# Patient Record
Sex: Female | Born: 1990 | Race: Black or African American | Hispanic: No | Marital: Single | Smoking: Former smoker
Health system: Southern US, Community
[De-identification: ages and names within clinical notes are randomized; demographics above are authoritative.]

## PROBLEM LIST (undated history)

## (undated) ENCOUNTER — Inpatient Hospital Stay (HOSPITAL_COMMUNITY): Payer: Self-pay

## (undated) ENCOUNTER — Emergency Department (HOSPITAL_COMMUNITY): Payer: Self-pay | Source: Home / Self Care

## (undated) DIAGNOSIS — Z789 Other specified health status: Secondary | ICD-10-CM

---
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
## 2010-01-05 ENCOUNTER — Emergency Department (HOSPITAL_COMMUNITY): Admission: EM | Admit: 2010-01-05 | Discharge: 2010-01-05 | Payer: Self-pay | Admitting: Family Medicine

## 2010-01-22 ENCOUNTER — Emergency Department (HOSPITAL_COMMUNITY): Admission: EM | Admit: 2010-01-22 | Discharge: 2010-01-22 | Payer: Self-pay | Admitting: Family Medicine

## 2010-03-02 ENCOUNTER — Emergency Department (HOSPITAL_COMMUNITY): Admission: EM | Admit: 2010-03-02 | Discharge: 2010-03-02 | Payer: Self-pay | Admitting: Family Medicine

## 2010-06-21 ENCOUNTER — Emergency Department (HOSPITAL_COMMUNITY)
Admission: EM | Admit: 2010-06-21 | Discharge: 2010-06-21 | Payer: Self-pay | Source: Home / Self Care | Admitting: Emergency Medicine

## 2010-07-19 ENCOUNTER — Emergency Department (HOSPITAL_COMMUNITY)
Admission: EM | Admit: 2010-07-19 | Discharge: 2010-07-19 | Payer: Self-pay | Source: Home / Self Care | Admitting: Emergency Medicine

## 2010-10-03 ENCOUNTER — Other Ambulatory Visit: Payer: Self-pay | Admitting: Family Medicine

## 2010-10-03 DIAGNOSIS — Z3689 Encounter for other specified antenatal screening: Secondary | ICD-10-CM

## 2010-10-03 LAB — URINALYSIS, ROUTINE W REFLEX MICROSCOPIC
Leukocytes, UA: NEGATIVE
Nitrite: NEGATIVE
Protein, ur: 30 mg/dL — AB
Urobilinogen, UA: 0.2 mg/dL (ref 0.0–1.0)

## 2010-10-03 LAB — URINE MICROSCOPIC-ADD ON

## 2010-10-03 LAB — ABO/RH: ABO/RH(D): A POS

## 2010-10-07 ENCOUNTER — Ambulatory Visit (HOSPITAL_COMMUNITY)
Admission: RE | Admit: 2010-10-07 | Discharge: 2010-10-07 | Disposition: A | Discharge status: Home / Self Care | Payer: Self-pay | Source: Ambulatory Visit | Attending: Family Medicine | Admitting: Family Medicine

## 2010-10-07 DIAGNOSIS — Z1389 Encounter for screening for other disorder: Secondary | ICD-10-CM | POA: Insufficient documentation

## 2010-10-07 DIAGNOSIS — O358XX0 Maternal care for other (suspected) fetal abnormality and damage, not applicable or unspecified: Secondary | ICD-10-CM | POA: Insufficient documentation

## 2010-10-07 DIAGNOSIS — Z363 Encounter for antenatal screening for malformations: Secondary | ICD-10-CM | POA: Insufficient documentation

## 2010-10-07 DIAGNOSIS — Z3689 Encounter for other specified antenatal screening: Secondary | ICD-10-CM

## 2010-10-07 LAB — POCT PREGNANCY, URINE: Preg Test, Ur: NEGATIVE

## 2010-10-09 LAB — URINE CULTURE

## 2010-10-09 LAB — POCT URINALYSIS DIP (DEVICE)
Bilirubin Urine: NEGATIVE
Glucose, UA: NEGATIVE mg/dL
Nitrite: NEGATIVE

## 2010-10-09 LAB — POCT RAPID STREP A (OFFICE): Streptococcus, Group A Screen (Direct): POSITIVE — AB

## 2010-10-26 LAB — POCT URINALYSIS DIP (DEVICE)
Bilirubin Urine: NEGATIVE
Ketones, ur: NEGATIVE mg/dL
Protein, ur: NEGATIVE mg/dL
Specific Gravity, Urine: 1.015 (ref 1.005–1.030)
pH: 7 (ref 5.0–8.0)

## 2010-10-26 LAB — POCT PREGNANCY, URINE: Preg Test, Ur: NEGATIVE

## 2011-12-29 IMAGING — US US OB DETAIL+14 WK
1 series · 14 of 28 positions shown · non-contrast
Comparison: none

[Series 1: us ob detail +14 wk · 58 acquisitions, 14 frames shown]
[im 3/58]
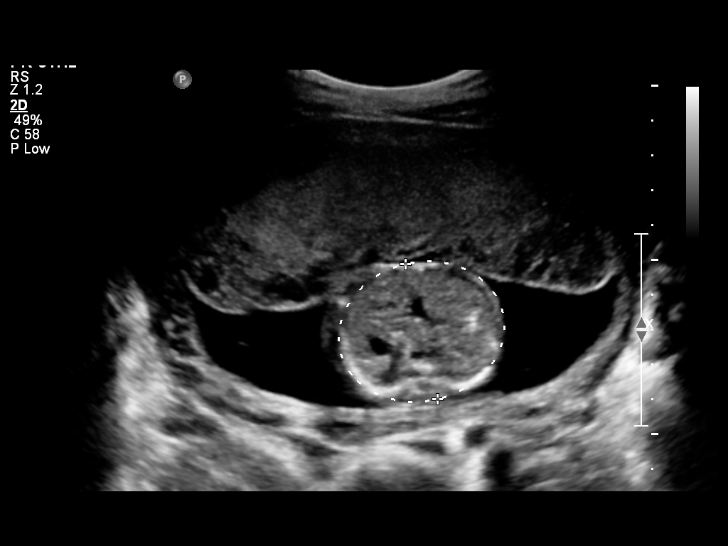
[im 7/58]
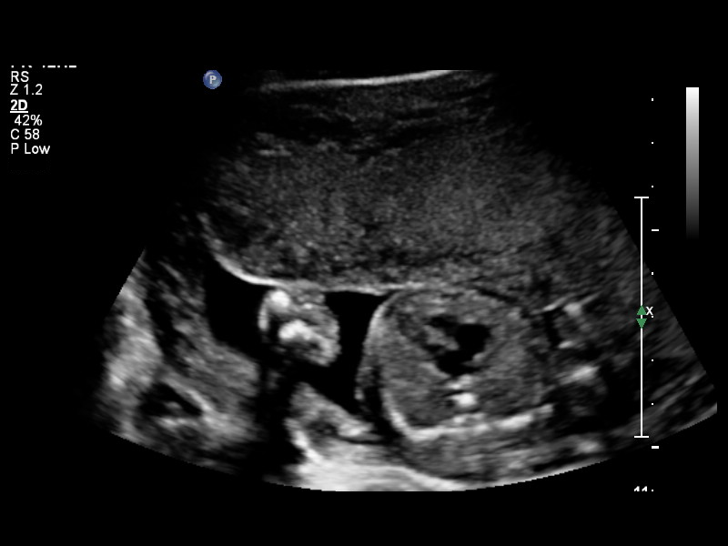
[im 11/58]
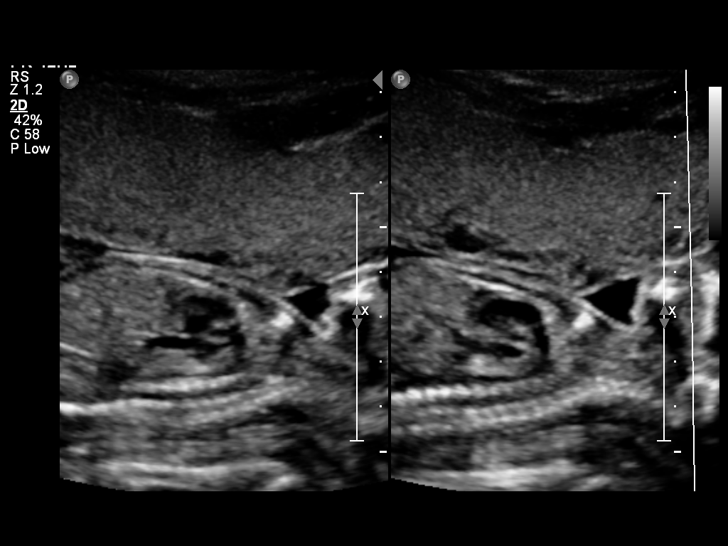
[im 15/58]
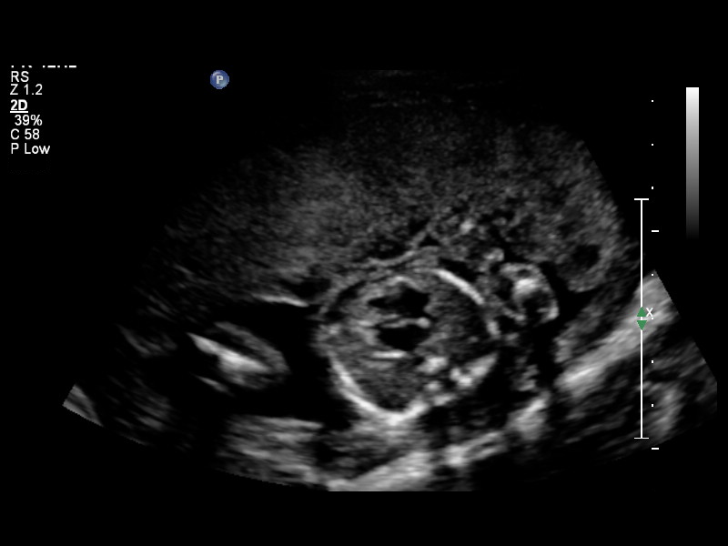
[im 20/58]
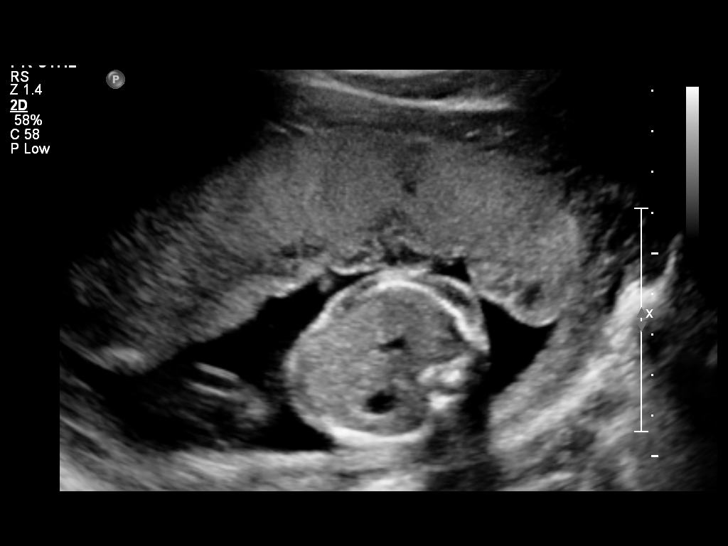
[im 24/58]
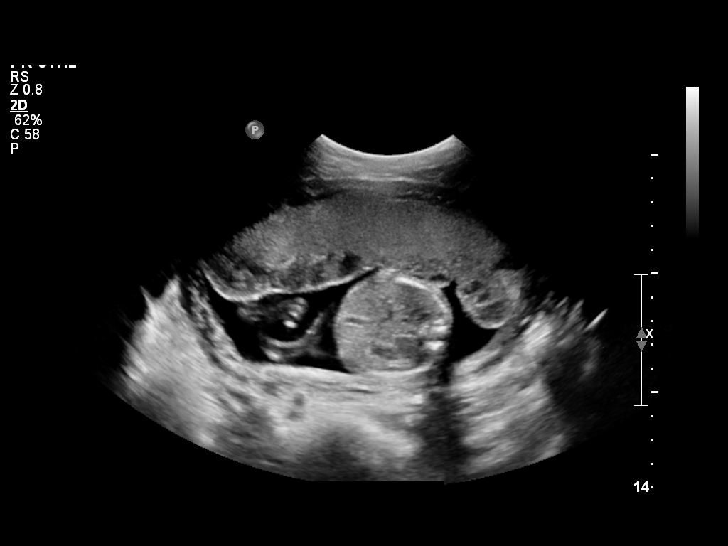
[im 28/58]
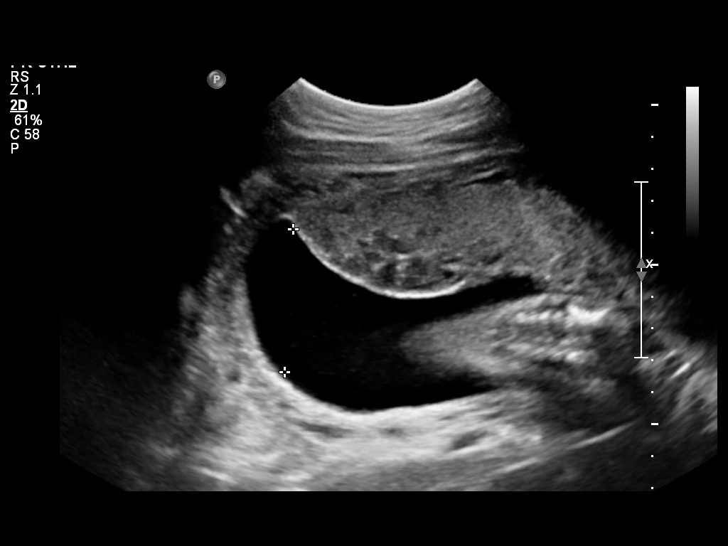
[im 32/58]
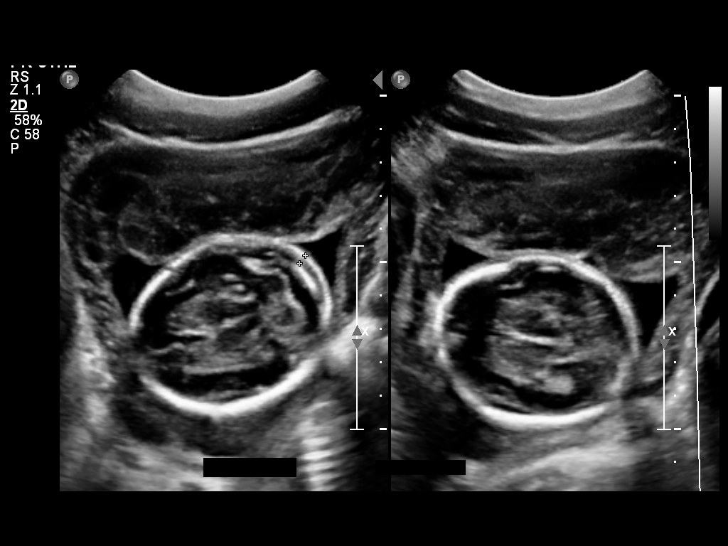
[im 36/58]
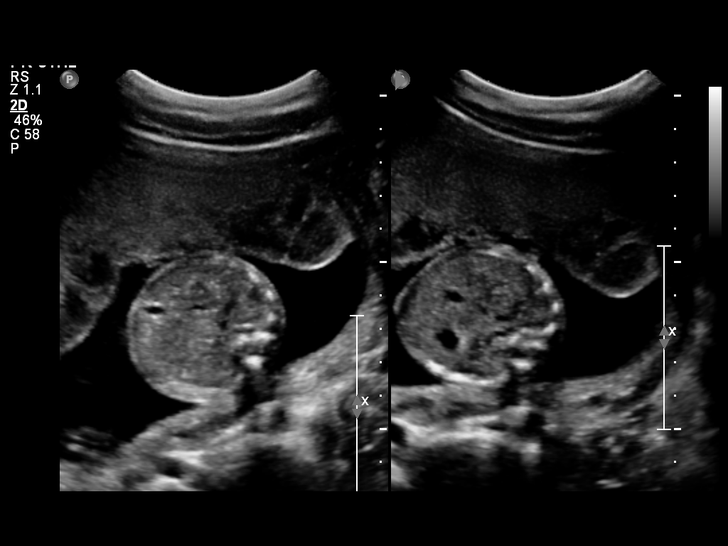
[im 41/58]
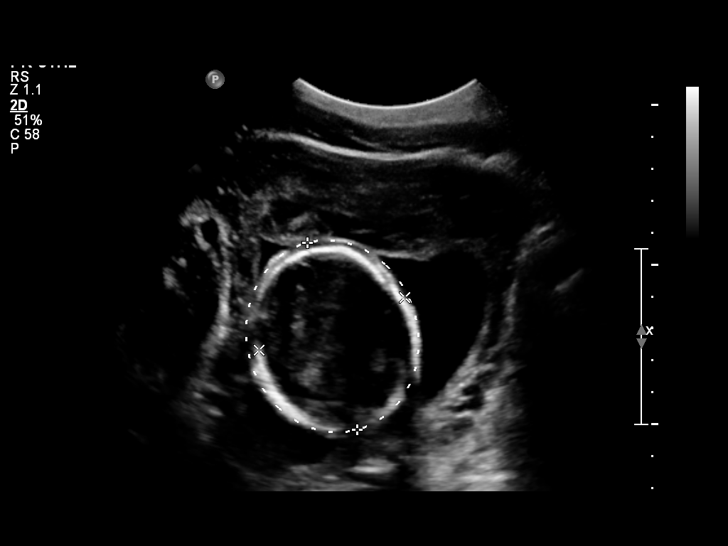
[im 45/58]
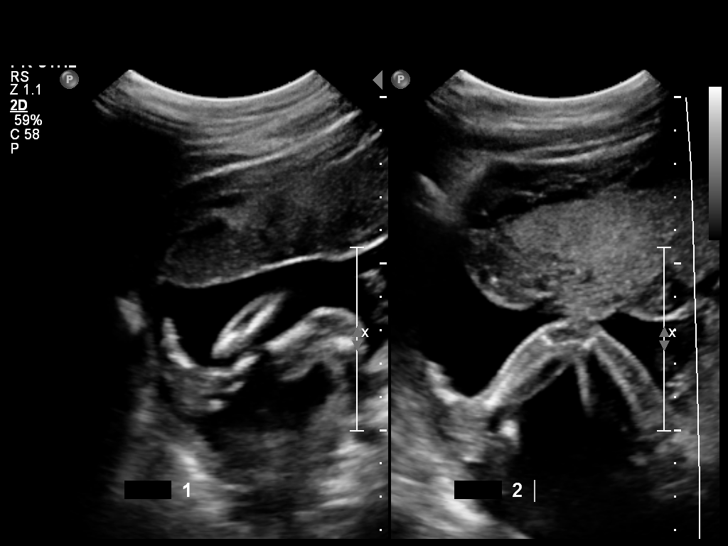
[im 49/58]
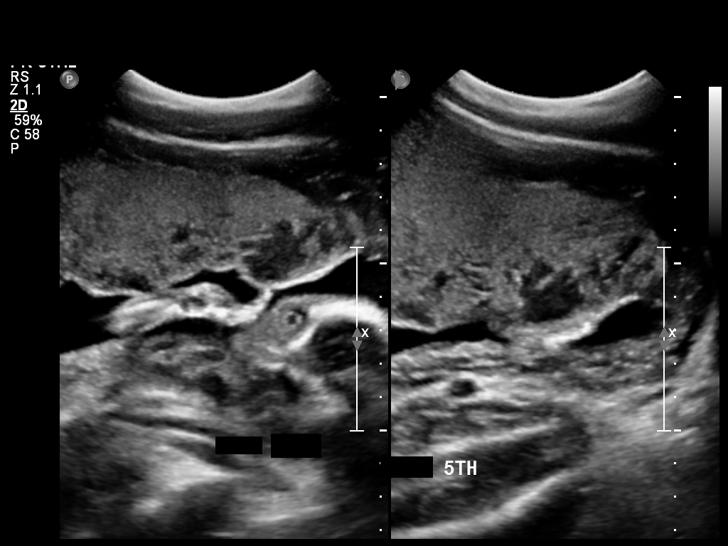
[im 53/58]
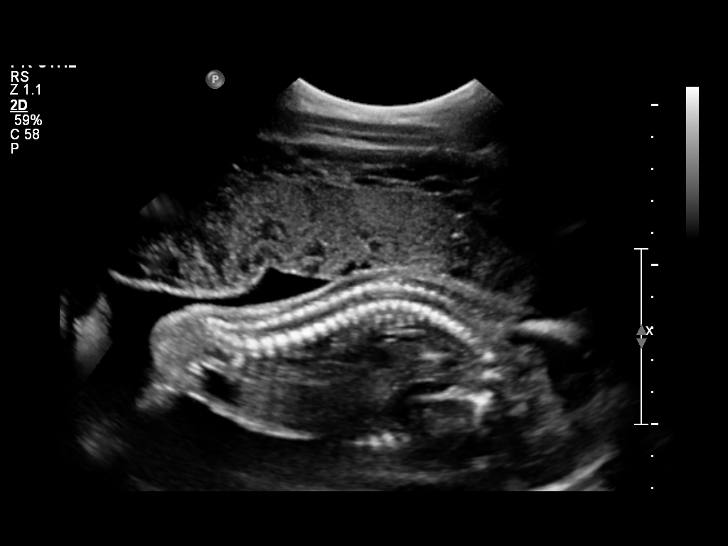
[im 58/58]
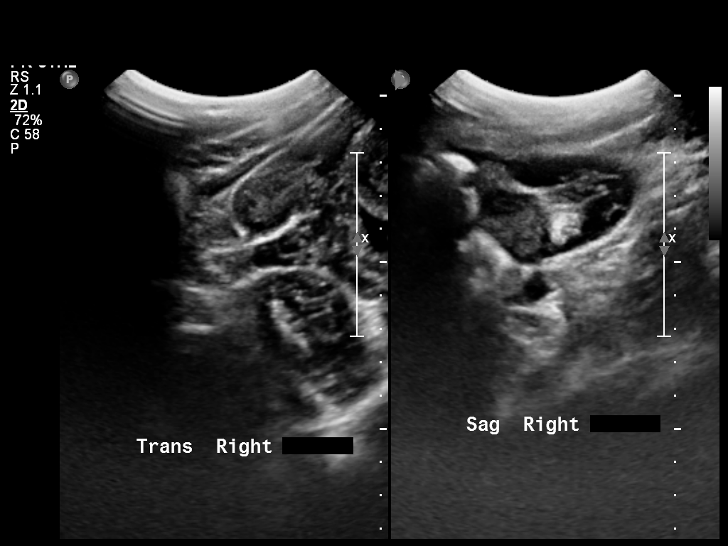

[14 of 28 positions shown; findings below may reference images not displayed]

Canned report from images found in remote index.

Refer to host system for actual result text.

## 2012-07-23 ENCOUNTER — Emergency Department (HOSPITAL_COMMUNITY): Payer: Self-pay

## 2012-07-23 ENCOUNTER — Emergency Department (HOSPITAL_COMMUNITY)
Admission: EM | Admit: 2012-07-23 | Discharge: 2012-07-24 | Disposition: A | Discharge status: Home / Self Care | Payer: Self-pay | Attending: Emergency Medicine | Admitting: Emergency Medicine

## 2012-07-23 ENCOUNTER — Encounter (HOSPITAL_COMMUNITY): Payer: Self-pay | Admitting: *Deleted

## 2012-07-23 DIAGNOSIS — S52121A Displaced fracture of head of right radius, initial encounter for closed fracture: Secondary | ICD-10-CM

## 2012-07-23 DIAGNOSIS — S52123A Displaced fracture of head of unspecified radius, initial encounter for closed fracture: Secondary | ICD-10-CM | POA: Insufficient documentation

## 2012-07-23 DIAGNOSIS — W19XXXA Unspecified fall, initial encounter: Secondary | ICD-10-CM | POA: Insufficient documentation

## 2012-07-23 DIAGNOSIS — Y929 Unspecified place or not applicable: Secondary | ICD-10-CM | POA: Insufficient documentation

## 2012-07-23 DIAGNOSIS — Y939 Activity, unspecified: Secondary | ICD-10-CM | POA: Insufficient documentation

## 2012-07-23 LAB — BASIC METABOLIC PANEL
Chloride: 104 mEq/L (ref 96–112)
Creatinine, Ser: 0.72 mg/dL (ref 0.50–1.10)
GFR calc Af Amer: >90 mL/min (ref >90)
Potassium: 3 mEq/L — ABNORMAL LOW (ref 3.5–5.1)
Sodium: 134 mEq/L — ABNORMAL LOW (ref 135–145)

## 2012-07-23 LAB — CBC
MCV: 87.9 fL (ref 78.0–100.0)
Platelets: 193 10*3/uL (ref 150–400)
RDW: 13.5 % (ref 11.5–15.5)
WBC: 8.7 10*3/uL (ref 4.0–10.5)

## 2012-07-23 MED ORDER — OXYCODONE-ACETAMINOPHEN 5-325 MG PO TABS: 1.0000 | ORAL_TABLET | ORAL | Status: DC | PRN

## 2012-07-23 MED ORDER — OXYCODONE-ACETAMINOPHEN 5-325 MG PO TABS
1.0000 | ORAL_TABLET | Freq: Once | ORAL | Status: AC
  Administered 2012-07-23: 1 via ORAL

## 2012-07-23 MED ORDER — INDOMETHACIN ER 75 MG PO CPCR: 75.0000 mg | ORAL_CAPSULE | Freq: Every day | ORAL | Status: DC

## 2012-07-23 MED ORDER — SODIUM CHLORIDE 0.9 % IV SOLN
INTRAVENOUS | Status: DC
  Administered 2012-07-23: 22:00:00 via INTRAVENOUS

## 2012-07-23 MED ORDER — MORPHINE SULFATE 4 MG/ML IJ SOLN
6.0000 mg | Freq: Once | INTRAMUSCULAR | Status: AC
  Administered 2012-07-23: 6 mg via INTRAVENOUS
  Filled 2012-07-23: qty 2

## 2012-07-23 MED ORDER — ONDANSETRON HCL 4 MG/2ML IJ SOLN
INTRAMUSCULAR | Status: AC
  Administered 2012-07-23: 4 mg via INTRAVENOUS
  Filled 2012-07-23: qty 2

## 2012-07-23 MED ORDER — OXYCODONE-ACETAMINOPHEN 5-325 MG PO TABS
ORAL_TABLET | ORAL | Status: AC
  Filled 2012-07-23: qty 1

## 2012-07-23 MED ORDER — ONDANSETRON HCL 4 MG/2ML IJ SOLN
4.0000 mg | Freq: Once | INTRAMUSCULAR | Status: AC
  Administered 2012-07-23: 4 mg via INTRAVENOUS

## 2012-07-23 NOTE — ED Provider Notes (Signed)
History     CSN: [id number]  Arrival date & time 07/23/12  4098   First MD Initiated Contact with Patient 07/23/12 2044      Chief Complaint  Patient presents with  . Elbow Injury    The history is provided by the patient.   patient reports slipping and falling back onto an outstretched right arm.  She reports pain to severe in her right elbow and right shoulder.  The majority of the pain is located in her right elbow.  She denies weakness or numbness of her upper extremity.  The pain is moderate to severe.  The pain is worsened by movement and palpation.  Nothing improves the pain except for isolation of the elbow.  No head injury  History reviewed. No pertinent past medical history.  History reviewed. No pertinent past surgical history.  History reviewed. No pertinent family history.  History  Substance Use Topics  . Smoking status: Not on file  . Smokeless tobacco: Not on file  . Alcohol Use: Not on file    OB History    Grav Para Term Preterm Abortions TAB SAB Ect Mult Living                  Review of Systems  All other systems reviewed and are negative.    Allergies  Review of patient's allergies indicates no known allergies.  Home Medications   Current Outpatient Rx  Name  Route  Sig  Dispense  Refill  . ACETAMINOPHEN 500 MG PO TABS   Oral   Take 1,000 mg by mouth every 6 (six) hours as needed.         . INDOMETHACIN ER 75 MG PO CPCR   Oral   Take 1 capsule (75 mg total) by mouth daily with breakfast.   15 capsule   0   . OXYCODONE-ACETAMINOPHEN 5-325 MG PO TABS   Oral   Take 1 tablet by mouth every 4 (four) hours as needed for pain.   40 tablet   0     BP 119/73  Pulse 88  Temp 98.2 F (36.8 C) (Oral)  Resp 18  SpO2 100%  LMP 07/17/2012  Physical Exam  Nursing note and vitals reviewed. Constitutional: She is oriented to person, place, and time. She appears well-developed and well-nourished. No distress.  HENT:  Head:  Normocephalic and atraumatic.  Eyes: EOM are normal.  Neck: Normal range of motion.  Cardiovascular: Normal rate, regular rhythm and normal heart sounds.   Pulmonary/Chest: Effort normal and breath sounds normal.  Abdominal: Soft. She exhibits no distension. There is no tenderness.  Musculoskeletal:       Significant swelling about her right elbow.  Normal right radial pulse.  Normal sensation in her right hand.  Normal grip strength in her right hand.  Significant pain in her right elbow with pronation and supination of her right hand.  Full range of motion right shoulder  Neurological: She is alert and oriented to person, place, and time.  Skin: Skin is warm and dry.  Psychiatric: She has a normal mood and affect. Judgment normal.    ED Course  Procedures (including critical care time)  Labs Reviewed  CBC - Abnormal; Notable for the following:    HCT 35.5 (*)     All other components within normal limits  BASIC METABOLIC PANEL - Abnormal; Notable for the following:    Sodium 134 (*)     Potassium 3.0 (*)  All other components within normal limits   Dg Shoulder Right  07/23/2012  *RADIOLOGY REPORT*  Clinical Data: Fall.  Shoulder injury and pain.  RIGHT SHOULDER - 2+ VIEW  Comparison:  None.  Findings:  There is no evidence of fracture or dislocation.  There is no evidence of arthropathy or other focal bone abnormality. Soft tissues are unremarkable.  IMPRESSION: Negative.   Original Report Authenticated By: Myles Rosenthal, M.D.    Dg Elbow Complete Right  07/23/2012  *RADIOLOGY REPORT*  Clinical Data: Fall.  Elbow injury and pain.  Comminuted fracture  RIGHT ELBOW - COMPLETE 3+ VIEW  Comparison: None.  Findings: Comminuted and displaced fracture is seen involving the radial head and neck.  No other fractures are identified.  Large elbow joint effusion noted.  IMPRESSION: Comminuted and displaced fracture of the radial head and neck.   Original Report Authenticated By: Myles Rosenthal, M.D.       1. Right radial head fracture       MDM  I spoke with the hand surgeon Dr. Amanda Pea personally evaluated the x-rays at home.  He requests the patient be placed in a splint and followup in his office in 2 days to discuss and likely schedule surgery for her right radial head fracture.  Requests a CT scan of her right elbow.  He also requests [place] and the patient home with oxycodone as well as indomethacin.  The patient understands the importance of following up with the hand surgeon and the need for surgery.  Her pain is controlled at this time.  Splint placed in the emergency department by orthopedic tech.        Lyanne Co, MD 07/23/12 [phone]

## 2012-07-23 NOTE — ED Notes (Signed)
Pt fell on R arm in her driveway. Pt c/o pain to R shoulder and R elbow. Pt states she fell this afternoon. CSMF intact to R arm.

## 2012-07-23 NOTE — ED Notes (Signed)
[id number]<WG> Expected date:<BR> Expected time:<BR> Means of arrival:<BR> Comments:<BR> Hold triage 5

## 2012-07-23 NOTE — ED Notes (Signed)
Ice pack provided in triage

## 2012-07-24 MED ORDER — MORPHINE SULFATE 4 MG/ML IJ SOLN
4.0000 mg | Freq: Once | INTRAMUSCULAR | Status: AC
  Administered 2012-07-24: 4 mg via INTRAVENOUS
  Filled 2012-07-24: qty 1

## 2012-07-24 NOTE — ED Notes (Signed)
Discharge instructions reviewed w/ pt., verbalizes understanding. Two prescriptions provided at discharge. 

## 2012-07-25 NOTE — Pre-Procedure Instructions (Addendum)
20 Renae Schonberger  07/25/2012   Your procedure is scheduled on:  Saturday July 27, 2012  Report to [facility] at 06:00 AM.  Call this number if you have problems the morning of surgery: [phone]   Remember:   Do not eat food or drink:After Midnight.    Take these medicines the morning of surgery with A SIP OF WATER: tylenol or percocet,    Do not wear jewelry, make-up or nail polish.  Do not wear lotions, powders, or perfumes.  Do not shave 48 hours prior to surgery. .  Do not bring valuables to the hospital.  Contacts, dentures or bridgework may not be worn into surgery.  Leave suitcase in the car. After surgery it may be brought to your room.  For patients admitted to the hospital, checkout time is 11:00 AM the day of discharge.   Patients discharged the day of surgery will not be allowed to drive home.  Name and phone number of your driver: family / friend  Special Instructions: Shower using CHG 2 nights before surgery and the night before surgery.  If you shower the day of surgery use CHG.  Use special wash - you have one bottle of CHG for all showers.  You should use approximately 1/3 of the bottle for each shower.   Please read over the following fact sheets that you were given: Pain Booklet, Coughing and Deep Breathing, Total Joint Packet and MRSA Information

## 2012-07-26 ENCOUNTER — Encounter (HOSPITAL_COMMUNITY): Payer: Self-pay | Admitting: Respiratory Therapy

## 2012-07-26 ENCOUNTER — Encounter (HOSPITAL_COMMUNITY)
Admission: RE | Admit: 2012-07-26 | Discharge: 2012-07-26 | Disposition: A | Discharge status: Home / Self Care | Payer: Self-pay | Source: Ambulatory Visit | Attending: Orthopedic Surgery | Admitting: Orthopedic Surgery

## 2012-07-26 ENCOUNTER — Encounter (HOSPITAL_COMMUNITY): Payer: Self-pay

## 2012-07-26 LAB — SURGICAL PCR SCREEN
MRSA, PCR: NEGATIVE
Staphylococcus aureus: NEGATIVE

## 2012-07-26 LAB — TYPE AND SCREEN: Antibody Screen: NEGATIVE

## 2012-07-26 NOTE — Progress Notes (Addendum)
Left message with Dr. Carlos Levering office and requested that orders be placed in computer. Per Revonda Standard, [place] will obtain T&S and will not repeat BMET

## 2012-07-27 ENCOUNTER — Encounter (HOSPITAL_COMMUNITY)
Admission: RE | Disposition: A | Discharge status: Home / Self Care | Payer: Self-pay | Source: Ambulatory Visit | Attending: Orthopedic Surgery

## 2012-07-27 ENCOUNTER — Encounter (HOSPITAL_COMMUNITY): Payer: Self-pay | Admitting: Anesthesiology

## 2012-07-27 ENCOUNTER — Ambulatory Visit (HOSPITAL_COMMUNITY): Payer: Self-pay

## 2012-07-27 ENCOUNTER — Ambulatory Visit (HOSPITAL_COMMUNITY): Payer: Self-pay | Admitting: Anesthesiology

## 2012-07-27 ENCOUNTER — Observation Stay (HOSPITAL_COMMUNITY)
Admission: RE | Admit: 2012-07-27 | Discharge: 2012-07-29 | Disposition: A | Discharge status: Home / Self Care | Payer: Self-pay | Source: Ambulatory Visit | Attending: Orthopedic Surgery | Admitting: Orthopedic Surgery

## 2012-07-27 DIAGNOSIS — Z01812 Encounter for preprocedural laboratory examination: Secondary | ICD-10-CM | POA: Insufficient documentation

## 2012-07-27 DIAGNOSIS — S53196A Other dislocation of unspecified ulnohumeral joint, initial encounter: Secondary | ICD-10-CM | POA: Insufficient documentation

## 2012-07-27 DIAGNOSIS — F17200 Nicotine dependence, unspecified, uncomplicated: Secondary | ICD-10-CM | POA: Insufficient documentation

## 2012-07-27 DIAGNOSIS — X58XXXA Exposure to other specified factors, initial encounter: Secondary | ICD-10-CM | POA: Insufficient documentation

## 2012-07-27 DIAGNOSIS — S52009A Unspecified fracture of upper end of unspecified ulna, initial encounter for closed fracture: Principal | ICD-10-CM | POA: Insufficient documentation

## 2012-07-27 HISTORY — PX: ORIF RADIAL FRACTURE: SHX5113

## 2012-07-27 LAB — CBC WITH DIFFERENTIAL/PLATELET
Basophils Absolute: 0 10*3/uL (ref 0.0–0.1)
Basophils Relative: 0 % (ref 0–1)
Eosinophils Absolute: 0.5 10*3/uL (ref 0.0–0.7)
Eosinophils Relative: 6 % — ABNORMAL HIGH (ref 0–5)
HCT: 36.4 % (ref 36.0–46.0)
Lymphocytes Relative: 10 % — ABNORMAL LOW (ref 12–46)
MCH: 30.2 pg (ref 26.0–34.0)
MCHC: 34.6 g/dL (ref 30.0–36.0)
MCV: 87.3 fL (ref 78.0–100.0)
Monocytes Absolute: 0.5 10*3/uL (ref 0.1–1.0)
Platelets: 196 10*3/uL (ref 150–400)
RDW: 13.3 % (ref 11.5–15.5)
WBC: 7.7 10*3/uL (ref 4.0–10.5)

## 2012-07-27 LAB — BASIC METABOLIC PANEL
CO2: 21 mEq/L (ref 19–32)
Calcium: 9 mg/dL (ref 8.4–10.5)
Creatinine, Ser: 0.78 mg/dL (ref 0.50–1.10)
GFR calc non Af Amer: >90 mL/min (ref >90)
Sodium: 134 mEq/L — ABNORMAL LOW (ref 135–145)

## 2012-07-27 LAB — URINALYSIS, ROUTINE W REFLEX MICROSCOPIC
Glucose, UA: NEGATIVE mg/dL
Ketones, ur: NEGATIVE mg/dL
Protein, ur: NEGATIVE mg/dL
Urobilinogen, UA: 0.2 mg/dL (ref 0.0–1.0)

## 2012-07-27 LAB — URINE MICROSCOPIC-ADD ON

## 2012-07-27 SURGERY — OPEN REDUCTION INTERNAL FIXATION (ORIF) RADIAL FRACTURE
Site: Elbow | Laterality: Right | Wound class: Clean

## 2012-07-27 MED ORDER — DEXTROSE 5 % IV SOLN
500.0000 mg | Freq: Four times a day (QID) | INTRAVENOUS | Status: DC | PRN
  Filled 2012-07-27: qty 5

## 2012-07-27 MED ORDER — ROCURONIUM BROMIDE 100 MG/10ML IV SOLN
INTRAVENOUS | Status: DC | PRN
  Administered 2012-07-27: 40 mg via INTRAVENOUS

## 2012-07-27 MED ORDER — HYDROMORPHONE HCL 2 MG PO TABS
2.0000 mg | ORAL_TABLET | ORAL | Status: DC | PRN
  Administered 2012-07-27 – 2012-07-29 (×9): 2 mg via ORAL
  Filled 2012-07-27 (×9): qty 1

## 2012-07-27 MED ORDER — CEFAZOLIN SODIUM-DEXTROSE 2-3 GM-% IV SOLR
INTRAVENOUS | Status: DC | PRN
  Administered 2012-07-27: 2 g via INTRAVENOUS

## 2012-07-27 MED ORDER — LACTATED RINGERS IV SOLN
INTRAVENOUS | Status: DC | PRN
  Administered 2012-07-27: 10:00:00 via INTRAVENOUS

## 2012-07-27 MED ORDER — BUPIVACAINE HCL (PF) 0.25 % IJ SOLN
INTRAMUSCULAR | Status: AC
  Filled 2012-07-27: qty 30

## 2012-07-27 MED ORDER — NEOSTIGMINE METHYLSULFATE 1 MG/ML IJ SOLN
INTRAMUSCULAR | Status: DC | PRN
  Administered 2012-07-27: 2 mg via INTRAVENOUS

## 2012-07-27 MED ORDER — CHLORHEXIDINE GLUCONATE 4 % EX LIQD: 60.0000 mL | Freq: Once | CUTANEOUS | Status: DC

## 2012-07-27 MED ORDER — ACETAMINOPHEN 325 MG PO TABS
650.0000 mg | ORAL_TABLET | ORAL | Status: DC | PRN
  Administered 2012-07-27 – 2012-07-28 (×3): 650 mg via ORAL
  Filled 2012-07-27 (×3): qty 2

## 2012-07-27 MED ORDER — METHOCARBAMOL 500 MG PO TABS
500.0000 mg | ORAL_TABLET | Freq: Four times a day (QID) | ORAL | Status: DC | PRN
  Administered 2012-07-27 – 2012-07-29 (×6): 500 mg via ORAL
  Filled 2012-07-27 (×7): qty 1

## 2012-07-27 MED ORDER — BUPIVACAINE HCL (PF) 0.25 % IJ SOLN
INTRAMUSCULAR | Status: DC | PRN
  Administered 2012-07-27: 15 mL

## 2012-07-27 MED ORDER — OXYCODONE HCL 5 MG PO TABS: 5.0000 mg | ORAL_TABLET | Freq: Once | ORAL | Status: DC | PRN

## 2012-07-27 MED ORDER — VITAMIN C 500 MG PO TABS
1000.0000 mg | ORAL_TABLET | Freq: Every day | ORAL | Status: DC
  Administered 2012-07-27 – 2012-07-29 (×3): 1000 mg via ORAL
  Filled 2012-07-27 (×3): qty 2

## 2012-07-27 MED ORDER — ONDANSETRON HCL 4 MG/2ML IJ SOLN
INTRAMUSCULAR | Status: DC | PRN
  Administered 2012-07-27: 4 mg via INTRAVENOUS

## 2012-07-27 MED ORDER — 0.9 % SODIUM CHLORIDE (POUR BTL) OPTIME
TOPICAL | Status: DC | PRN
  Administered 2012-07-27: 1000 mL

## 2012-07-27 MED ORDER — CEFAZOLIN SODIUM 1-5 GM-% IV SOLN
1.0000 g | Freq: Three times a day (TID) | INTRAVENOUS | Status: DC
  Administered 2012-07-27 – 2012-07-29 (×5): 1 g via INTRAVENOUS
  Filled 2012-07-27 (×8): qty 50

## 2012-07-27 MED ORDER — FAMOTIDINE 20 MG PO TABS
20.0000 mg | ORAL_TABLET | Freq: Two times a day (BID) | ORAL | Status: DC | PRN
  Administered 2012-07-28: 20 mg via ORAL
  Filled 2012-07-27 (×2): qty 1

## 2012-07-27 MED ORDER — SENNA 8.6 MG PO TABS
1.0000 | ORAL_TABLET | Freq: Two times a day (BID) | ORAL | Status: DC
  Administered 2012-07-27 – 2012-07-29 (×4): 8.6 mg via ORAL
  Filled 2012-07-27 (×6): qty 1

## 2012-07-27 MED ORDER — ONDANSETRON HCL 4 MG PO TABS
4.0000 mg | ORAL_TABLET | Freq: Four times a day (QID) | ORAL | Status: DC | PRN
  Administered 2012-07-28: 4 mg via ORAL
  Filled 2012-07-27: qty 1

## 2012-07-27 MED ORDER — MIDAZOLAM HCL 2 MG/2ML IJ SOLN
INTRAMUSCULAR | Status: AC
  Filled 2012-07-27: qty 2

## 2012-07-27 MED ORDER — SODIUM CHLORIDE 0.45 % IV SOLN
INTRAVENOUS | Status: DC
  Administered 2012-07-27: 14:00:00 via INTRAVENOUS

## 2012-07-27 MED ORDER — ROPIVACAINE HCL 5 MG/ML IJ SOLN
INTRAMUSCULAR | Status: DC | PRN
  Administered 2012-07-27: 25 mL

## 2012-07-27 MED ORDER — OXYCODONE HCL 5 MG/5ML PO SOLN: 5.0000 mg | Freq: Once | ORAL | Status: DC | PRN

## 2012-07-27 MED ORDER — ALPRAZOLAM 0.5 MG PO TABS
0.5000 mg | ORAL_TABLET | Freq: Four times a day (QID) | ORAL | Status: DC | PRN
  Administered 2012-07-27 – 2012-07-28 (×2): 0.5 mg via ORAL
  Filled 2012-07-27 (×2): qty 1

## 2012-07-27 MED ORDER — PROPOFOL 10 MG/ML IV BOLUS
INTRAVENOUS | Status: DC | PRN
  Administered 2012-07-27: 150 mg via INTRAVENOUS

## 2012-07-27 MED ORDER — INDOMETHACIN ER 75 MG PO CPCR
75.0000 mg | ORAL_CAPSULE | Freq: Every day | ORAL | Status: DC
  Administered 2012-07-28 – 2012-07-29 (×2): 75 mg via ORAL
  Filled 2012-07-27 (×3): qty 1

## 2012-07-27 MED ORDER — PROMETHAZINE HCL 25 MG/ML IJ SOLN: 6.2500 mg | INTRAMUSCULAR | Status: DC | PRN

## 2012-07-27 MED ORDER — ONDANSETRON HCL 4 MG/2ML IJ SOLN
4.0000 mg | Freq: Four times a day (QID) | INTRAMUSCULAR | Status: DC | PRN
  Administered 2012-07-27: 4 mg via INTRAVENOUS
  Filled 2012-07-27: qty 2

## 2012-07-27 MED ORDER — MORPHINE SULFATE 2 MG/ML IJ SOLN
1.0000 mg | INTRAMUSCULAR | Status: DC | PRN
  Administered 2012-07-27 (×2): 1 mg via INTRAVENOUS
  Filled 2012-07-27 (×2): qty 1

## 2012-07-27 MED ORDER — LIDOCAINE HCL (PF) 1 % IJ SOLN
INTRAMUSCULAR | Status: AC
  Filled 2012-07-27: qty 5

## 2012-07-27 MED ORDER — MEPERIDINE HCL 25 MG/ML IJ SOLN: 6.2500 mg | INTRAMUSCULAR | Status: DC | PRN

## 2012-07-27 MED ORDER — HYDROMORPHONE HCL PF 1 MG/ML IJ SOLN
0.5000 mg | INTRAMUSCULAR | Status: DC | PRN
  Administered 2012-07-28 (×4): 1 mg via INTRAVENOUS
  Filled 2012-07-27 (×4): qty 1

## 2012-07-27 MED ORDER — OXYCODONE HCL 5 MG PO TABS
5.0000 mg | ORAL_TABLET | ORAL | Status: DC | PRN
  Administered 2012-07-27 – 2012-07-28 (×4): 5 mg via ORAL
  Filled 2012-07-27 (×2): qty 1
  Filled 2012-07-27: qty 2
  Filled 2012-07-27 (×2): qty 1

## 2012-07-27 MED ORDER — HYDROMORPHONE HCL PF 1 MG/ML IJ SOLN: 0.2500 mg | INTRAMUSCULAR | Status: DC | PRN

## 2012-07-27 MED ORDER — FENTANYL CITRATE 0.05 MG/ML IJ SOLN
INTRAMUSCULAR | Status: DC | PRN
  Administered 2012-07-27: 50 ug via INTRAVENOUS
  Administered 2012-07-27: 100 ug via INTRAVENOUS

## 2012-07-27 MED ORDER — GLYCOPYRROLATE 0.2 MG/ML IJ SOLN
INTRAMUSCULAR | Status: DC | PRN
  Administered 2012-07-27: .4 mg via INTRAVENOUS

## 2012-07-27 MED ORDER — CEFAZOLIN SODIUM 1-5 GM-% IV SOLN
1.0000 g | INTRAVENOUS | Status: AC
  Administered 2012-07-27: 1 g via INTRAVENOUS
  Filled 2012-07-27: qty 50

## 2012-07-27 MED ORDER — FENTANYL CITRATE 0.05 MG/ML IJ SOLN
INTRAMUSCULAR | Status: AC
  Filled 2012-07-27: qty 2

## 2012-07-27 MED ORDER — MIDAZOLAM HCL 5 MG/5ML IJ SOLN
INTRAMUSCULAR | Status: DC | PRN
  Administered 2012-07-27 (×2): 1 mg via INTRAVENOUS

## 2012-07-27 MED ORDER — CEFAZOLIN SODIUM-DEXTROSE 2-3 GM-% IV SOLR
INTRAVENOUS | Status: AC
  Filled 2012-07-27: qty 50

## 2012-07-27 MED ORDER — LIDOCAINE HCL (CARDIAC) 20 MG/ML IV SOLN
INTRAVENOUS | Status: DC | PRN
  Administered 2012-07-27: 50 mg via INTRAVENOUS

## 2012-07-27 MED ORDER — SODIUM CHLORIDE 0.45 % IV SOLN
INTRAVENOUS | Status: DC
  Administered 2012-07-27: 09:00:00 via INTRAVENOUS

## 2012-07-27 SURGICAL SUPPLY — 73 items
ANCHOR JUGGERKNOT SHT 1.4 SOFT (Anchor) ×1 IMPLANT
BANDAGE ELASTIC 4 VELCRO ST LF (GAUZE/BANDAGES/DRESSINGS) ×4 IMPLANT
BANDAGE GAUZE ELAST BULKY 4 IN (GAUZE/BANDAGES/DRESSINGS) ×3 IMPLANT
BLADE LONG MED 31X9 (MISCELLANEOUS) IMPLANT
BLADE SURG 10 STRL SS (BLADE) ×3 IMPLANT
BLADE SURG 15 STRL LF DISP TIS (BLADE) ×2 IMPLANT
BLADE SURG 15 STRL SS (BLADE) ×2
BNDG CMPR 9X4 STRL LF SNTH (GAUZE/BANDAGES/DRESSINGS) ×1
BNDG ESMARK 4X9 LF (GAUZE/BANDAGES/DRESSINGS) ×2 IMPLANT
BUR ROUND FLUTED 4 SOFT TCH (BURR) IMPLANT
CLOTH BEACON ORANGE TIMEOUT ST (SAFETY) ×2 IMPLANT
CLSR STERI-STRIP ANTIMIC 1/2X4 (GAUZE/BANDAGES/DRESSINGS) ×1 IMPLANT
CORDS BIPOLAR (ELECTRODE) ×2 IMPLANT
COVER SURGICAL LIGHT HANDLE (MISCELLANEOUS) ×2 IMPLANT
CUFF TOURNIQUET SINGLE 18IN (TOURNIQUET CUFF) ×2 IMPLANT
CUFF TOURNIQUET SINGLE 24IN (TOURNIQUET CUFF) IMPLANT
DRAPE INCISE IOBAN 66X45 STRL (DRAPES) ×2 IMPLANT
DRAPE OEC MINIVIEW 54X84 (DRAPES) ×1 IMPLANT
DRAPE SURG 17X23 STRL (DRAPES) ×2 IMPLANT
DRSG ADAPTIC 3X8 NADH LF (GAUZE/BANDAGES/DRESSINGS) ×2 IMPLANT
ELECT REM PT RETURN 9FT ADLT (ELECTROSURGICAL) ×2
ELECTRODE REM PT RTRN 9FT ADLT (ELECTROSURGICAL) ×1 IMPLANT
EVACUATOR 1/8 PVC DRAIN (DRAIN) IMPLANT
GLOVE BIOGEL M STRL SZ7.5 (GLOVE) ×2 IMPLANT
GLOVE SURG ORTHO 8.0 STRL STRW (GLOVE) ×2 IMPLANT
GOWN PREVENTION PLUS XLARGE (GOWN DISPOSABLE) ×3 IMPLANT
GOWN STRL NON-REIN LRG LVL3 (GOWN DISPOSABLE) ×4 IMPLANT
GOWN STRL REIN XL XLG (GOWN DISPOSABLE) ×4 IMPLANT
HANDPIECE INTERPULSE COAX TIP (DISPOSABLE)
HEAD RADIAL 10X20 (Head) ×1 IMPLANT
JUGGERKNOT SHORT SET 1.4MM (KITS) ×1 IMPLANT
KIT BASIN OR (CUSTOM PROCEDURE TRAY) ×2 IMPLANT
KIT ROOM TURNOVER OR (KITS) ×2 IMPLANT
LOOP VESSEL MAXI BLUE (MISCELLANEOUS) ×1 IMPLANT
MANIFOLD NEPTUNE II (INSTRUMENTS) ×2 IMPLANT
NDL HYPO 25GX1X1/2 BEV (NEEDLE) ×1 IMPLANT
NDL STRAIGHT KEITH (NEEDLE) ×1 IMPLANT
NEEDLE HYPO 25GX1X1/2 BEV (NEEDLE) ×2 IMPLANT
NEEDLE STRAIGHT KEITH (NEEDLE) IMPLANT
NS IRRIG 1000ML POUR BTL (IV SOLUTION) ×2 IMPLANT
PACK ORTHO EXTREMITY (CUSTOM PROCEDURE TRAY) ×2 IMPLANT
PAD ARMBOARD 7.5X6 YLW CONV (MISCELLANEOUS) ×4 IMPLANT
PAD CAST 4YDX4 CTTN HI CHSV (CAST SUPPLIES) ×2 IMPLANT
PADDING CAST ABS 4INX4YD NS (CAST SUPPLIES) ×1
PADDING CAST ABS COTTON 4X4 ST (CAST SUPPLIES) IMPLANT
PADDING CAST COTTON 4X4 STRL (CAST SUPPLIES) ×4
PASSER SUT SWANSON 36MM LOOP (INSTRUMENTS) ×2 IMPLANT
SET HNDPC FAN SPRY TIP SCT (DISPOSABLE) IMPLANT
SPLINT FIBERGLASS 3X35 (CAST SUPPLIES) ×1 IMPLANT
SPONGE GAUZE 4X4 12PLY (GAUZE/BANDAGES/DRESSINGS) ×2 IMPLANT
SPONGE LAP 4X18 X RAY DECT (DISPOSABLE) ×2 IMPLANT
SPONGE SCRUB IODOPHOR (GAUZE/BANDAGES/DRESSINGS) ×2 IMPLANT
STAPLER VISISTAT 35W (STAPLE) IMPLANT
STEM IMPLANT W SCREW (Stem) ×1 IMPLANT
SUCTION FRAZIER TIP 10 FR DISP (SUCTIONS) ×2 IMPLANT
SUT BONE WAX W31G (SUTURE) ×1 IMPLANT
SUT ETHIBOND NAB CT1 #1 30IN (SUTURE) IMPLANT
SUT ETHILON 4 0 FS 1 (SUTURE) ×1 IMPLANT
SUT FIBERWIRE #2 38 REV NDL BL (SUTURE)
SUT PROLENE 4 0 PS 2 18 (SUTURE) ×1 IMPLANT
SUT VIC AB 3-0 PS1 18 (SUTURE) ×2
SUT VIC AB 3-0 PS1 18XBRD (SUTURE) ×1 IMPLANT
SUT VICRYL 0 CT 1 36IN (SUTURE) ×1 IMPLANT
SUTURE FIBERWR#2 38 REV NDL BL (SUTURE) ×2 IMPLANT
SYR 3ML LL SCALE MARK (SYRINGE) ×2 IMPLANT
SYR CONTROL 10ML LL (SYRINGE) ×2 IMPLANT
TOWEL OR 17X24 6PK STRL BLUE (TOWEL DISPOSABLE) ×2 IMPLANT
TOWEL OR 17X26 10 PK STRL BLUE (TOWEL DISPOSABLE) ×4 IMPLANT
TOWER CARTRIDGE SMART MIX (DISPOSABLE) ×1 IMPLANT
TRAY FOLEY CATH 14FR (SET/KITS/TRAYS/PACK) IMPLANT
TUBE CONNECTING 12X1/4 (SUCTIONS) ×2 IMPLANT
UNDERPAD 30X30 INCONTINENT (UNDERPADS AND DIAPERS) ×2 IMPLANT
WATER STERILE IRR 1000ML POUR (IV SOLUTION) ×2 IMPLANT

## 2012-07-27 NOTE — Preoperative (Signed)
Beta Blockers   Reason not to administer Beta Blockers: not prescribed 

## 2012-07-27 NOTE — H&P (Signed)
Theresa Finley is an 22 y.o. female.   Chief Complaint: right elbow fracture dislocation HPI: Patient presents with a comminuted radial head fracture. Marland Kitchen.Patient presents for evaluation and treatment of the of their upper extremity predicament. The patient denies neck back chest or of abdominal pain. The patient notes that they have no lower extremity problems. The patient from primarily complains of the upper extremity pain noted. I have discussed with the patient all issues and plan for the extremity  No past medical history on file.  No past surgical history on file.  No family history on file. Social History:  reports that she has been smoking.  She does not have any smokeless tobacco history on file. She reports that she does not drink alcohol or use illicit drugs.  Allergies: No Known Allergies  No prescriptions prior to admission    Results for orders placed during the hospital encounter of 07/26/12 (from the past 48 hour(s))  SURGICAL PCR SCREEN     Status: Normal   Collection Time   07/26/12 11:39 AM      Component Value Range Comment   MRSA, PCR NEGATIVE  NEGATIVE    Staphylococcus aureus NEGATIVE  NEGATIVE   HCG, SERUM, QUALITATIVE     Status: Normal   Collection Time   07/26/12 11:39 AM      Component Value Range Comment   Preg, Serum NEGATIVE  NEGATIVE   TYPE AND SCREEN     Status: Normal   Collection Time   07/26/12 11:45 AM      Component Value Range Comment   ABO/RH(D) A POS      Antibody Screen NEG      Sample Expiration 08/09/2012      No results found.  Review of Systems  HENT: Negative.   Eyes: Negative.   Respiratory: Negative.   Cardiovascular: Negative.   Gastrointestinal: Negative.   Genitourinary: Negative.   Skin: Negative.   Neurological: Negative.   Endo/Heme/Allergies: Negative.     Last menstrual period 07/17/2012. Physical Exam  ..The patient is alert and oriented in no acute distress the patient complains of pain in the affected upper  extremity. The patient is noted to have a normal HEENT exam. Lung fields show equal chest expansion and no shortness of breath abdomen exam is nontender without distention. Lower extremity examination does not show any fracture dislocation or blood clot symptoms. Pelvis is stable neck and back are stable and nontender Right elbow fracture with STS and intact skin  Nl pulse and LT sensation Assessment/Plan .Marland KitchenWe are planning surgery for your upper extremity. The risk and benefits of surgery include risk of bleeding infection anesthesia damage to normal structures and failure of the surgery to accomplish its intended goals of relieving symptoms and restoring function with this in mind we'll going to proceed. I have specifically discussed with the patient the pre-and postoperative regime and the does and don'ts and risk and benefits in great detail. Risk and benefits of surgery also include risk of dystrophy chronic nerve pain failure of the healing process to go onto completion and other inherent risks of surgery The relavent the pathophysiology of the disease/injury process, as well as the alternatives for treatment and postoperative course of action has been discussed in great detail with the patient who desires to proceed.  We will do everything in our power to help you (the patient) restore function to the upper extremity. Is a pleasure to see this patient today.  Given the displacement and disarray  will plan for radial head Aplasty and ligament repair as necessary  Theresa Finley,Theresa Finley 07/27/2012, 1:01 AM

## 2012-07-27 NOTE — Op Note (Signed)
See dictation [id number] Dominica Severin MD

## 2012-07-27 NOTE — Anesthesia Postprocedure Evaluation (Signed)
  Anesthesia Post-op Note  Patient: Theresa Finley  Procedure(s) Performed: Procedure(s) (LRB) with comments: OPEN REDUCTION INTERNAL FIXATION (ORIF) RADIAL FRACTURE (Right) - ORIF Right Radial Head Fracture  Patient Location: PACU  Anesthesia Type:General and GA combined with regional for post-op pain  Level of Consciousness: awake  Airway and Oxygen Therapy: Patient Spontanous Breathing  Post-op Pain: none  Post-op Assessment: Post-op Vital signs reviewed  Post-op Vital Signs: stable  Complications: No apparent anesthesia complications

## 2012-07-27 NOTE — Transfer of Care (Signed)
Immediate Anesthesia Transfer of Care Note  Patient: Theresa Finley  Procedure(s) Performed: Procedure(s) (LRB) with comments: OPEN REDUCTION INTERNAL FIXATION (ORIF) RADIAL FRACTURE (Right) - ORIF Right Radial Head Fracture  Patient Location: PACU  Anesthesia Type:General  Level of Consciousness: awake, alert  and oriented  Airway & Oxygen Therapy: Patient Spontanous Breathing and Patient connected to nasal cannula oxygen  Post-op Assessment: Report given to PACU RN and Post -op Vital signs reviewed and stable  Post vital signs: Reviewed and stable  Complications: No apparent anesthesia complications

## 2012-07-27 NOTE — Anesthesia Procedure Notes (Addendum)
Anesthesia Regional Block:  Supraclavicular block  Pre-Anesthetic Checklist: ,, timeout performed, Correct Patient, Correct Site, Correct Laterality, Correct Procedure, Correct Position, site marked, Risks and benefits discussed, Surgical consent, post-op pain management  Laterality: Right and Upper  Prep: chloraprep       Needles:   Needle Type: Echogenic Needle      Needle Gauge: 22 and 22 G    Additional Needles:  Procedures: ultrasound guided (picture in chart) and nerve stimulator Supraclavicular block Narrative:  Start time: 07/27/2012 9:58 AM End time: 07/27/2012 10:12 AM Injection made incrementally with aspirations every 5 mL. Anesthesiologist: T Maqssagee  Additional Notes: Tolerated well   Procedure Name: Intubation Date/Time: 07/27/2012 10:50 AM Performed by: Gwenyth Allegra Pre-anesthesia Checklist: Emergency Drugs available, Patient identified, Timeout performed, Suction available and Patient being monitored Patient Re-evaluated:Patient Re-evaluated prior to inductionOxygen Delivery Method: Circle system utilized Preoxygenation: Pre-oxygenation with 100% oxygen Intubation Type: IV induction Ventilation: Mask ventilation without difficulty Laryngoscope Size: Mac Grade View: Grade I Tube type: Oral Tube size: 7.0 mm Number of attempts: 1 Airway Equipment and Method: Stylet Placement Confirmation: ETT inserted through vocal cords under direct vision,  breath sounds checked- equal and bilateral and positive ETCO2 Secured at: 21 cm Tube secured with: Tape Dental Injury: Teeth and Oropharynx as per pre-operative assessment     Anesthesia Regional Block:   Narrative:

## 2012-07-27 NOTE — Anesthesia Preprocedure Evaluation (Signed)
Anesthesia Evaluation  Patient identified by MRN, date of birth, ID band Patient awake    Reviewed: Allergy & Precautions, H&P , NPO status , Patient's Chart, lab work & pertinent test results  History of Anesthesia Complications Negative for: history of anesthetic complications  Airway Mallampati: I      Dental No notable dental hx. (+) Teeth Intact   Pulmonary neg pulmonary ROS,  breath sounds clear to auscultation  Pulmonary exam normal       Cardiovascular negative cardio ROS  IRhythm:regular Rate:Normal     Neuro/Psych negative neurological ROS  negative psych ROS   GI/Hepatic negative GI ROS, Neg liver ROS,   Endo/Other  negative endocrine ROS  Renal/GU negative Renal ROS  negative genitourinary   Musculoskeletal   Abdominal   Peds  Hematology negative hematology ROS (+)   Anesthesia Other Findings   Reproductive/Obstetrics negative OB ROS                           Anesthesia Physical Anesthesia Plan  ASA: I  Anesthesia Plan: General and General ETT   Post-op Pain Management:    Induction:   Airway Management Planned:   Additional Equipment:   Intra-op Plan:   Post-operative Plan:   Informed Consent: I have reviewed the patients History and Physical, chart, labs and discussed the procedure including the risks, benefits and alternatives for the proposed anesthesia with the patient or authorized representative who has indicated his/her understanding and acceptance.     Plan Discussed with: CRNA and Surgeon  Anesthesia Plan Comments:         Anesthesia Quick Evaluation

## 2012-07-27 NOTE — Progress Notes (Signed)
PT Cancellation Note  Patient Details Name: Theresa Finley MRN: [id number] DOB: 15-Dec-1990   Cancelled Treatment:    Reason Eval/Treat Not Completed: Pain limiting ability to participate, will check back tomorrow.   Ora Mcnatt, [place] 07/27/2012, 4:01 PM

## 2012-07-27 NOTE — Progress Notes (Signed)
Pt temp 101.3.  States does not feel bad but possibly coming down with a cold.  Informed Brian(PA for Dr. Amanda Pea) and orders received.

## 2012-07-28 ENCOUNTER — Encounter (HOSPITAL_COMMUNITY): Payer: Self-pay | Admitting: Orthopedic Surgery

## 2012-07-28 LAB — URINE CULTURE
Colony Count: NO GROWTH
Culture: NO GROWTH

## 2012-07-28 MED ORDER — INFLUENZA VIRUS VACC SPLIT PF IM SUSP
0.5000 mL | INTRAMUSCULAR | Status: AC
  Administered 2012-07-29: 0.5 mL via INTRAMUSCULAR
  Filled 2012-07-28: qty 0.5

## 2012-07-28 NOTE — Progress Notes (Signed)
Subjective: 1 Day Post-Op Procedure(s) (LRB): OPEN REDUCTION INTERNAL FIXATION (ORIF) RADIAL FRACTURE (Right) Patient reports pain as moderate.   She is resting comfortably. Her pain issue is been addressed with changing her by mouth pain medicine to do a lot in which appears to be helping. She is tolerating a diet but has not eaten much. She has voided. Objective: Vital signs in last 24 hours: Temp:  [98.8 F (37.1 C)-102.6 F (39.2 C)] 99.1 F (37.3 C) (01/05 0627) Pulse Rate:  [80-102] 80  (01/05 0627) Resp:  [18-22] 18  (01/05 0627) BP: (103-118)/(45-64) 117/57 mmHg (01/05 0627) SpO2:  [97 %-100 %] 97 % (01/05 0627) Weight:  [59.875 kg (132 lb)] 59.875 kg (132 lb) (01/05 0043)  Intake/Output from previous day: 01/04 0701 - 01/05 0700 In: 1300 [P.O.:600; I.V.:700] Out: 600 [Urine:600] Intake/Output this shift:     Basename 07/27/12 0819  HGB 12.6    Basename 07/27/12 0819  WBC 7.7  RBC 4.17  HCT 36.4  PLT 196    Basename 07/27/12 0819  NA 134*  K 3.7  CL 101  CO2 21  BUN 8  CREATININE 0.78  GLUCOSE 95  CALCIUM 9.0   No results found for this basename: LABPT:2,INR:2 in the last 72 hours Physical exam: Patient is alert and oriented she is complaining of some degree of pain but is [place] to move her hand nicely. She moves her lower extremities well she moves her left arm well she has clear chest expansion and normal HEENT exam. There is no obvious complications today. Her bandages clean and dry she can move her fingers and maintains excellent radial median and ulnar nerve function. Neurologically intact ABD soft Neurovascular intact Sensation intact distally Intact pulses distally No cellulitis present Compartment soft  Assessment/Plan: 1 Day Post-Op Procedure(s) (LRB): OPEN REDUCTION INTERNAL FIXATION (ORIF) RADIAL FRACTURE (Right) Will continue IV antibiotics. We will continue observation. Her compartments are soft and she looks quite well overall. I  discussed with her range of motion, elevation, ice and other issues. Overall I think she looks well in terms of her pulmonary function. She has equal chest expansion and clear lung fields without complications. She denies fever. She is moving her finger and hand well.  I discussed with her meaningful expectations in terms of the pain and our plans for the future.  She has a supportive significant other with her and I discussed the plans for today and for the future at length with him. Karen Chafe 07/28/2012, 12:51 PM

## 2012-07-28 NOTE — Progress Notes (Signed)
Utilization Review Completed.[name] T1/11/2012   

## 2012-07-28 NOTE — Progress Notes (Signed)
Orthopedic Tech Progress Note Patient Details:  Theresa Finley 08-22-1990 [id number] Mission sling applied and suspended to Right arm with care directions to friend. Application tolerated well. Pillow proper under right arm to provide extra support. Patient stated she was comfortable and arm felt better this way.  Ortho Devices Type of Ortho Device: Other (comment) (Mission Sling) Ortho Device/Splint Location: Right Ortho Device/Splint Interventions: Application   Asia R Thompson 07/28/2012, 1:10 PM

## 2012-07-28 NOTE — Op Note (Signed)
NAMEMORGANNA, STYLES               ACCOUNT NO.:  [id number]  MEDICAL RECORD NO.:  [id number]  LOCATION:  5N20C                        FACILITY:  [facility]  PHYSICIAN:  Dionne Ano. Andrik Sandt, M.D.DATE OF BIRTH:  [birth year]  DATE OF PROCEDURE: DATE OF DISCHARGE:                              OPERATIVE REPORT   PREOPERATIVE DIAGNOSIS:  Complex right elbow fracture with comminuted radial head fragments, coronoid fracture, and pain with knee instability.  POSTOPERATIVE DIAGNOSIS:  Complex right elbow fracture with comminuted radial head fragments, coronoid fracture, and pain with knee instability.  PROCEDURE: 1. A radial head resection and radial head arthroplasty utilizing a     Biomet implant. 2. Lateral ulnar collateral ligament reconstruction repair with     juggernaut fixation, right elbow. 3. Synovectomy with multiple loose body removal and open treatment     coronoid fracture, right elbow. 4. Stress radiography, right elbow.  SURGEON:  Dionne Ano. Amanda Pea, MD  ASSISTANT:  Karie Chimera, PA-C  COMPLICATION:  None.  ANESTHESIA:  General with preoperative block.  TOURNIQUET TIME:  Less than an hour.  INDICATIONS:  A 22 year old female with a very comminuted radial head fracture with extrusion of fragments into the soft tissues and significant disarray of the joint.  I have counseled in regards to risks and benefits of surgery including risk of infection, bleeding, anesthesia, damage to normal structures, and failure of surgery to accomplish its intended goals of relieving symptoms and restoring function.  With this in mind, she desires proceed.  OPERATION IN DETAIL:  She was seen by myself and Dr. Burna Forts. The arm was marked.  Preop consent signed.  She was given a block by Dr. Jacklynn Bue.  Prepped and draped in usual sterile fashion in the operative arena with Betadine scrub and paint.  Sterile field was secured.  She was carefully padded.  Time-out called.  Pre and  postop check was complete.  The operation commenced with tourniquet insufflation and lateral incision.  A Kocher interval was used.  Interval between the ECU and anconeus was created.  I very carefully mapped this out.  She had extrusion of fragments into the soft tissues and this was portions of the radial head.  She had capsular disruption and at this juncture, I very carefully entered the joint, removed extruded fragments from the soft tissue.  She actually deep within her anconeus had buried portions of the radial head.  I removed this and then the additional radial head fragments in the joint.  This was very comminuted and complex.  I then placed a saw blade to make a smooth bevel cut about the radial neck region.  This was then filed.  Copious irrigation was applied.  Following this, I then performed open treatment of the coronoid fracture.  I irrigated this copiously.  I removed a loose cartilaginous fragment in the joint.  Thus open treatment of a coronoid fracture and removal of loose bodies and cartilaginous fragments were accomplished without difficulty.  The patient tolerated this well.  Once this was complete, the patient then had broaches placed in the canal.  She was sized up to a size 6.  A Biomet stem and then following this, she  had the smallest head size off the Biomet components used.  This was documented in her op note and chart of course.  The patient tolerated this quite well.  I was very careful to make sure I did not overstuff. I actually used a smallest head possible on the Biomet system and made sure she sat flush without an overstuffed position after I placed the trials.  I then placed it through a full range of motion and was pleased with the integrity of the joint.  She was not overstuffed, had no open book look on the ulnar humeral joint side and had full range of motion. I was pleased with this in the findings.  Following this I then irrigated copiously  followed by implantation of the final components.  Once this was done, I then performed very careful and cautious irrigation followed by juggernaut placement at the point of isometry and repair of lateral collateral ligament.  Thus radial head arthroplasty and lateral ulnar collateral ligament reconstruction were accomplished.  She did have scuffing off the capitellum and this will predispose her to earlier degenerative change in usual and general population.  Stress radiography was performed at multiple points and times during the procedure and this confirmed a smooth arc of motion and excellent reduction.  I was pleased with this in the findings.  Following this, we then irrigated and closed the anconeus ECU interval, and then additional irrigation followed by closure of the subcu with 3-0 Vicryl and skin with subcuticular Prolene.  Steri-Strips were applied.  She was placed in a sterile dressing and long-arm splint with derotational stirrups was applied.  She had smooth arc of motion, looked well and I copiously irrigated out multiple points at times during the procedure.  We will admit her for IV antibiotics, general postop observation.  I am going to continue Indocin over the next 2-3 weeks.  She is already on this.  We will monitor her condition closely.  She had a little bit of a fever today but felt well, had a normal white blood cell count and her lung fields had been cleared.  It was a pleasure to see her.  For therapeutic purposes postoperatively, we will see her back in 2 weeks, sutures out, x-rays taken.  We will then need to place her in a prefab splint and refer her to therapy.  Therapy will be likely through [facility].  At 3 weeks, we will begin well arm assisted range of motion with a Bledsoe brace.  Hopefully, we can use a Bledsoe brace and progressively allow her range of motion to the arm.  I do not want her past 45 degrees the first 4 weeks and between  weeks 4 and 6, we will let her go to 30 degrees extension.  She can go full flexion immediately.  Beyond 6 weeks we can stretch her and try to get the arm out to full extension over the course of the remaining therapeutic time.     Dionne Ano. Amanda Pea, M.D.     [facility]  D:  07/27/2012  T:  07/28/2012  Job:  [id number]

## 2012-07-28 NOTE — Evaluation (Signed)
Occupational Therapy Evaluation Patient Details Name: Theresa Finley MRN: [id number] DOB: 05-Nov-1990 Today's Date: 07/28/2012 Time: 1010-1030 OT Time Calculation (min): 20 min  OT Assessment / Plan / Recommendation Clinical Impression  Pt s/p ORIF righ radial head fx. Pt limited by pain but [place] to demonstrate elevation and edema control techniques as well as Right shoulder/digit AROM.  Feel that pt would benefit from R UE sling for comfort during ambulation.  No further acute OT needs.  Will need OPOT when cleared by MD at f/u visits,    OT Assessment  Patient does not need any further OT services    Follow Up Recommendations   (OPOT when cleared by MD)    Barriers to Discharge      Equipment Recommendations  Other (comment) (RUE sling for comfort)    Recommendations for Other Services    Frequency       Precautions / Restrictions     Pertinent Vitals/Pain See vitals    ADL  Eating/Feeding: Performed;Independent Where Assessed - Eating/Feeding: Bed level Grooming: Performed;Wash/dry face;Set up Where Assessed - Grooming: Supine, head of bed up Transfers/Ambulation Related to ADLs: Pt declining OOB mobility during eval but reports she has been ambulating to bathroom. ADL Comments: Pt reports she is limited during ambulation due to pressure in RUE when standing (possibly could benefit from sling?). Educated pt on perfroming Right shoulder/digit AROM throughout day to maintain ROM and assist in minimizing edema.  Educated pt on elevation on 3 pillows when in bed or chair as well as applying ice.  Pt Behanna to verbalize edema control techniques at end of session.  Pt's fiance present during session and is independent in assisting pt with ADLs as necessary.    OT Diagnosis:    OT Problem List:   OT Treatment Interventions:     OT Goals    Visit Information  Last OT Received On: 07/28/12 Assistance Needed: +1    Subjective Data  Subjective: "I know I need to try even though it  hurts"   Prior Functioning     Home Living Lives With: Other (Comment);Significant other (fiance) Available Help at Discharge: Family;Available PRN/intermittently Home Adaptive Equipment: None Prior Function Level of Independence: Independent [place] to Take Stairs?: Yes Communication Communication: No difficulties Dominant Hand: Right         Vision/Perception     Cognition  Overall Cognitive Status: Appears within functional limits for tasks assessed/performed Arousal/Alertness: Lethargic Orientation Level: Appears intact for tasks assessed Behavior During Session: Lethargic    Extremity/Trunk Assessment Right Upper Extremity Assessment RUE ROM/Strength/Tone: Deficits RUE ROM/Strength/Tone Deficits: Shoulder and digit AROM WFL.  Wrist and elbow immobilized in cast. RUE Sensation: WFL - Light Touch Left Upper Extremity Assessment LUE ROM/Strength/Tone: Within functional levels     Mobility       Shoulder Instructions     Exercise General Exercises - Upper Extremity Shoulder Flexion: AROM;Right;5 reps;Supine (HOB elevated) Shoulder Extension: AROM;Right;5 reps;Supine (HOB elevated) Digit Composite Flexion: AROM;Right;5 reps;Supine   Balance     End of Session OT - End of Session Activity Tolerance: Patient limited by pain;Patient limited by fatigue Patient left: in bed;with call bell/phone within reach;with family/visitor present Nurse Communication: Mobility status;Other (comment) (possible need for RUE sling during ambulation for comfort)  GO Functional Assessment Tool Used: clinical judgement Functional Limitation: Self care Self Care Current Status (W0981): At least 1 percent but less than 20 percent impaired, limited or restricted Self Care Goal Status (X9147): At least 1 percent but less  than 20 percent impaired, limited or restricted Self Care Discharge Status [phone]): At least 1 percent but less than 20 percent impaired, limited or restricted   07/28/2012 Cipriano Mile OTR/L Pager [phone] Office [phone]  Cipriano Mile 07/28/2012, 11:21 AM

## 2012-07-28 NOTE — Progress Notes (Signed)
Physical Therapy Evaluation Patient Details Name: Theresa Finley MRN: [id number] DOB: [birth year] Today's Date: 07/28/2012 Time: 7846-9629 PT Time Calculation (min): 4 min  PT Assessment / Plan / Recommendation Clinical Impression  22 yo s/p R elbow ORIF screened for skilled PT needs.  Is up in room independently/with fiance's supervision, denies any mobility limitations, acknowledges she needs to eat and sit up more.  Does not require the unique skills of PT to improve mobility.  Encouraged to be OOB more, elevate extremity, eat regularly, and discuss pain control measures with MD as needed.  Pt agrees to no PT needs, and d/c'd from this service.    PT Assessment  All further PT needs can be met in the next venue of care (OPPT once medically appropriate)    Follow Up Recommendations  Outpatient PT    Does the patient have the potential to tolerate intense rehabilitation      Barriers to Discharge        Equipment Recommendations       Recommendations for Other Services     Frequency      Precautions / Restrictions Restrictions Weight Bearing Restrictions: Yes RUE Weight Bearing: Non weight bearing   Pertinent Vitals/Pain At elbow, in mission sling       Mobility       Shoulder Instructions     Exercises     PT Diagnosis: Acute pain  PT Problem List:   PT Treatment Interventions:     PT Goals    Visit Information  Last PT Received On: 07/28/12 Assistance Needed: +1 Reason Eval/Treat Not Completed: Other (comment) (pt up without assist and no skilled needs)    Subjective Data  Subjective: I just laid back down.  I do ok.  I just need to eat   Prior Functioning  Home Living Lives With: Other (Comment);Significant other Available Help at Discharge: Family;Available PRN/intermittently    Cognition       Extremity/Trunk Assessment     Balance    End of Session    GP     Theresa Finley 07/28/2012, 2:42 PM

## 2012-07-28 NOTE — Progress Notes (Signed)
Pt has required IV dilaudid x 2 for relief of severe elbow pain. Is very anxious about going home with the amount of pain she is experiencing. Has had moderate relief with po pain medication, but pain becomes severe when she ambulates. Has been cooperative and has been using incentive spirometer as directed.

## 2012-07-28 NOTE — Progress Notes (Signed)
Orthopedic Tech Progress Note Patient Details:  Theresa Finley 1991/05/13 [id number] Arm sling applied to Right UE. Application tolerated well.   Ortho Devices Type of Ortho Device: Arm sling Ortho Device/Splint Location: Right Ortho Device/Splint Interventions: Application   Asia R Thompson 07/28/2012, 12:33 PM

## 2012-07-29 ENCOUNTER — Encounter (HOSPITAL_COMMUNITY): Payer: Self-pay | Admitting: Orthopedic Surgery

## 2012-07-29 MED ORDER — METHOCARBAMOL 500 MG PO TABS: 500.0000 mg | ORAL_TABLET | Freq: Four times a day (QID) | ORAL | Status: DC | PRN

## 2012-07-29 MED ORDER — HYDROMORPHONE HCL 2 MG PO TABS: 4.0000 mg | ORAL_TABLET | ORAL | Status: DC | PRN

## 2012-07-29 MED ORDER — ASCORBIC ACID 1000 MG PO TABS: 1000.0000 mg | ORAL_TABLET | Freq: Every day | ORAL | Status: DC

## 2012-07-29 MED ORDER — SENNA 8.6 MG PO TABS: 1.0000 | ORAL_TABLET | Freq: Two times a day (BID) | ORAL | Status: DC

## 2012-07-29 NOTE — Progress Notes (Signed)
Subjective: 2 Days Post-Op Procedure(s) (LRB): OPEN REDUCTION INTERNAL FIXATION (ORIF) RADIAL FRACTURE (Right) Patient reports pain as mild.    Objective: Vital signs in last 24 hours: Temp:  [98.3 F (36.8 C)-99.1 F (37.3 C)] 98.3 F (36.8 C) (01/05 2151) Pulse Rate:  [63-84] 63  (01/05 2151) Resp:  [16-18] 16  (01/05 2151) BP: (112-118)/(56-61) 118/61 mmHg (01/05 2151) SpO2:  [97 %-99 %] 97 % (01/05 2151)  Intake/Output from previous day: 01/05 0701 - 01/06 0700 In: 680 [P.O.:480; IV Piggyback:200] Out: -  Intake/Output this shift: Total I/O In: 200 [IV Piggyback:200] Out: -    Basename 07/27/12 0819  HGB 12.6    Basename 07/27/12 0819  WBC 7.7  RBC 4.17  HCT 36.4  PLT 196    Basename 07/27/12 0819  NA 134*  K 3.7  CL 101  CO2 21  BUN 8  CREATININE 0.78  GLUCOSE 95  CALCIUM 9.0   No results found for this basename: LABPT:2,INR:2 in the last 72 hours  Neurologically intact ABD soft Neurovascular intact Sensation intact distally Intact pulses distally Dorsiflexion/Plantar flexion intact No cellulitis present Compartment soft  Assessment/Plan: 2 Days Post-Op Procedure(s) (LRB): OPEN REDUCTION INTERNAL FIXATION (ORIF) RADIAL FRACTURE (Right) Up with therapy Plan for DC today. Patient looks quite well she has no complicating features. I discussed with the patient returned to my office in 12 days for followup and at that time we will make sure that we begin a therapy process for her. Discharge medicines are written in her chart and she understands the importance of taking his correctly. If she has an problems she will notify us. He is been a pleasure to see her. She is much improved. I will have her DC'd today after therapy.  Karen Chafe 07/29/2012, 6:13 AM

## 2012-07-29 NOTE — Discharge Summary (Signed)
Physician Discharge Summary  Patient ID: Taitum Menton MRN: [id number] DOB/AGE: 09-13-1990 21 y.o.  Admit date: 07/27/2012 Discharge date: 07/29/2012  Admission Diagnoses: Fracture dislocation right elbow  Discharge Diagnoses: Fracture dislocation right elbow status post surgical reconstruction 07/27/2012 with radial head arthroplasty and ligamentous reconstruction Active Problems:  * No active hospital problems. *    Discharged Condition: good  Hospital Course: Patient was admitted postoperatively for antibiotics pain control and observation after elbow reconstruction. She did fairly well. Her pain was ultimately controlled with dye log did well. She understands the importance of elevation range of motion and other measures and has demonstrated good range of motion and tolerances of the last 12-24 hours. She is ambulatory tolerating a diet and has no complicating features. She is ready for discharge. She understands to followup in 12-14 days in my office will begin the therapy process.  Consults: None  Significant Diagnostic Studies: See chart  Treatments: surgery: See op note  Discharge Exam: Blood pressure 118/61, pulse 63, temperature 98.3 F (36.8 C), temperature source Oral, resp. rate 16, height 5\' 4"  (1.626 m), weight 59.875 kg (132 lb), last menstrual period 07/17/2012, SpO2 97.00%. General appearance: alert, cooperative and appears stated age..The patient is alert and oriented in no acute distress the patient complains of pain in the affected upper extremity. The patient is noted to have a normal HEENT exam. Lung fields show equal chest expansion and no shortness of breath abdomen exam is nontender without distention. Lower extremity examination does not show any fracture dislocation or blood clot symptoms. Pelvis is stable neck and back are stable and nontender  Disposition: 01-Home or Self Care     Medication List     As of 07/29/2012  6:15 AM    ASK your doctor about these  medications         acetaminophen 500 MG tablet   Commonly known as: TYLENOL   Take 1,000 mg by mouth every 6 (six) hours as needed.      indomethacin 75 MG CR capsule   Commonly known as: INDOCIN SR   Take 1 capsule (75 mg total) by mouth daily with breakfast.      oxyCODONE-acetaminophen 5-325 MG per tablet   Commonly known as: PERCOCET/ROXICET   Take 1 tablet by mouth every 4 (four) hours as needed for pain.         SignedKaren Chafe 07/29/2012, 6:15 AM

## 2012-07-29 NOTE — Progress Notes (Signed)
Completed discharge instructions with patient. Prescriptions given. Instructed about follow up appointment, how often to take pain medication, how to elevate the extremity, and to keep the bandage dry. DC instructions given. All questions answered. Patient ambulated with significant other to private vehicle.

## 2013-05-18 ENCOUNTER — Encounter (HOSPITAL_COMMUNITY): Payer: Self-pay | Admitting: Emergency Medicine

## 2013-05-18 ENCOUNTER — Ambulatory Visit (HOSPITAL_COMMUNITY): Payer: Medicaid Other | Attending: Family Medicine

## 2013-05-18 ENCOUNTER — Emergency Department (INDEPENDENT_AMBULATORY_CARE_PROVIDER_SITE_OTHER)
Admission: EM | Admit: 2013-05-18 | Discharge: 2013-05-18 | Disposition: A | Discharge status: Home / Self Care | Payer: Medicaid Other | Source: Home / Self Care | Attending: Family Medicine | Admitting: Family Medicine

## 2013-05-18 DIAGNOSIS — M25521 Pain in right elbow: Secondary | ICD-10-CM

## 2013-05-18 DIAGNOSIS — M25529 Pain in unspecified elbow: Secondary | ICD-10-CM

## 2013-05-18 DIAGNOSIS — S61012A Laceration without foreign body of left thumb without damage to nail, initial encounter: Secondary | ICD-10-CM

## 2013-05-18 MED ORDER — HYDROMORPHONE HCL PF 1 MG/ML IJ SOLN
INTRAMUSCULAR | Status: AC
  Filled 2013-05-18: qty 2

## 2013-05-18 MED ORDER — HYDROMORPHONE HCL 1 MG/ML IJ SOLN
2.0000 mg | Freq: Once | INTRAMUSCULAR | Status: AC
  Administered 2013-05-18: 2 mg via INTRAMUSCULAR

## 2013-05-18 MED ORDER — MELOXICAM 15 MG PO TABS: 15.0000 mg | ORAL_TABLET | Freq: Every day | ORAL | Status: DC

## 2013-05-18 MED ORDER — OXYCODONE-ACETAMINOPHEN 10-325 MG PO TABS
1.0000 | ORAL_TABLET | ORAL | Status: DC | PRN
Start: 2013-05-18 — End: 2013-09-28

## 2013-05-18 MED ORDER — HYDROCODONE-ACETAMINOPHEN 5-325 MG PO TABS
2.0000 | ORAL_TABLET | Freq: Once | ORAL | Status: AC
  Administered 2013-05-18: 2 via ORAL

## 2013-05-18 MED ORDER — HYDROCODONE-ACETAMINOPHEN 5-325 MG PO TABS
ORAL_TABLET | ORAL | Status: AC
  Filled 2013-05-18: qty 2

## 2013-05-18 NOTE — ED Notes (Signed)
Medium  r  Arm  Sling  Applied

## 2013-05-18 NOTE — ED Notes (Signed)
Pt  Has  Two  Complaints   She  Has  Pain  r  Elbow   For  sev  Days    denys  specefic  Traumatic  Injury  But  She  Reports  Certain  Movements  Cause  The  Pain     Old  Surgery  9  Months  Ago      That  Elbow  -     She  Also  Reports        She  Sustained  A  lacertaion  To   l  Thumb  2  Days  Ago   Bleeding  susided

## 2013-05-18 NOTE — ED Provider Notes (Signed)
CSN: [id number]     Arrival date & time 05/18/13  1248 History   First MD Initiated Contact with Patient 05/18/13 1512     Chief Complaint  Patient presents with  . Laceration   (Consider location/radiation/quality/duration/timing/severity/associated sxs/prior Treatment) Patient is a 22 y.o. female presenting with skin laceration.  Laceration  Laceration left thumb, happened 2 days ago at work, using an electric know, was painful initially and bleeding but has now stopped, throbbing pain in distal finger without drainage or redness; pt used "liquid band aid: to seal it  Right Elbow Pain - acute on chronic after patient had a communinuted and displaced radial head fracture requiring prosthesis in January 2014, so patient notes pain has been present since then but worsening over the past few weeks and it is very severe and preventing her from sleeping; she describes the pain as a "tooth ache;" patient states that she followed up with her orthopedic surgeon, Dr. 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 No pertinent past medical history. Past Surgical History  Procedure Laterality Date  . Orif radial fracture  07/27/2012    Procedure: OPEN REDUCTION INTERNAL FIXATION (ORIF) RADIAL FRACTURE;  Surgeon: Dominica Severin, MD;  Location: [facility] OR;  Service: Orthopedics;  Laterality: Right;  ORIF Right Radial Head Fracture   No family history on file. History  Substance Use Topics  . Smoking status: Current Some Day Smoker  . Smokeless tobacco: Not on file     Comment: Patient reports that she rarly smokes  . Alcohol Use: No   OB History   Grav Para Term Preterm Abortions TAB SAB Ect Mult Living                  Review of Systems  Allergies  Review of patient's allergies indicates no known allergies.  Home Medications   Current Outpatient Rx  Name  Route  Sig  Dispense  Refill  . acetaminophen (TYLENOL) 500 MG tablet   Oral   Take 1,000 mg by mouth every 6 (six) hours as needed.         Marland Kitchen HYDROmorphone (DILAUDID) 2 MG tablet   Oral   Take 2 tablets (4 mg total) by mouth every 4 (four) hours as needed.   55 tablet   0   . indomethacin (INDOCIN SR) 75 MG CR capsule   Oral   Take 1 capsule (75 mg total) by mouth daily with breakfast.   15 capsule   0   . meloxicam (MOBIC) 15 MG tablet   Oral   Take 1 tablet (15 mg total) by mouth daily.   30 tablet   0   . methocarbamol (ROBAXIN) 500 MG tablet   Oral   Take 1 tablet (500 mg total) by mouth every 6 (six) hours as needed.   50 tablet   0   . oxyCODONE-acetaminophen (PERCOCET) 10-325 MG per tablet   Oral   Take 1 tablet by mouth every 4 (four) hours as needed for pain.   30 tablet   0   . senna (SENOKOT) 8.6 MG TABS   Oral   Take 1 tablet (8.6 mg total) by mouth 2 (two) times daily.  120 each   1   . vitamin C (VITAMIN C) 1000 MG tablet   Oral   Take 1 tablet (1,000 mg total) by mouth daily.   120 tablet   1    BP 114/71  Pulse 100  Temp(Src) 97.7 F (36.5 C) (Oral)  Resp 20  SpO2 100%  LMP 05/18/2013 Physical Exam Gen: thin young female, uncomfortable appearing  Left thumb: closed healing laceration at base of left thumb nail without active bleeding, swelling, erythema or drainage - no concern for active infection; normal flexion and extension of MCP and IP joint Right Elbow: passive extension limited to 150 degrees and and passive flexion limited to 90 degrees secondary to pain, pain with resisted pronation and supination but cannot appreciate any subluxation of radial head; mild edema, generalized tenderness of medial and lateral epicondyles    ED Course  Procedures (including critical care  time) Labs Review Labs Reviewed - No data to display Imaging Review Dg Elbow Complete Right  05/18/2013   CLINICAL DATA:  Pain  EXAM: RIGHT ELBOW - COMPLETE 3+ VIEW  COMPARISON:  07/23/2012  FINDINGS: Four views of the right elbow submitted. No acute fracture or subluxation. No posterior fat pad sign. There is a right radial head prosthesis. Significant periprosthetic lucency is noted. FINDINGS highly suspicious for loosening of prosthesis. Clinical correlation is necessary.  IMPRESSION: No acute fracture or subluxation. No posterior fat pad sign. There is a right radial head prosthesis. Significant periprosthetic lucency is noted. FINDINGS highly suspicious for loosening of prosthesis. Clinical correlation is necessary.   Electronically Signed   By: Natasha Mead M.D.   On: 05/18/2013 16:21      MDM   1. Right elbow pain   2. Laceration of left thumb, initial encounter    Right Elbow Pain - Pt with concern for loosening of prosthesis vs soft tissue injury from heavy lifting, patient treated with hydrocodone 5-325 x 2 in office without relief so she was given dilaudid 2 mg IM for pain; discharged with rx for mobic 15 mg PO daily # 30 and percocet 10-325 po Q8 hrs PRN # 30 with 0 refills and instructed to follow up with Dr. Amanda Pea  Laceration - soaked in iodine and well healing, no need for close or antibiotics; dressed with vaseline, gauze and coban     Garnetta Buddy, MD 05/18/13 2025

## 2013-05-20 NOTE — ED Provider Notes (Signed)
Medical screening examination/treatment/procedure(s) were performed by a resident physician or non-physician practitioner and as the supervising physician I was immediately available for consultation/collaboration.  Luva Metzger, MD    Richrd Kuzniar S Montez Cuda, MD 05/20/13 0756 

## 2013-07-24 NOTE — L&D Delivery Note (Signed)
Delivery Note At 6:12 AM a viable and healthy female was delivered via Vaginal, Spontaneous Delivery (Presentation: LOA ).  APGAR: 9, 9; weight pending.   Placenta status: Intact, Spontaneous.  Cord: 3 vessels with the following complications: Short.    Anesthesia: Epidural  Episiotomy: None Lacerations: 1st degree;Labial Suture Repair: 3.0 vicryl rapide Est. Blood Loss (mL): 400  Mom to postpartum.  Baby to Couplet care / Skin to Skin.  G2P1001 @[redacted]w[redacted]d  pt of [facility] presented to MAU in active labor with SROM in MAU prior to admission.  She received epidural, then rapidly progressed to 10 cm and pushed ~10 minutes to deliver without complications.  Infant cried immediately and was placed on mother's abdomen. Umbilical cord short, making placement of infant at mother's chest difficult until clamped and cut.  Cord clamping delayed by several minutes then clamped by CNM and cut by FOB.  Placenta spontaneous and intact with some trailing membranes removed easily with ring forceps.  Some increased bleeding with clots following delivery so cytotec 800 mcg placed rectally.  Bleeding slowed, and mom and baby stable.  LEFTWICH-KIRBY, Theresa Finley 05/25/2014, 6:42 AM

## 2013-09-28 ENCOUNTER — Emergency Department (HOSPITAL_COMMUNITY)
Admission: EM | Admit: 2013-09-28 | Discharge: 2013-09-28 | Disposition: A | Discharge status: Home / Self Care | Payer: Medicaid Other | Attending: Emergency Medicine | Admitting: Emergency Medicine

## 2013-09-28 ENCOUNTER — Encounter (HOSPITAL_COMMUNITY): Payer: Self-pay | Admitting: Emergency Medicine

## 2013-09-28 DIAGNOSIS — J029 Acute pharyngitis, unspecified: Secondary | ICD-10-CM | POA: Insufficient documentation

## 2013-09-28 DIAGNOSIS — G8928 Other chronic postprocedural pain: Secondary | ICD-10-CM | POA: Insufficient documentation

## 2013-09-28 DIAGNOSIS — F17200 Nicotine dependence, unspecified, uncomplicated: Secondary | ICD-10-CM | POA: Insufficient documentation

## 2013-09-28 DIAGNOSIS — M25539 Pain in unspecified wrist: Secondary | ICD-10-CM | POA: Insufficient documentation

## 2013-09-28 DIAGNOSIS — M25521 Pain in right elbow: Secondary | ICD-10-CM

## 2013-09-28 LAB — RAPID STREP SCREEN (MED CTR MEBANE ONLY): Streptococcus, Group A Screen (Direct): NEGATIVE

## 2013-09-28 MED ORDER — HYDROCODONE-ACETAMINOPHEN 5-325 MG PO TABS
1.0000 | ORAL_TABLET | Freq: Once | ORAL | Status: AC
  Administered 2013-09-28: 1 via ORAL
  Filled 2013-09-28: qty 1

## 2013-09-28 MED ORDER — TRAMADOL HCL 50 MG PO TABS: 50.0000 mg | ORAL_TABLET | Freq: Four times a day (QID) | ORAL | Status: DC | PRN

## 2013-09-28 NOTE — ED Notes (Signed)
Pt c/o sore throat x 2 weeks with associated ear pain. C/o pain to L elbow at site of previous surgery.

## 2013-09-28 NOTE — ED Provider Notes (Signed)
CSN: [id number]     Arrival date & time 09/28/13  0416 History   First MD Initiated Contact with Patient 09/28/13 [phone]     Chief Complaint  Patient presents with  . Sore Throat  . Elbow Pain     (Consider location/radiation/quality/duration/timing/severity/associated sxs/prior Treatment) Patient is a 23 y.o. female presenting with pharyngitis. The history is provided by the patient.  Sore Throat  pt c/o sore throat in past week. Pain is constant, dull, moderate, non radiating. Is [place] to swallow without difficulty. No trouble breathing. No fb sensation. No definite known mono or strep exposure. No unilateral throat pain/swelling, and no acute or abrupt change in symptoms this morning. No cough, nasal congestion, fevers, or other uri c/o. 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 Pain constant. Dull. Moderate. Denies acute or abrupt change in pain. No recent fall or injury. No swelling or redness.     History reviewed. No pertinent past medical history. Past Surgical History  Procedure Laterality Date  . Orif radial fracture  07/27/2012    Procedure: OPEN REDUCTION INTERNAL FIXATION (ORIF) RADIAL FRACTURE;  Surgeon: Dominica SeverinWilliam Gramig, MD;  Location: [facility] OR;  Service: Orthopedics;  Laterality: Right;  ORIF Right Radial Head Fracture   No family history on file. History  Substance Use Topics  . Smoking status: Current Some Day Smoker  . Smokeless tobacco: Not on file     Comment: Patient reports that she rarly smokes  . Alcohol Use: No   OB History   Grav Para Term Preterm Abortions TAB SAB Ect Mult Living                 Review of Systems  Constitutional: Negative for fever and chills.  HENT: Positive for sore throat. Negative for congestion.   Eyes: Negative for redness.  Respiratory: Negative for cough and choking.   Gastrointestinal: Negative for vomiting and diarrhea.  Musculoskeletal: Negative for myalgias.  Skin: Negative for rash.   Neurological: Negative for numbness.      Allergies  Review of patient's allergies indicates no known allergies.  Home Medications   Current Outpatient Rx  Name  Route  Sig  Dispense  Refill  . acetaminophen (TYLENOL) 500 MG tablet   Oral   Take 1,000 mg by mouth every 6 (six) hours as needed.         Marland Kitchen. ibuprofen (ADVIL,MOTRIN) 200 MG tablet   Oral   Take 200 mg by mouth every 6 (six) hours as needed.         . Throat Lozenges (COUGH DROPS MENTHOL MT)   Mouth/Throat   Use as directed 1 lozenge in the mouth or throat 3 (three) times daily as needed.           BP 105/65  Pulse 71  Temp(Src) 98.2 F (36.8 C) (Oral)  Resp 16  Ht 5\' 4"  (1.626 m)  Wt 115 lb (52.164 kg)  BMI 19.73 kg/m2  SpO2 100%  LMP 08/29/2013 Physical Exam  Nursing note and vitals reviewed. Constitutional: She appears well-developed and well-nourished. No distress.  HENT:  Nose: Nose normal.  Mouth/Throat: Oropharynx is clear and moist. No oropharyngeal exudate.  Pharynx erythematous, no asymmetric swelling or abscess.   Eyes: Conjunctivae are normal. No scleral icterus.  Neck: Neck supple. No tracheal deviation present. No thyromegaly present.  No stiffness or rigidity. No swelling or neck tenderness.   Cardiovascular: Normal rate.   Pulmonary/Chest: Effort normal and breath sounds normal. No respiratory  distress.  No stridor. No increased wob.   Abdominal: Normal appearance. She exhibits no distension.  Musculoskeletal: She exhibits no edema.  Lymphadenopathy:    She has no cervical adenopathy.  Neurological: She is alert.  Skin: Skin is warm and dry. No rash noted. She is not diaphoretic.  Psychiatric: She has a normal mood and affect.    ED Course  Procedures (including critical care time)     MDM  Pt has ride, does not have to drive. vicodin 1 po.  No recent injury. No elbow swelling or erythema.  Good passive rom without pain. Discussed follow up with her orthopedist.    Strep screen sent.  Reviewed nursing notes and prior charts for additional history.   rx ultram for home, limited quantity, until can follow up with her doctor/orthopedist.   At d/c pt requests refill 'of her percocet 10s' - it was discussed w pt that if she needed refill of 'percocet' or other/additional narcotic pain medication for chronic pain issue, that she would need to contact her provider.   Pt appears very comfortable, and stable for d/c.   Suzi Roots, MD 09/29/13 [phone]

## 2013-09-28 NOTE — Discharge Instructions (Signed)
Take motrin or aleve as need for pain. You may also take ultram as need for pain - no driving for the next 4 hours or when taking ultram. For throat, you may try throat lozenges as need for symptom relief. Follow up with primary care doctor in 1 week for throat pain if symptoms fail to improve/resolve. For elbow pain, follow up with your orthopedist in the next 1-2 weeks. Return to ER if worse, unable to swallow, trouble breathing, fevers, other concern.    Sore Throat A sore throat is pain, burning, irritation, or scratchiness of the throat. There is often pain or tenderness when swallowing or talking. A sore throat may be accompanied by other symptoms, such as coughing, sneezing, fever, and swollen neck glands. A sore throat is often the first sign of another sickness, such as a cold, flu, strep throat, or mononucleosis (commonly known as mono). Most sore throats go away without medical treatment. CAUSES  The most common causes of a sore throat include:  A viral infection, such as a cold, flu, or mono.  A bacterial infection, such as strep throat, tonsillitis, or whooping cough.  Seasonal allergies.  Dryness in the air.  Irritants, such as smoke or pollution.  Gastroesophageal reflux disease (GERD). HOME CARE INSTRUCTIONS   Only take over-the-counter medicines as directed by your caregiver.  Drink enough fluids to keep your urine clear or pale yellow.  Rest as needed.  Try using throat sprays, lozenges, or sucking on hard candy to ease any pain (if older than 4 years or as directed).  Sip warm liquids, such as broth, herbal tea, or warm water with honey to relieve pain temporarily. You may also eat or drink cold or frozen liquids such as frozen ice pops.  Gargle with salt water (mix 1 tsp salt with 8 oz of water).  Do not smoke and avoid secondhand smoke.  Put a cool-mist humidifier in your bedroom at night to moisten the air. You can also turn on a hot shower and sit in the  bathroom with the door closed for 5 10 minutes. SEEK IMMEDIATE MEDICAL CARE IF:  You have difficulty breathing.  You are unable to swallow fluids, soft foods, or your saliva.  You have increased swelling in the throat.  Your sore throat does not get better in 7 days.  You have nausea and vomiting.  You have a fever or persistent symptoms for more than 2 3 days.  You have a fever and your symptoms suddenly get worse. MAKE SURE YOU:   Understand these instructions.  Will watch your condition.  Will get help right away if you are not doing well or get worse. Document Released: 08/17/2004 Document Revised: 06/26/2012 Document Reviewed: 03/17/2012 [facility] Patient Information 2014 [place], [place].

## 2013-09-30 LAB — CULTURE, GROUP A STREP

## 2014-01-04 ENCOUNTER — Emergency Department (HOSPITAL_COMMUNITY)
Admission: EM | Admit: 2014-01-04 | Discharge: 2014-01-05 | Disposition: A | Discharge status: Home / Self Care | Payer: Medicaid Other | Attending: Emergency Medicine | Admitting: Emergency Medicine

## 2014-01-04 ENCOUNTER — Encounter (HOSPITAL_COMMUNITY): Payer: Self-pay | Admitting: Emergency Medicine

## 2014-01-04 DIAGNOSIS — N72 Inflammatory disease of cervix uteri: Secondary | ICD-10-CM

## 2014-01-04 DIAGNOSIS — K0889 Other specified disorders of teeth and supporting structures: Secondary | ICD-10-CM

## 2014-01-04 DIAGNOSIS — O2390 Unspecified genitourinary tract infection in pregnancy, unspecified trimester: Secondary | ICD-10-CM | POA: Insufficient documentation

## 2014-01-04 DIAGNOSIS — O9989 Other specified diseases and conditions complicating pregnancy, childbirth and the puerperium: Secondary | ICD-10-CM | POA: Insufficient documentation

## 2014-01-04 DIAGNOSIS — M25519 Pain in unspecified shoulder: Secondary | ICD-10-CM | POA: Insufficient documentation

## 2014-01-04 DIAGNOSIS — R51 Headache: Secondary | ICD-10-CM | POA: Insufficient documentation

## 2014-01-04 DIAGNOSIS — O99330 Smoking (tobacco) complicating pregnancy, unspecified trimester: Secondary | ICD-10-CM | POA: Insufficient documentation

## 2014-01-04 DIAGNOSIS — K089 Disorder of teeth and supporting structures, unspecified: Secondary | ICD-10-CM | POA: Insufficient documentation

## 2014-01-04 LAB — CBC WITH DIFFERENTIAL/PLATELET
Basophils Absolute: 0 10*3/uL (ref 0.0–0.1)
Basophils Relative: 0 % (ref 0–1)
EOS ABS: 0.2 10*3/uL (ref 0.0–0.7)
Eosinophils Relative: 2 % (ref 0–5)
HCT: 35.7 % — ABNORMAL LOW (ref 36.0–46.0)
Hemoglobin: 11.9 g/dL — ABNORMAL LOW (ref 12.0–15.0)
LYMPHS ABS: 1.9 10*3/uL (ref 0.7–4.0)
LYMPHS PCT: 20 % (ref 12–46)
MCH: 30.5 pg (ref 26.0–34.0)
MCHC: 33.3 g/dL (ref 30.0–36.0)
MCV: 91.5 fL (ref 78.0–100.0)
Monocytes Absolute: 0.5 10*3/uL (ref 0.1–1.0)
Monocytes Relative: 5 % (ref 3–12)
NEUTROS PCT: 73 % (ref 43–77)
Neutro Abs: 6.8 10*3/uL (ref 1.7–7.7)
PLATELETS: 221 10*3/uL (ref 150–400)
RBC: 3.9 MIL/uL (ref 3.87–5.11)
RDW: 13.8 % (ref 11.5–15.5)
WBC: 9.4 10*3/uL (ref 4.0–10.5)

## 2014-01-04 LAB — COMPREHENSIVE METABOLIC PANEL
ALT: 12 U/L (ref 0–35)
AST: 20 U/L (ref 0–37)
Albumin: 3.1 g/dL — ABNORMAL LOW (ref 3.5–5.2)
Alkaline Phosphatase: 66 U/L (ref 39–117)
BUN: 9 mg/dL (ref 6–23)
CALCIUM: 8.9 mg/dL (ref 8.4–10.5)
CO2: 21 meq/L (ref 19–32)
Chloride: 101 mEq/L (ref 96–112)
Creatinine, Ser: 0.57 mg/dL (ref 0.50–1.10)
GFR calc Af Amer: >90 mL/min (ref >90)
Glucose, Bld: 74 mg/dL (ref 70–99)
Potassium: 4 mEq/L (ref 3.7–5.3)
SODIUM: 134 meq/L — AB (ref 137–147)
Total Bilirubin: 0.4 mg/dL (ref 0.3–1.2)
Total Protein: 7.1 g/dL (ref 6.0–8.3)

## 2014-01-04 LAB — WET PREP, GENITAL
TRICH WET PREP: NONE SEEN
Yeast Wet Prep HPF POC: NONE SEEN

## 2014-01-04 LAB — URINALYSIS, ROUTINE W REFLEX MICROSCOPIC
Bilirubin Urine: NEGATIVE
GLUCOSE, UA: NEGATIVE mg/dL
Hgb urine dipstick: NEGATIVE
Ketones, ur: 40 mg/dL — AB
Nitrite: NEGATIVE
PH: 7 (ref 5.0–8.0)
Protein, ur: NEGATIVE mg/dL
Specific Gravity, Urine: 1.023 (ref 1.005–1.030)
Urobilinogen, UA: 0.2 mg/dL (ref 0.0–1.0)

## 2014-01-04 LAB — URINE MICROSCOPIC-ADD ON

## 2014-01-04 LAB — ACETAMINOPHEN LEVEL: Acetaminophen (Tylenol), Serum: <15 ug/mL (ref 10–30)

## 2014-01-04 MED ORDER — OXYCODONE HCL 5 MG PO TABS
5.0000 mg | ORAL_TABLET | Freq: Once | ORAL | Status: AC
  Administered 2014-01-04: 5 mg via ORAL
  Filled 2014-01-04: qty 1

## 2014-01-04 NOTE — ED Notes (Addendum)
Pt arrived to the ED with a complaint of vaginal spotting.  Pt is 5 months pregnant.  Pt states spotting began 4 hours ago.  Pt states her wisdom teeth are hurting.  Pt states she is also having pain in her left shoulder. Pt extremely lethargic in triage and had to be coaxed excessively to answer questions.

## 2014-01-04 NOTE — ED Provider Notes (Signed)
CSN: [id number]     Arrival date & time 01/04/14  2120 History   First MD Initiated Contact with Patient 01/04/14 2144     Chief Complaint  Patient presents with  . Vaginal Bleeding     (Consider location/radiation/quality/duration/timing/severity/associated sxs/prior Treatment) HPI Theresa Finley is a 23 y.o. female, G2P1,who presents to emergency department with multiple complaints. Patient states she's having severe left-sided toothache, left shoulder pain, and having vaginal spotting. Patient reports she's 5 months pregnant. She has been to health department for this pregnancy and had a urine pregnancy test done. Based on her last menstrual cycle is estimated that she is now at 17wks. She has not had an ultrasound done for this pregnancy. Her appointment for first OB evaluation is tomorrow. Weeks, states it's getting worse. She's unable to see a dentist at this time because she does not have Medicaid and does not have a way pay for the visit. Patient also reports persistent left shoulder pain, states she believes it's radiating from her toothache. Patient states the vaginal spotting began today. She denies any abdominal pain but states she feels like "baby is sitting very low and like she is going to push it out" She denies any vaginal discharge or fluid leaking. She denies any fever, chills. She's taking Tylenol for her pain and states she took only 12 extra strength Tylenols in the last 12 hours, she states it is not helping her pain.  History reviewed. No pertinent past medical history. Past Surgical History  Procedure Laterality Date  . Orif radial fracture  07/27/2012    Procedure: OPEN REDUCTION INTERNAL FIXATION (ORIF) RADIAL FRACTURE;  Surgeon: Dominica SeverinWilliam Gramig, MD;  Location: [facility] OR;  Service: Orthopedics;  Laterality: Right;  ORIF Right Radial Head Fracture   History reviewed. No pertinent family history. History  Substance Use Topics  . Smoking status: Current Some Day Smoker  .  Smokeless tobacco: Not on file     Comment: Patient reports that she rarly smokes  . Alcohol Use: No   OB History   Grav Para Term Preterm Abortions TAB SAB Ect Mult Living   1              Review of Systems  Constitutional: Negative for fever and chills.  HENT: Positive for dental problem.   Respiratory: Negative for cough, chest tightness and shortness of breath.   Cardiovascular: Negative for chest pain, palpitations and leg swelling.  Gastrointestinal: Negative for nausea, vomiting, abdominal pain and diarrhea.  Genitourinary: Positive for vaginal bleeding. Negative for dysuria, flank pain, vaginal discharge, vaginal pain and pelvic pain.  Musculoskeletal: Positive for arthralgias and myalgias. Negative for neck pain and neck stiffness.  Skin: Negative for rash.  Neurological: Positive for headaches. Negative for dizziness and weakness.  All other systems reviewed and are negative.     Allergies  Review of patient's allergies indicates no known allergies.  Home Medications   Prior to Admission medications   Medication Sig Start Date End Date Taking? Authorizing Provider  acetaminophen (TYLENOL) 500 MG tablet Take 1,000 mg by mouth every 6 (six) hours as needed (for pain.).    Yes Historical Provider, MD  prenatal vitamin w/FE, FA (NATACHEW) 29-1 MG CHEW chewable tablet Chew 2 tablets by mouth daily at 12 noon.   Yes Historical Provider, MD   BP 110/58  Pulse 98  Temp(Src) 98.8 F (37.1 C) (Oral)  Resp 18  SpO2 98%  LMP 08/29/2013 Physical Exam  Nursing note and vitals reviewed. Constitutional:  She appears well-developed and well-nourished. No distress.  HENT:  Head: Normocephalic.  No significant abnormalities. No swelling of the gums. No obvious abscess. Tender to palpation over left lower 2nd molar. No facial swelling. No trismus.   Eyes: Conjunctivae are normal.  Neck: Neck supple.  Cardiovascular: Normal rate, regular rhythm and normal heart sounds.    Pulmonary/Chest: Effort normal and breath sounds normal. No respiratory distress. She has no wheezes. She has no rales.  Abdominal: Soft. Bowel sounds are normal. She exhibits no distension. There is no tenderness. There is no rebound.  gravid  Genitourinary:  Normal external genitalia. White thick vaginal discharge. Normal appearing cervix. It is closed. No CMT. No uterine or adnexal tenderness  Musculoskeletal: She exhibits no edema.  Neurological: She is alert.  Skin: Skin is warm and dry.  Psychiatric: She has a normal mood and affect. Her behavior is normal.    ED Course  Procedures (including critical care time) Labs Review Labs Reviewed  WET PREP, GENITAL - Abnormal; Notable for the following:    Clue Cells Wet Prep HPF POC FEW (*)    WBC, Wet Prep HPF POC TOO NUMEROUS TO COUNT (*)    All other components within normal limits  CBC WITH DIFFERENTIAL - Abnormal; Notable for the following:    Hemoglobin 11.9 (*)    HCT 35.7 (*)    All other components within normal limits  URINALYSIS, ROUTINE W REFLEX MICROSCOPIC - Abnormal; Notable for the following:    APPearance CLOUDY (*)    Ketones, ur 40 (*)    Leukocytes, UA TRACE (*)    All other components within normal limits  COMPREHENSIVE METABOLIC PANEL - Abnormal; Notable for the following:    Sodium 134 (*)    Albumin 3.1 (*)    All other components within normal limits  URINE MICROSCOPIC-ADD ON - Abnormal; Notable for the following:    Squamous Epithelial / LPF MANY (*)    All other components within normal limits  GC/CHLAMYDIA PROBE AMP  URINE CULTURE  ACETAMINOPHEN LEVEL  ABO/RH    Imaging Review No results found.   EKG Interpretation None      MDM   Final diagnoses:  Pain, dental  Cervicitis  Shoulder pain    OB nurse here, pt is only 17 wks. No need to have baby monitored at this time. Pelvic exam unremarkable, cervix is closed. No bleeding. Will get labs, check tylenol level, oxycodone ordered for  dental pain. No obvious abscess.   12:58 AM pts wet prep shows TNTC WBCs. No abdominal pain. No CMT or uterine tenderness, do not think PID. She is afebrile. WIll treat with rocephin and zithromax in ED. Pt received oxycodone for dental pain here. Home with percocet and amoxil for dental pain. She has an apt for OB follow up tomorrow.  Tylenol levels negative. Stable for d/c home at this time. Will give referral for a dentist.   Filed Vitals:   01/04/14 2140  BP: 110/58  Pulse: 98  Temp: 98.8 F (37.1 C)  TempSrc: Oral  Resp: 18  SpO2: 98%     Irais Mottram A Jemery Stacey, PA-C 01/05/14 0102  1:46 AM Pt refusing to leave until she gets more pain medicine for her toothache and left shoulder pain. Explained to her that I have already gave her an oxycodone and a prescription that she can fill. Pt became arrogant, and told me that we "are not doing our job." I have advised her to go see a  dentist for her dental pain tomorrow, and fill prescription for pain medicine. Pt stated "i dont have medicare to fill my prescription tonight." Advised that she can fill her prescription at [facility] for less than 10 dollars. Pt discharged home.     Lottie Mussel, PA-C 01/05/14 0148

## 2014-01-04 NOTE — Progress Notes (Signed)
Pt in for some vaginal spotting today and for severe pain in left lower wisdom tooth radiating to left shoulder and down left side.  Pt reports having intercourse 3 days ago, no abdominal pain at this time, reports feeling "pressure" earlier, but none at this time.  ED provider reports no vaginal/cervical bleeding seen on speculum exam; vaginal cultures obtained along with u/a and blood work.  Pt reports that she received a confirmation of pregnancy from the health department which is where her edc came from/based on her lmp.  She has an OB appt at the health department 6/15.  [facility] spoke with ED provider Dr Wilkie AyeHorton, discussed plan of care.  Dr Wilkie AyeHorton does not feel that the [facility] providers need to be involved due to pt's complaints and gestation.

## 2014-01-05 LAB — ABO/RH: ABO/RH(D): A POS

## 2014-01-05 LAB — GC/CHLAMYDIA PROBE AMP
CT PROBE, AMP APTIMA: NEGATIVE
GC PROBE AMP APTIMA: NEGATIVE

## 2014-01-05 MED ORDER — AZITHROMYCIN 250 MG PO TABS
1000.0000 mg | ORAL_TABLET | Freq: Once | ORAL | Status: AC
  Administered 2014-01-05: 1000 mg via ORAL
  Filled 2014-01-05: qty 4

## 2014-01-05 MED ORDER — AMOXICILLIN 500 MG PO CAPS: 500.0000 mg | ORAL_CAPSULE | Freq: Three times a day (TID) | ORAL | Status: DC

## 2014-01-05 MED ORDER — LIDOCAINE HCL 1 % IJ SOLN
INTRAMUSCULAR | Status: AC
  Administered 2014-01-05: 2.1 mL
  Filled 2014-01-05: qty 20

## 2014-01-05 MED ORDER — CEFTRIAXONE SODIUM 250 MG IJ SOLR
250.0000 mg | Freq: Once | INTRAMUSCULAR | Status: AC
  Administered 2014-01-05: 250 mg via INTRAMUSCULAR
  Filled 2014-01-05: qty 250

## 2014-01-05 MED ORDER — OXYCODONE-ACETAMINOPHEN 5-325 MG PO TABS: 1.0000 | ORAL_TABLET | ORAL | Status: DC | PRN

## 2014-01-05 NOTE — ED Provider Notes (Signed)
Medical screening examination/treatment/procedure(s) were performed by non-physician practitioner and as supervising physician I was immediately available for consultation/collaboration.   EKG Interpretation None        Courtney F Horton, MD 01/05/14 1441 

## 2014-01-05 NOTE — Discharge Instructions (Signed)
Please keep your appointment and follow tomorrow. You can try to follow up with a dentist as referred, they will work with you as far as payment. Take amoxicillin as prescribed. Percocet for pain.   Dental Pain A tooth ache may be caused by cavities (tooth decay). Cavities expose the nerve of the tooth to air and hot or cold temperatures. It may come from an infection or abscess (also called a boil or furuncle) around your tooth. It is also often caused by dental caries (tooth decay). This causes the pain you are having. DIAGNOSIS  Your caregiver can diagnose this problem by exam. TREATMENT   If caused by an infection, it may be treated with medications which kill germs (antibiotics) and pain medications as prescribed by your caregiver. Take medications as directed.  Only take over-the-counter or prescription medicines for pain, discomfort, or fever as directed by your caregiver.  Whether the tooth ache today is caused by infection or dental disease, you should see your dentist as soon as possible for further care. SEEK MEDICAL CARE IF: The exam and treatment you received today has been provided on an emergency basis only. This is not a substitute for complete medical or dental care. If your problem worsens or new problems (symptoms) appear, and you are unable to meet with your dentist, call or return to this location. SEEK IMMEDIATE MEDICAL CARE IF:   You have a fever.  You develop redness and swelling of your face, jaw, or neck.  You are unable to open your mouth.  You have severe pain uncontrolled by pain medicine. MAKE SURE YOU:   Understand these instructions.  Will watch your condition.  Will get help right away if you are not doing well or get worse. Document Released: 07/10/2005 Document Revised: 10/02/2011 Document Reviewed: 02/26/2008 [facility] Patient Information 2014 [place] JunctionExitCare, [place]LL[place].

## 2014-01-06 LAB — URINE CULTURE
Colony Count: NO GROWTH
Culture: NO GROWTH

## 2014-02-12 ENCOUNTER — Other Ambulatory Visit (HOSPITAL_COMMUNITY): Payer: Self-pay | Admitting: Nurse Practitioner

## 2014-02-12 DIAGNOSIS — Z3689 Encounter for other specified antenatal screening: Secondary | ICD-10-CM

## 2014-02-12 LAB — OB RESULTS CONSOLE HIV ANTIBODY (ROUTINE TESTING): HIV: NONREACTIVE

## 2014-02-12 LAB — OB RESULTS CONSOLE RPR: RPR: NONREACTIVE

## 2014-02-12 LAB — OB RESULTS CONSOLE HEPATITIS B SURFACE ANTIGEN: Hepatitis B Surface Ag: NEGATIVE

## 2014-02-12 LAB — OB RESULTS CONSOLE ANTIBODY SCREEN: Antibody Screen: NEGATIVE

## 2014-02-12 LAB — OB RESULTS CONSOLE GC/CHLAMYDIA
CHLAMYDIA, DNA PROBE: NEGATIVE
GC PROBE AMP, GENITAL: NEGATIVE

## 2014-02-12 LAB — OB RESULTS CONSOLE RUBELLA ANTIBODY, IGM: Rubella: IMMUNE

## 2014-02-16 ENCOUNTER — Ambulatory Visit (HOSPITAL_COMMUNITY)
Admission: RE | Admit: 2014-02-16 | Discharge: 2014-02-16 | Disposition: A | Discharge status: Home / Self Care | Payer: Medicaid Other | Source: Ambulatory Visit | Attending: Nurse Practitioner | Admitting: Nurse Practitioner

## 2014-02-16 DIAGNOSIS — Z3689 Encounter for other specified antenatal screening: Secondary | ICD-10-CM | POA: Insufficient documentation

## 2014-02-24 ENCOUNTER — Other Ambulatory Visit (HOSPITAL_COMMUNITY): Payer: Self-pay | Admitting: Nurse Practitioner

## 2014-02-24 DIAGNOSIS — Z1389 Encounter for screening for other disorder: Secondary | ICD-10-CM

## 2014-03-16 ENCOUNTER — Ambulatory Visit (HOSPITAL_COMMUNITY): Payer: Medicaid Other | Attending: Nurse Practitioner

## 2014-03-31 ENCOUNTER — Ambulatory Visit (HOSPITAL_COMMUNITY): Admission: RE | Admit: 2014-03-31 | Payer: Medicaid Other | Source: Ambulatory Visit

## 2014-05-17 ENCOUNTER — Inpatient Hospital Stay (HOSPITAL_COMMUNITY)
Admission: AD | Admit: 2014-05-17 | Discharge: 2014-05-17 | Disposition: A | Discharge status: Home / Self Care | Payer: Medicaid Other | Source: Ambulatory Visit | Attending: Obstetrics & Gynecology | Admitting: Obstetrics & Gynecology

## 2014-05-17 ENCOUNTER — Encounter (HOSPITAL_COMMUNITY): Payer: Self-pay | Admitting: *Deleted

## 2014-05-17 DIAGNOSIS — Z87891 Personal history of nicotine dependence: Secondary | ICD-10-CM | POA: Insufficient documentation

## 2014-05-17 DIAGNOSIS — B9689 Other specified bacterial agents as the cause of diseases classified elsewhere: Secondary | ICD-10-CM | POA: Insufficient documentation

## 2014-05-17 DIAGNOSIS — Z3A36 36 weeks gestation of pregnancy: Secondary | ICD-10-CM

## 2014-05-17 DIAGNOSIS — N760 Acute vaginitis: Secondary | ICD-10-CM | POA: Insufficient documentation

## 2014-05-17 DIAGNOSIS — Z3A49 Greater than 42 weeks gestation of pregnancy: Secondary | ICD-10-CM | POA: Insufficient documentation

## 2014-05-17 DIAGNOSIS — O9989 Other specified diseases and conditions complicating pregnancy, childbirth and the puerperium: Secondary | ICD-10-CM | POA: Diagnosis present

## 2014-05-17 DIAGNOSIS — O23593 Infection of other part of genital tract in pregnancy, third trimester: Secondary | ICD-10-CM | POA: Insufficient documentation

## 2014-05-17 HISTORY — DX: Other specified health status: Z78.9

## 2014-05-17 LAB — WET PREP, GENITAL
TRICH WET PREP: NONE SEEN
YEAST WET PREP: NONE SEEN

## 2014-05-17 LAB — POCT FERN TEST: POCT Fern Test: NEGATIVE

## 2014-05-17 LAB — AMNISURE RUPTURE OF MEMBRANE (ROM) NOT AT ARMC: Amnisure ROM: NEGATIVE

## 2014-05-17 MED ORDER — METRONIDAZOLE 500 MG PO TABS: 500.0000 mg | ORAL_TABLET | Freq: Two times a day (BID) | ORAL | Status: DC

## 2014-05-17 NOTE — MAU Provider Note (Signed)
History     CSN: [id number]  Arrival date and time: 05/17/14 0351   None     Chief Complaint  Patient presents with  . Rupture of Membranes   HPI  Theresa Finley is a 23 y.o. G2P1001 at 9055w5d who presents with 4 days of leaking fluids. Patient denies any large gushes of fluid, but does report that she has woken up with soaked underwear twice. Fluid is described as clear and non-malodorous.   Denies any vaginal bleeding. Endorses intermittent, irregular contractions that are not very strong or frequent. Endorses good fetal movement. No abdominal pain.  No fevers or chills.   OB History   Grav Para Term Preterm Abortions TAB SAB Ect Mult Living   2 1 1  0 0 0 0 0 0 1      Past Medical History  Diagnosis Date  . Medical history non-contributory     Past Surgical History  Procedure Laterality Date  . Orif radial fracture  07/27/2012    Procedure: OPEN REDUCTION INTERNAL FIXATION (ORIF) RADIAL FRACTURE;  Surgeon: Dominica SeverinWilliam Gramig, MD;  Location: [facility] OR;  Service: Orthopedics;  Laterality: Right;  ORIF Right Radial Head Fracture    Family History  Problem Relation Age of Onset  . Cancer Mother     History  Substance Use Topics  . Smoking status: Former Games developermoker  . Smokeless tobacco: Not on file     Comment: Patient reports that she rarly smokes  . Alcohol Use: No    Allergies: No Known Allergies  Prescriptions prior to admission  Medication Sig Dispense Refill  . acetaminophen (TYLENOL) 500 MG tablet Take 1,000 mg by mouth every 6 (six) hours as needed (for pain.).       Marland Kitchen. prenatal vitamin w/FE, FA (NATACHEW) 29-1 MG CHEW chewable tablet Chew 2 tablets by mouth daily at 12 noon.      Marland Kitchen. amoxicillin (AMOXIL) 500 MG capsule Take 1 capsule (500 mg total) by mouth 3 (three) times daily.  21 capsule  0  . oxyCODONE-acetaminophen (PERCOCET) 5-325 MG per tablet Take 1 tablet by mouth every 4 (four) hours as needed for severe pain.  20 tablet  0    Review of Systems  All  other systems reviewed and are negative.  Physical Exam   Blood pressure 119/57, pulse 73, temperature 98.5 F (36.9 C), resp. rate 20, height 5\' 4"  (1.626 m), weight 61.054 kg (134 lb 9.6 oz), last menstrual period 09/02/2013.  Physical Exam  Nursing note and vitals reviewed. Constitutional: She is oriented to person, place, and time. She appears well-developed and well-nourished. No distress.  HENT:  Head: Normocephalic and atraumatic.  Eyes: EOM are normal.  Neck: Normal range of motion.  Cardiovascular: Normal rate, regular rhythm and normal heart sounds.   No murmur heard. Respiratory: Effort normal and breath sounds normal. No respiratory distress. She has no wheezes.  GI: Soft. There is no tenderness.  Uterine size appropriate for gestational age   Musculoskeletal: Normal range of motion. She exhibits no edema.  Neurological: She is alert and oriented to person, place, and time. She has normal reflexes.  Skin: Skin is warm and dry.   FHT:  FHR: 130 bpm, variability: moderate,  accelerations:  present,  decelerations:  None  Dilation: 4 Effacement (%): 50 Cervical Position: Posterior Station: -2 Presentation: Vertex Exam by:: ginger morris rn    Results for orders placed during the hospital encounter of 05/17/14 (from the past 24 hour(s))  WET PREP, GENITAL  Status: Abnormal   Collection Time    05/17/14  5:30 AM      Result Value Ref Range   Yeast Wet Prep HPF POC NONE SEEN  NONE SEEN   Trich, Wet Prep NONE SEEN  NONE SEEN   Clue Cells Wet Prep HPF POC FEW (*) NONE SEEN   WBC, Wet Prep HPF POC MODERATE (*) NONE SEEN  POCT FERN TEST     Status: None   Collection Time    05/17/14  5:42 AM      Result Value Ref Range   POCT Fern Test Negative = intact amniotic membranes    AMNISURE RUPTURE OF MEMBRANE (ROM)     Status: None   Collection Time    05/17/14  6:35 AM      Result Value Ref Range   Amnisure ROM NEGATIVE      Procedures-none   Assessment and  Plan  A:1573w5d IUP. FHT category 1. Cervical exam 4/50/-2 without change while in the MAU. Fern and amnisure negative. Wet prep consistent with BV.  P: Discharge home in stable condition. Will give course of metronidazole for BV Labor precautions and kick counts reviewed.   Jacquiline Doearker, Caleb 05/17/2014, 6:55 AM   I spoke with and examined patient and agree with resident/PA/SNM's note and plan of care.  Cheral MarkerKimberly R. Ethyn Schetter, CNM, [facility] IncWHNP-BC 05/17/2014 8:29 AM

## 2014-05-17 NOTE — MAU Note (Signed)
OK to d/c efm per Dr Jimmey RalphParker

## 2014-05-17 NOTE — Progress Notes (Signed)
Dr. Parker in to see pt. 

## 2014-05-17 NOTE — Progress Notes (Signed)
Dr Jimmey RalphParker notified of pt's admission and status. Will see pt

## 2014-05-17 NOTE — Progress Notes (Signed)
Dr Jimmey RalphParker notified of negative amnisure. RN to ck pt's cervix in 20-30 mins

## 2014-05-17 NOTE — Discharge Instructions (Signed)

## 2014-05-17 NOTE — Progress Notes (Signed)
Dr Jimmey RalphParker in to do spec exam to r/o SROM. Pt tol well. Fern slide obtained

## 2014-05-17 NOTE — MAU Note (Signed)
Leaking watery fld for 3-4 days. No pain. No bleeding

## 2014-05-25 ENCOUNTER — Encounter (HOSPITAL_COMMUNITY): Payer: Self-pay | Admitting: *Deleted

## 2014-05-25 ENCOUNTER — Inpatient Hospital Stay (HOSPITAL_COMMUNITY): Payer: Medicaid Other | Admitting: Anesthesiology

## 2014-05-25 ENCOUNTER — Inpatient Hospital Stay (HOSPITAL_COMMUNITY)
Admission: AD | Admit: 2014-05-25 | Discharge: 2014-05-27 | DRG: 775 | Disposition: A | Discharge status: Home / Self Care | Payer: Medicaid Other | Source: Ambulatory Visit | Attending: Family Medicine | Admitting: Family Medicine

## 2014-05-25 DIAGNOSIS — IMO0001 Reserved for inherently not codable concepts without codable children: Secondary | ICD-10-CM

## 2014-05-25 DIAGNOSIS — Z3483 Encounter for supervision of other normal pregnancy, third trimester: Secondary | ICD-10-CM | POA: Diagnosis present

## 2014-05-25 DIAGNOSIS — Z3A37 37 weeks gestation of pregnancy: Secondary | ICD-10-CM | POA: Diagnosis present

## 2014-05-25 LAB — RAPID URINE DRUG SCREEN, HOSP PERFORMED
AMPHETAMINES: NOT DETECTED
BENZODIAZEPINES: NOT DETECTED
Barbiturates: NOT DETECTED
Cocaine: NOT DETECTED
OPIATES: NOT DETECTED
Tetrahydrocannabinol: POSITIVE — AB

## 2014-05-25 LAB — CBC
HEMATOCRIT: 34.5 % — AB (ref 36.0–46.0)
Hemoglobin: 12.1 g/dL (ref 12.0–15.0)
MCH: 31.9 pg (ref 26.0–34.0)
MCHC: 35.1 g/dL (ref 30.0–36.0)
MCV: 91 fL (ref 78.0–100.0)
Platelets: 118 10*3/uL — ABNORMAL LOW (ref 150–400)
RBC: 3.79 MIL/uL — ABNORMAL LOW (ref 3.87–5.11)
RDW: 13.8 % (ref 11.5–15.5)
WBC: 13.5 10*3/uL — ABNORMAL HIGH (ref 4.0–10.5)

## 2014-05-25 LAB — RAPID HIV SCREEN (WH-MAU): Rapid HIV Screen: NONREACTIVE

## 2014-05-25 LAB — TYPE AND SCREEN
ABO/RH(D): A POS
Antibody Screen: NEGATIVE

## 2014-05-25 LAB — ABO/RH: ABO/RH(D): A POS

## 2014-05-25 LAB — GROUP B STREP BY PCR: GROUP B STREP BY PCR: NEGATIVE

## 2014-05-25 LAB — RPR

## 2014-05-25 LAB — OB RESULTS CONSOLE GBS: GBS: NEGATIVE

## 2014-05-25 LAB — HIV ANTIBODY (ROUTINE TESTING W REFLEX): HIV: NONREACTIVE

## 2014-05-25 MED ORDER — PHENYLEPHRINE 40 MCG/ML (10ML) SYRINGE FOR IV PUSH (FOR BLOOD PRESSURE SUPPORT)
80.0000 ug | PREFILLED_SYRINGE | INTRAVENOUS | Status: DC | PRN
  Filled 2014-05-25: qty 2
  Filled 2014-05-25: qty 10

## 2014-05-25 MED ORDER — CITRIC ACID-SODIUM CITRATE 334-500 MG/5ML PO SOLN: 30.0000 mL | ORAL | Status: DC | PRN

## 2014-05-25 MED ORDER — ONDANSETRON HCL 4 MG/2ML IJ SOLN: 4.0000 mg | INTRAMUSCULAR | Status: DC | PRN

## 2014-05-25 MED ORDER — FENTANYL CITRATE 0.05 MG/ML IJ SOLN: 100.0000 ug | INTRAMUSCULAR | Status: DC | PRN

## 2014-05-25 MED ORDER — FENTANYL 2.5 MCG/ML BUPIVACAINE 1/10 % EPIDURAL INFUSION (WH - ANES)
INTRAMUSCULAR | Status: DC | PRN
Start: 2014-05-25 — End: 2014-05-25
  Administered 2014-05-25: 14 mL/h via EPIDURAL

## 2014-05-25 MED ORDER — DIBUCAINE 1 % RE OINT: 1.0000 "application " | TOPICAL_OINTMENT | RECTAL | Status: DC | PRN

## 2014-05-25 MED ORDER — OXYCODONE-ACETAMINOPHEN 5-325 MG PO TABS
1.0000 | ORAL_TABLET | ORAL | Status: DC | PRN
  Administered 2014-05-25 – 2014-05-26 (×4): 1 via ORAL
  Filled 2014-05-25 (×4): qty 1

## 2014-05-25 MED ORDER — LACTATED RINGERS IV SOLN
500.0000 mL | Freq: Once | INTRAVENOUS | Status: AC
  Administered 2014-05-25: 500 mL via INTRAVENOUS

## 2014-05-25 MED ORDER — OXYCODONE-ACETAMINOPHEN 5-325 MG PO TABS
1.0000 | ORAL_TABLET | ORAL | Status: DC | PRN
  Administered 2014-05-25: 1 via ORAL
  Filled 2014-05-25: qty 1

## 2014-05-25 MED ORDER — BENZOCAINE-MENTHOL 20-0.5 % EX AERO: 1.0000 "application " | INHALATION_SPRAY | CUTANEOUS | Status: DC | PRN

## 2014-05-25 MED ORDER — SIMETHICONE 80 MG PO CHEW: 80.0000 mg | CHEWABLE_TABLET | ORAL | Status: DC | PRN

## 2014-05-25 MED ORDER — ZOLPIDEM TARTRATE 5 MG PO TABS: 5.0000 mg | ORAL_TABLET | Freq: Every evening | ORAL | Status: DC | PRN

## 2014-05-25 MED ORDER — INFLUENZA VAC SPLIT QUAD 0.5 ML IM SUSY
0.5000 mL | PREFILLED_SYRINGE | INTRAMUSCULAR | Status: AC
  Administered 2014-05-26: 0.5 mL via INTRAMUSCULAR
  Filled 2014-05-25: qty 0.5

## 2014-05-25 MED ORDER — FENTANYL CITRATE 0.05 MG/ML IJ SOLN
100.0000 ug | INTRAMUSCULAR | Status: AC
  Administered 2014-05-25: 100 ug via INTRAVENOUS
  Filled 2014-05-25: qty 2

## 2014-05-25 MED ORDER — LANOLIN HYDROUS EX OINT: TOPICAL_OINTMENT | CUTANEOUS | Status: DC | PRN

## 2014-05-25 MED ORDER — ACETAMINOPHEN 325 MG PO TABS: 650.0000 mg | ORAL_TABLET | ORAL | Status: DC | PRN

## 2014-05-25 MED ORDER — OXYCODONE-ACETAMINOPHEN 5-325 MG PO TABS
2.0000 | ORAL_TABLET | ORAL | Status: DC | PRN
Start: 2014-05-25 — End: 2014-05-27
  Administered 2014-05-25 – 2014-05-27 (×6): 2 via ORAL
  Filled 2014-05-25 (×6): qty 2

## 2014-05-25 MED ORDER — OXYCODONE-ACETAMINOPHEN 5-325 MG PO TABS: 2.0000 | ORAL_TABLET | ORAL | Status: DC | PRN

## 2014-05-25 MED ORDER — LACTATED RINGERS IV SOLN: INTRAVENOUS | Status: DC

## 2014-05-25 MED ORDER — LIDOCAINE HCL (PF) 1 % IJ SOLN
INTRAMUSCULAR | Status: DC | PRN
  Administered 2014-05-25 (×2): 8 mL

## 2014-05-25 MED ORDER — MISOPROSTOL 200 MCG PO TABS
800.0000 ug | ORAL_TABLET | Freq: Once | ORAL | Status: AC
  Administered 2014-05-25: 800 ug via RECTAL

## 2014-05-25 MED ORDER — OXYTOCIN 40 UNITS IN LACTATED RINGERS INFUSION - SIMPLE MED
62.5000 mL/h | INTRAVENOUS | Status: DC
  Administered 2014-05-25: 62.5 mL/h via INTRAVENOUS
  Filled 2014-05-25: qty 1000

## 2014-05-25 MED ORDER — MISOPROSTOL 200 MCG PO TABS
ORAL_TABLET | ORAL | Status: AC
  Filled 2014-05-25: qty 4

## 2014-05-25 MED ORDER — IBUPROFEN 600 MG PO TABS
600.0000 mg | ORAL_TABLET | Freq: Four times a day (QID) | ORAL | Status: DC
  Administered 2014-05-25 – 2014-05-27 (×10): 600 mg via ORAL
  Filled 2014-05-25 (×10): qty 1

## 2014-05-25 MED ORDER — ONDANSETRON HCL 4 MG PO TABS: 4.0000 mg | ORAL_TABLET | ORAL | Status: DC | PRN

## 2014-05-25 MED ORDER — EPHEDRINE 5 MG/ML INJ
10.0000 mg | INTRAVENOUS | Status: DC | PRN
  Filled 2014-05-25: qty 2

## 2014-05-25 MED ORDER — LIDOCAINE HCL (PF) 1 % IJ SOLN
30.0000 mL | INTRAMUSCULAR | Status: DC | PRN
  Filled 2014-05-25: qty 30

## 2014-05-25 MED ORDER — SENNOSIDES-DOCUSATE SODIUM 8.6-50 MG PO TABS
2.0000 | ORAL_TABLET | ORAL | Status: DC
  Administered 2014-05-25 – 2014-05-26 (×2): 2 via ORAL
  Filled 2014-05-25: qty 2
  Filled 2014-05-25: qty 1
  Filled 2014-05-25: qty 2

## 2014-05-25 MED ORDER — PRENATAL MULTIVITAMIN CH
1.0000 | ORAL_TABLET | Freq: Every day | ORAL | Status: DC
  Administered 2014-05-25 – 2014-05-27 (×3): 1 via ORAL
  Filled 2014-05-25 (×3): qty 1

## 2014-05-25 MED ORDER — PHENYLEPHRINE 40 MCG/ML (10ML) SYRINGE FOR IV PUSH (FOR BLOOD PRESSURE SUPPORT)
80.0000 ug | PREFILLED_SYRINGE | INTRAVENOUS | Status: DC | PRN
  Filled 2014-05-25: qty 2

## 2014-05-25 MED ORDER — DIPHENHYDRAMINE HCL 25 MG PO CAPS: 25.0000 mg | ORAL_CAPSULE | Freq: Four times a day (QID) | ORAL | Status: DC | PRN

## 2014-05-25 MED ORDER — LACTATED RINGERS IV SOLN: 500.0000 mL | INTRAVENOUS | Status: DC | PRN

## 2014-05-25 MED ORDER — ONDANSETRON HCL 4 MG/2ML IJ SOLN: 4.0000 mg | Freq: Four times a day (QID) | INTRAMUSCULAR | Status: DC | PRN

## 2014-05-25 MED ORDER — TETANUS-DIPHTH-ACELL PERTUSSIS 5-2.5-18.5 LF-MCG/0.5 IM SUSP
0.5000 mL | Freq: Once | INTRAMUSCULAR | Status: AC
  Administered 2014-05-26: 0.5 mL via INTRAMUSCULAR
  Filled 2014-05-25: qty 0.5

## 2014-05-25 MED ORDER — DIPHENHYDRAMINE HCL 50 MG/ML IJ SOLN: 12.5000 mg | INTRAMUSCULAR | Status: DC | PRN

## 2014-05-25 MED ORDER — OXYTOCIN BOLUS FROM INFUSION: 500.0000 mL | INTRAVENOUS | Status: DC

## 2014-05-25 MED ORDER — FENTANYL 2.5 MCG/ML BUPIVACAINE 1/10 % EPIDURAL INFUSION (WH - ANES)
14.0000 mL/h | INTRAMUSCULAR | Status: DC | PRN
  Filled 2014-05-25: qty 125

## 2014-05-25 MED ORDER — LACTATED RINGERS IV BOLUS (SEPSIS)
1000.0000 mL | Freq: Once | INTRAVENOUS | Status: AC
Start: 2014-05-25 — End: 2014-05-25
  Administered 2014-05-25: 1000 mL via INTRAVENOUS

## 2014-05-25 MED ORDER — WITCH HAZEL-GLYCERIN EX PADS: 1.0000 "application " | MEDICATED_PAD | CUTANEOUS | Status: DC | PRN

## 2014-05-25 NOTE — Anesthesia Preprocedure Evaluation (Signed)
Anesthesia Evaluation  Patient identified by MRN, date of birth, ID band Patient awake    Reviewed: Allergy & Precautions, H&P , NPO status , Patient's Chart, lab work & pertinent test results  Airway Mallampati: I  TM Distance: >3 FB Neck ROM: full    Dental no notable dental hx.    Pulmonary neg pulmonary ROS, former smoker,  breath sounds clear to auscultation  Pulmonary exam normal       Cardiovascular negative cardio ROS      Neuro/Psych negative neurological ROS  negative psych ROS   GI/Hepatic negative GI ROS, Neg liver ROS,   Endo/Other  negative endocrine ROS  Renal/GU negative Renal ROS     Musculoskeletal   Abdominal Normal abdominal exam  (+)   Peds  Hematology negative hematology ROS (+)   Anesthesia Other Findings   Reproductive/Obstetrics (+) Pregnancy                             Anesthesia Physical Anesthesia Plan  ASA: II  Anesthesia Plan: Epidural   Post-op Pain Management:    Induction:   Airway Management Planned:   Additional Equipment:   Intra-op Plan:   Post-operative Plan:   Informed Consent: I have reviewed the patients History and Physical, chart, labs and discussed the procedure including the risks, benefits and alternatives for the proposed anesthesia with the patient or authorized representative who has indicated his/her understanding and acceptance.     Plan Discussed with:   Anesthesia Plan Comments:         Anesthesia Quick Evaluation

## 2014-05-25 NOTE — Progress Notes (Deleted)
Theresa Finley is a 23 y.o. G2P1001 at 7258w6d admitted for induction of labor due to postdates.  Subjective: Family in room for support.  Pt comfortable with epidural.  Objective: BP 124/74 mmHg  Pulse 69  Resp 18  Ht 5\' 4"  (1.626 m)  Wt 61.236 kg (135 lb)  BMI 23.16 kg/m2  LMP 09/02/2013      FHT:  FHR: 140 bpm, variability: moderate,  accelerations:  Present,  decelerations:  Absent UC:   regular, every 3-4 minutes.  MVUs 150s.  SVE:   Dilation: 4.5 Effacement (%): 60 Station: -2 Exam by: Sharen CounterLisa Leftwich-Kirby, CNM  Labs: Lab Results  Component Value Date   WBC 9.4 01/04/2014   HGB 11.9* 01/04/2014   HCT 35.7* 01/04/2014   MCV 91.5 01/04/2014   PLT 221 01/04/2014    Assessment / Plan: Induction of labor due to postdates,  progressing well on pitocin  Labor: Contractions inadequate but increasing in intensity.  Continue to increase Pitocin until adequate. Preeclampsia:  n/a Fetal Wellbeing:  Category I Pain Control:  Epidural I/D:  n/a Anticipated MOD:  NSVD  LEFTWICH-KIRBY, Theresa Finley 05/25/2014, 4:20 AM

## 2014-05-25 NOTE — Plan of Care (Signed)
Problem: Phase I Progression Outcomes Goal: Pain controlled with appropriate interventions Outcome: Completed/Met Date Met:  05/25/14 Goal: Voiding adequately Outcome: Completed/Met Date Met:  05/25/14 Goal: OOB as tolerated unless otherwise ordered Outcome: Completed/Met Date Met:  05/25/14 Goal: VS, stable, temp < 100.4 degrees F Outcome: Completed/Met Date Met:  05/25/14 Goal: Initial discharge plan identified Outcome: Completed/Met Date Met:  05/25/14  Problem: Phase II Progression Outcomes Goal: Tolerating diet Outcome: Completed/Met Date Met:  05/25/14

## 2014-05-25 NOTE — Anesthesia Procedure Notes (Signed)
Epidural Patient location during procedure: OB Start time: 05/25/2014 4:47 AM End time: 05/25/2014 4:51 AM  Staffing Anesthesiologist: Leilani AbleHATCHETT, Nitesh Pitstick  Preanesthetic Checklist Completed: patient identified, surgical consent, pre-op evaluation, timeout performed, IV checked, risks and benefits discussed and monitors and equipment checked  Epidural Patient position: sitting Prep: site prepped and draped and DuraPrep Patient monitoring: continuous pulse ox and blood pressure Approach: midline Location: L3-L4 Injection technique: LOR air  Needle:  Needle type: Tuohy  Needle gauge: 17 G Needle length: 9 cm and 9 Needle insertion depth: 5 cm cm Catheter type: closed end flexible Catheter size: 19 Gauge Catheter at skin depth: 10 cm Test dose: negative and Other  Assessment Sensory level: T9 Events: blood not aspirated, injection not painful, no injection resistance, negative IV test and no paresthesia

## 2014-05-25 NOTE — Plan of Care (Signed)
Problem: Phase I Progression Outcomes Goal: Other Phase I Outcomes/Goals Outcome: Completed/Met Date Met:  05/25/14  Problem: Phase II Progression Outcomes Goal: Pain controlled on oral analgesia Outcome: Completed/Met Date Met:  05/25/14 Goal: Progress activity as tolerated unless otherwise ordered Outcome: Completed/Met Date Met:  05/25/14 Goal: Afebrile, VS remain stable Outcome: Completed/Met Date Met:  05/25/14 Goal: Other Phase II Outcomes/Goals Outcome: Completed/Met Date Met:  05/25/14

## 2014-05-25 NOTE — Anesthesia Postprocedure Evaluation (Signed)
  Anesthesia Post-op Note  Patient: Theresa Finley  Procedure(s) Performed: * No procedures listed *  Patient Location: Mother/Baby  Anesthesia Type:Epidural  Level of Consciousness: awake, alert , oriented and patient cooperative  Airway and Oxygen Therapy: Patient Spontanous Breathing  Post-op Pain: mild  Post-op Assessment: Patient's Cardiovascular Status Stable, Respiratory Function Stable, No headache, No backache, No residual numbness and No residual motor weakness  Post-op Vital Signs: stable  Last Vitals:  Filed Vitals:   05/25/14 0900  BP: 125/70  Pulse: 60  Temp: 36.6 C  Resp: 16    Complications: No apparent anesthesia complications

## 2014-05-25 NOTE — MAU Note (Signed)
Pt c/o ctx q3 minutes for 30 minutes. Denies LOF and VB, +FM

## 2014-05-25 NOTE — H&P (Signed)
Theresa Finley is a 23 y.o. female G2P1001 @[redacted]w[redacted]d  pt of [facility] presenting for labor evaluation.  She reports contractions x1 hour prior to arrival in MAU.  While in MAU, she had a large gush of fluid and ferning was positive by RN.  She denies complications with her health or any pregnancy complications.  Her GBS was performed in the office on Friday and results are not available.  She denies GBS sepsis of her previous child.  She reports good fetal movement, denies vaginal bleeding, vaginal itching/burning, urinary symptoms, h/a, dizziness, n/v, or fever/chills.    [facility]  Maternal Medical History:  Reason for admission: Rupture of membranes and contractions.  Nausea.  Contractions: Onset was 1-2 hours ago.   Frequency: regular.   Duration is approximately 1 minute.   Perceived severity is strong.    Fetal activity: Perceived fetal activity is normal.   Last perceived fetal movement was within the past hour.    Prenatal complications: no prenatal complications Prenatal Complications - Diabetes: none.    OB History    Gravida Para Term Preterm AB TAB SAB Ectopic Multiple Living   2 1 1  0 0 0 0 0 0 1     Past Medical History  Diagnosis Date  . Medical history non-contributory    Past Surgical History  Procedure Laterality Date  . Orif radial fracture  07/27/2012    Procedure: OPEN REDUCTION INTERNAL FIXATION (ORIF) RADIAL FRACTURE;  Surgeon: Dominica SeverinWilliam Gramig, MD;  Location: [facility] OR;  Service: Orthopedics;  Laterality: Right;  ORIF Right Radial Head Fracture  . No past surgeries     Family History: family history includes Cancer in her mother. Social History:  reports that she has quit smoking. She does not have any smokeless tobacco history on file. She reports that she does not drink alcohol or use illicit drugs.   Prenatal Transfer Tool  Maternal Diabetes: No Genetic Screening: Declined Maternal Ultrasounds/Referrals: Normal Fetal Ultrasounds or other  Referrals:  None Maternal Substance Abuse:  No Significant Maternal Medications:  None Significant Maternal Lab Results:  Lab values include: Other:  Other Comments:  GBS Unknown, GBS PCR pending  Review of Systems  Constitutional: Negative for fever, chills and malaise/fatigue.  Eyes: Negative for blurred vision.  Respiratory: Negative for cough and shortness of breath.   Cardiovascular: Negative for chest pain.  Gastrointestinal: Positive for abdominal pain. Negative for heartburn, nausea and vomiting.  Genitourinary: Negative for dysuria, urgency and frequency.  Musculoskeletal: Negative.   Neurological: Negative for dizziness and headaches.  Psychiatric/Behavioral: Negative for depression.    Dilation: 4.5 Effacement (%): 90 Station: -2 Exam by:: K Henson, RNC Blood pressure 124/74, pulse 69, resp. rate 18, height 5\' 4"  (1.626 m), weight 61.236 kg (135 lb), last menstrual period 09/02/2013. Maternal Exam:  Uterine Assessment: Contraction strength is moderate.  Contraction duration is 70 seconds. Contraction frequency is regular.      Fetal Exam Fetal Monitor Review: Mode: ultrasound.   Baseline rate: 135.  Variability: moderate (6-25 bpm).   Pattern: accelerations present and no decelerations.    Fetal State Assessment: Category I - tracings are normal.     Physical Exam  Nursing note and vitals reviewed. Constitutional: She is oriented to person, place, and time. She appears well-developed and well-nourished.  Neck: Normal range of motion.  Cardiovascular: Normal rate and regular rhythm.   Respiratory: Effort normal and breath sounds normal.  GI: Soft.  Musculoskeletal: Normal range of motion.  Neurological:  She is alert and oriented to person, place, and time. She has normal reflexes.  Skin: Skin is warm and dry.  Psychiatric: She has a normal mood and affect. Her behavior is normal. Judgment and thought content normal.    Prenatal labs: ABO, Rh: --/--/A POS  (06/14 2219) Antibody:  negative Rubella:  immune RPR:   nonreactive HBsAg:   negative HIV:   nonreactive GBS:   unknown, collected 10/30 with results pending.  PCR collected on admission.  Assessment/Plan: G2P1001 @[redacted]w[redacted]d  pt of [facility] admitted for SROM and labor at term GBS unknown  [facility] May have epidural when desired GBS PCR collected Anticipate NSVD  LEFTWICH-KIRBY, LISA 05/25/2014, 3:49 AM

## 2014-05-25 NOTE — Progress Notes (Signed)
Ur chart review completed.  

## 2014-05-26 NOTE — Progress Notes (Signed)
CSW received confirmation the CPS report has been accepted.  The assigned case worker is Lisa Joyce [phone]).  There continue to be no barriers to discharge.  

## 2014-05-26 NOTE — Progress Notes (Signed)
Post Partum Day 1 Subjective: Theresa Finley is a 23 y/o G2P2 at 8052w6d s/p SVD. No acute events overnight. Pain is well-controlled. Lochia moderate. No nausea, vomiting or difficulties voiding or ambulating. She has had flatus. She has had a bowel movement. She plans to bottle feed. She is deciding between the Mirena and depot for birth control.    Objective: Blood pressure 106/62, pulse 57, temperature 98.1 F (36.7 C), temperature source Oral, resp. rate 17, height 5\' 4"  (1.626 m), weight 61.236 kg (135 lb), last menstrual period 09/02/2013, SpO2 100 %, unknown if currently breastfeeding.  Physical Exam:  General: alert and cooperative  Cardiovascular: RRR. No murmurs, rubs, or gallops.  Chest: Normal effort. Lungs CTA bilaterally.  Lochia: appropriate Uterine Fundus: firm DVT Evaluation: No evidence of DVT seen on physical exam.   Recent Labs  05/25/14 0350  HGB 12.1  HCT 34.5*    Assessment/Plan: Plan for discharge tomorrow   LOS: 1 day   Theresa Finley, Theresa Finley 05/26/2014, 7:50 AM   I have seen this patient and agree with the above PA student's note.  Theresa Finley, Theresa Finley Certified Nurse-Midwife

## 2014-05-26 NOTE — Plan of Care (Signed)
Problem: Discharge Progression Outcomes Goal: Tolerating diet Outcome: Completed/Met Date Met:  05/26/14 Goal: Pain controlled with appropriate interventions Outcome: Completed/Met Date Met:  05/26/14

## 2014-05-26 NOTE — Progress Notes (Signed)
Clinical Social Work Department PSYCHOSOCIAL ASSESSMENT - MATERNAL/CHILD 05/26/2014  Patient:  Theresa Finley, Theresa Finley  Account Number:  [id number]  Admit Date:  05/25/2014  Ardine Eng Name:   Theresa Finley   Clinical Social Worker:  Lucita Ferrara, CLINICAL SOCIAL WORKER   Date/Time:  05/26/2014 08:45 AM  Date Referred:  05/25/2014   Referral source  Central Nursery     Referred reason  [facility]   Other referral source:    I:  FAMILY / HOME ENVIRONMENT Child's legal guardian:  PARENT  Guardian - Name Guardian - Age Guardian - Address  Theresa Finley [address], [place] [address]  Jamison Oka  different residence   Other household support members/support persons Name Relationship DOB  [place] 2012   Other support:   MOB stated that she is well supported by her family and friends.    II  PSYCHOSOCIAL DATA Information Source:  Patient Interview  Occupational hygienist Employment:   MOB stated that she is unemployed and is looking forward to staying at home.   Financial resources:  Medicaid If Medicaid - County:  [place] / Grade:   Maternity Care Coordinator / Child Services Coordination / Early Interventions:   None reported.  Cultural issues impacting care:   None reported.    III  STRENGTHS Strengths  Adequate Resources  Home prepared for Child (including basic supplies)  Supportive family/friends   Strength comment:    IV  RISK FACTORS AND CURRENT PROBLEMS Current Problem:  YES   Risk Factor & Current Problem Patient Issue Family Issue Risk Factor / Current Problem Comment  Other - See comment Y N MOB presented late for prenatal care.  She arrived at 26 weeks and declined all drug screens.  Baby's UDS is positive for THC.    V  SOCIAL WORK ASSESSMENT CSW met with the MOB in her room in order to complete the assessment.  Consult was ordered due to MOB arriving late to prenatal  care at 26 weeks and refusing all drug screens.  MOB was easily engaged and was receptive to the visit.  She was observed to be attentive and bonding with the baby.  MOB displayed a full range in affect and presented in a pleasant mood.  MOB was a limited historian as she was originally in denial of any THC use, but eventually admitted to [facility] use once CSW shared results of baby's UDS.    MOB smiled as she reflected upon her thoughts and feelings as she transitions into the postpartum period.  She stated that she loves being a mother and is looking forward to having two children.  Per MOB, she lives at home alone, but feels well supported by her family and friends.  MOB denied any stressors related to financial resources despite not working as she believes she knows how to access community resources.  MOB denied any recent stressors that may negatively impact her transition into the postpartum period.    MOB denied mental health history and denied history of postpartum depression.  MOB was receptive to education on postpartum depression and was agreeable to contacting her MD if she experiences symptoms.  MOB stated that she cannot even begin to imagine feeling depressed in the postpartum period since she feels "so happy" as a mother.   MOB originally denied all substance use.  CSW shared hospital drug screen policy due to late prenatal care.  MOB verbalized understanding of drug screen policy.  CSW shared results of baby's UDS, and MOB inquired if it was possible for baby to be positive for THC due to exposure in the environment.  CSW shared that UDS is positive only if MOB directly used.  MOB eventually reported THC use during her pregnancy, but was vague, and did not disclose frequency or last use.  CSW discussed need to make CPS report due to baby's UDS.  MOB expressed normative worry due to CPS involvement since she has not had previous CPS involvement, but she denied any other factors that may cause CPS  concern.   No barriers to discharge.   VI SOCIAL WORK PLAN Social Work Plan  Child Scientist, forensic Report  Patient/Family Education  No Further Intervention Required / No Barriers to Discharge   Type of pt/family education:   Postpartum depression  Hospital drug screen policy   If child protective services report - county:  [place] If child protective services report - date:  05/26/2014 Information/referral to community resources comment:   No referrals at this time.   Other social work plan:   CSW to follow PRN. CPS to follow-up with the MOB in the community.

## 2014-05-27 MED ORDER — IBUPROFEN 600 MG PO TABS: 600.0000 mg | ORAL_TABLET | Freq: Four times a day (QID) | ORAL | Status: DC | PRN

## 2014-05-27 NOTE — Progress Notes (Signed)
Patient called out for pain medication. When RN took medication to the room,  a CPS worker was at bedside for conference with patient.  CPS worker asked me to wait until conference was over. Patient will call out for medication when interview is complete. Boykin PeekNancy Boaz Berisha, RN

## 2014-05-27 NOTE — Discharge Summary (Signed)
Obstetric Discharge Summary Reason for Admission: onset of labor and rupture of membranes Prenatal Procedures: none Intrapartum Procedures: spontaneous vaginal delivery Postpartum Procedures: Cytotec 800 mcg rectally  for increased bldg immed PP (EBL in del 400cc) Complications-Operative and Postpartum: 1st degree labial laceration  23 y/o G2P2 at 7011w6d presented with onset of labor and SROM went on to NSVD with epidural anesthesia. 3 vessel cord spontaneous placenta. EBL:400 mL, 1st degree labial lac repaired. Apgars 9 and 9. Mom is bottle feeding. Wants Mirena for birth control. Had SW involvement and CPS report due to [facility] on pt's UDS and baby's meconium screen- no barriers to discharge however.  HEMOGLOBIN  Date Value Ref Range Status  05/25/2014 12.1 12.0 - 15.0 g/dL Final   HCT  Date Value Ref Range Status  05/25/2014 34.5* 36.0 - 46.0 % Final    Physical Exam:  General: alert and cooperative  Cardiovascular: RRR. No murmurs, rubs, gallops.  Chest: Normal effort. Lungs CTA bilaterally  Lochia: appropriate Uterine Fundus: firm DVT Evaluation: No evidence of DVT seen on physical exam.  Discharge Diagnoses: Term Pregnancy-delivered  Discharge Information: Date: 05/27/2014 Activity: pelvic rest Diet: routine Medications: Ibuprofen Condition: stable Instructions: refer to practice specific booklet Discharge to: home   Newborn Data: Live born female  Birth Weight: 5 lb 7 oz (2466 g) APGAR: 9, 9  Home with mother.  Theresa Finley, Theresa Finley 05/27/2014, 7:47 AM   I have seen and examined this patient and I agree with the above. Cam HaiSHAW, KIMBERLY CNM 10:06 PM 06/03/2014

## 2014-05-27 NOTE — Plan of Care (Signed)
Problem: Consults Goal: Postpartum Patient Education (See Patient Education module for education specifics.)  Outcome: Completed/Met Date Met:  05/27/14  Problem: Discharge Progression Outcomes Goal: Activity appropriate for discharge plan Outcome: Completed/Met Date Met:  05/27/14 Goal: Discharge plan in place and appropriate Outcome: Completed/Met Date Met:  05/27/14

## 2014-05-27 NOTE — Plan of Care (Signed)
Problem: Discharge Progression Outcomes Goal: Barriers To Progression Addressed/Resolved Outcome: Not Applicable Date Met:  83/66/29 Goal: Complications resolved/controlled Outcome: Completed/Met Date Met:  05/27/14 Goal: Afebrile, VS remain stable at discharge Outcome: Completed/Met Date Met:  05/27/14 Goal: Other Discharge Outcomes/Goals Outcome: Completed/Met Date Met:  05/27/14

## 2014-05-27 NOTE — Discharge Instructions (Signed)

## 2014-06-11 ENCOUNTER — Encounter (HOSPITAL_COMMUNITY): Payer: Self-pay | Admitting: Emergency Medicine

## 2014-06-11 ENCOUNTER — Emergency Department (INDEPENDENT_AMBULATORY_CARE_PROVIDER_SITE_OTHER): Payer: Medicaid Other

## 2014-06-11 ENCOUNTER — Emergency Department (INDEPENDENT_AMBULATORY_CARE_PROVIDER_SITE_OTHER)
Admission: EM | Admit: 2014-06-11 | Discharge: 2014-06-11 | Disposition: A | Discharge status: Home / Self Care | Payer: Medicaid Other | Source: Home / Self Care | Attending: Emergency Medicine | Admitting: Emergency Medicine

## 2014-06-11 DIAGNOSIS — M25521 Pain in right elbow: Secondary | ICD-10-CM

## 2014-06-11 DIAGNOSIS — W19XXXA Unspecified fall, initial encounter: Secondary | ICD-10-CM

## 2014-06-11 DIAGNOSIS — R52 Pain, unspecified: Secondary | ICD-10-CM

## 2014-06-11 MED ORDER — HYDROCODONE-ACETAMINOPHEN 5-325 MG PO TABS
ORAL_TABLET | ORAL | Status: AC
  Filled 2014-06-11: qty 1

## 2014-06-11 MED ORDER — IBUPROFEN 800 MG PO TABS: 800.0000 mg | ORAL_TABLET | Freq: Three times a day (TID) | ORAL | Status: DC | PRN

## 2014-06-11 MED ORDER — IBUPROFEN 800 MG PO TABS
ORAL_TABLET | ORAL | Status: AC
Start: 2014-06-11 — End: 2014-06-11
  Filled 2014-06-11: qty 1

## 2014-06-11 MED ORDER — IBUPROFEN 800 MG PO TABS
800.0000 mg | ORAL_TABLET | Freq: Once | ORAL | Status: AC
  Administered 2014-06-11: 800 mg via ORAL

## 2014-06-11 MED ORDER — HYDROCODONE-ACETAMINOPHEN 5-325 MG PO TABS: 1.0000 | ORAL_TABLET | Freq: Four times a day (QID) | ORAL | Status: DC | PRN

## 2014-06-11 MED ORDER — HYDROCODONE-ACETAMINOPHEN 5-325 MG PO TABS
1.0000 | ORAL_TABLET | Freq: Once | ORAL | Status: AC
  Administered 2014-06-11: 1 via ORAL

## 2014-06-11 NOTE — Discharge Instructions (Signed)
Follow up with your own orthopedist or with [facility] to have your elbow checked out.    Arthralgia Arthralgia is joint pain. A joint is a place where two bones meet. Joint pain can happen for many reasons. The joint can be bruised, stiff, infected, or weak from aging. Pain usually goes away after resting and taking medicine for soreness.  HOME CARE  Rest the joint as told by your doctor.  Keep the sore joint raised (elevated) for the first 24 hours.  Put ice on the joint area.  Put ice in a plastic bag.  Place a towel between your skin and the bag.  Leave the ice on for 15-20 minutes, 03-04 times a day.  Wear your splint, casting, elastic bandage, or sling as told by your doctor.  Only take medicine as told by your doctor. Do not take aspirin.  Use crutches as told by your doctor. Do not put weight on the joint until told to by your doctor. GET HELP RIGHT AWAY IF:   You have bruising, puffiness (swelling), or more pain.  Your fingers or toes turn blue or start to lose feeling (numb).  Your medicine does not lessen the pain.  Your pain becomes severe.  You have a temperature by mouth above 102 F (38.9 C), not controlled by medicine.  You cannot move or use the joint. MAKE SURE YOU:   Understand these instructions.  Will watch your condition.  Will get help right away if you are not doing well or get worse. Document Released: 06/28/2009 Document Revised: 10/02/2011 Document Reviewed: 06/28/2009 [facility] Patient Information 2015 [employer] [place]. This information is not intended to replace advice given to you by your health care provider. Make sure you discuss any questions you have with your health care provider.
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
## 2015-04-15 ENCOUNTER — Emergency Department (HOSPITAL_COMMUNITY): Payer: Medicaid Other

## 2015-04-15 ENCOUNTER — Emergency Department (HOSPITAL_COMMUNITY)
Admission: EM | Admit: 2015-04-15 | Discharge: 2015-04-15 | Disposition: A | Discharge status: Home / Self Care | Payer: Medicaid Other | Attending: Emergency Medicine | Admitting: Emergency Medicine

## 2015-04-15 ENCOUNTER — Encounter (HOSPITAL_COMMUNITY): Payer: Self-pay

## 2015-04-15 ENCOUNTER — Emergency Department (HOSPITAL_COMMUNITY)
Admission: EM | Admit: 2015-04-15 | Discharge: 2015-04-15 | Disposition: A | Discharge status: Home / Self Care | Payer: Medicaid Other | Source: Home / Self Care

## 2015-04-15 DIAGNOSIS — Z87891 Personal history of nicotine dependence: Secondary | ICD-10-CM | POA: Diagnosis not present

## 2015-04-15 DIAGNOSIS — S0990XA Unspecified injury of head, initial encounter: Secondary | ICD-10-CM | POA: Diagnosis not present

## 2015-04-15 DIAGNOSIS — Y9389 Activity, other specified: Secondary | ICD-10-CM | POA: Diagnosis not present

## 2015-04-15 DIAGNOSIS — Y9289 Other specified places as the place of occurrence of the external cause: Secondary | ICD-10-CM | POA: Insufficient documentation

## 2015-04-15 DIAGNOSIS — M79661 Pain in right lower leg: Secondary | ICD-10-CM

## 2015-04-15 DIAGNOSIS — R55 Syncope and collapse: Secondary | ICD-10-CM | POA: Diagnosis present

## 2015-04-15 DIAGNOSIS — S8991XA Unspecified injury of right lower leg, initial encounter: Secondary | ICD-10-CM | POA: Insufficient documentation

## 2015-04-15 DIAGNOSIS — W01198A Fall on same level from slipping, tripping and stumbling with subsequent striking against other object, initial encounter: Secondary | ICD-10-CM | POA: Diagnosis not present

## 2015-04-15 DIAGNOSIS — S01511A Laceration without foreign body of lip, initial encounter: Secondary | ICD-10-CM | POA: Insufficient documentation

## 2015-04-15 DIAGNOSIS — W19XXXA Unspecified fall, initial encounter: Secondary | ICD-10-CM

## 2015-04-15 DIAGNOSIS — S59901A Unspecified injury of right elbow, initial encounter: Secondary | ICD-10-CM | POA: Diagnosis not present

## 2015-04-15 DIAGNOSIS — M25521 Pain in right elbow: Secondary | ICD-10-CM

## 2015-04-15 DIAGNOSIS — Y998 Other external cause status: Secondary | ICD-10-CM | POA: Insufficient documentation

## 2015-04-15 MED ORDER — TETANUS-DIPHTH-ACELL PERTUSSIS 5-2.5-18.5 LF-MCG/0.5 IM SUSP
0.5000 mL | Freq: Once | INTRAMUSCULAR | Status: AC
  Administered 2015-04-15: 0.5 mL via INTRAMUSCULAR
  Filled 2015-04-15: qty 0.5

## 2015-04-15 MED ORDER — NAPROXEN 500 MG PO TABS
500.0000 mg | ORAL_TABLET | Freq: Once | ORAL | Status: AC
  Administered 2015-04-15: 500 mg via ORAL
  Filled 2015-04-15: qty 1

## 2015-04-15 MED ORDER — NAPROXEN 250 MG PO TABS: 250.0000 mg | ORAL_TABLET | Freq: Two times a day (BID) | ORAL | Status: DC

## 2015-04-15 MED ORDER — IBUPROFEN 800 MG PO TABS
800.0000 mg | ORAL_TABLET | Freq: Once | ORAL | Status: DC
  Filled 2015-04-15: qty 1

## 2015-04-15 MED ORDER — LIDOCAINE HCL (PF) 1 % IJ SOLN
5.0000 mL | Freq: Once | INTRAMUSCULAR | Status: AC
  Administered 2015-04-15: 5 mL
  Filled 2015-04-15: qty 5

## 2015-04-15 NOTE — ED Notes (Signed)
Suture cart outside pt room 

## 2015-04-15 NOTE — ED Provider Notes (Signed)
CSN: [id number]     Arrival date & time 04/15/15  1411 History   First MD Initiated Contact with Patient 04/15/15 1532     Chief Complaint  Patient presents with  . Facial Injury  . Leg Injury  . Loss of Consciousness   Theresa Finley is a 24 y.o. female who is otherwise healthy who presents to the ED after she slipped and fell while running in her flip flops around 12:00 pm today. The patient reports she fell on rocks and hit her left lip, left head, right elbow and right lower leg. She reports she lost consciousness and does not remember the walk home. Her fall was unwitnessed. She complains of 10/10 pain to her right lower leg and left lip. She also reports a mild headache. She also reports mild pain to her right elbow, but reports she does not think it is broken and does not want x-rays of her elbow. The patient denies fevers, double vision, neck pain, back pain, numbness, tingling, weakness, abdominal pain, nausea, vomiting, or rashes. She is not sure when her last Tdap was.   (Consider location/radiation/quality/duration/timing/severity/associated sxs/prior Treatment) HPI  Past Medical History  Diagnosis Date  . Medical history non-contributory    Past Surgical History  Procedure Laterality Date  . Orif radial fracture  07/27/2012    Procedure: OPEN REDUCTION INTERNAL FIXATION (ORIF) RADIAL FRACTURE;  Surgeon: Dominica Severin, MD;  Location: [facility] OR;  Service: Orthopedics;  Laterality: Right;  ORIF Right Radial Head Fracture  . No past surgeries     Family History  Problem Relation Age of Onset  . Cancer Mother    Social History  Substance Use Topics  . Smoking status: Former Games developer  . Smokeless tobacco: None     Comment: Patient reports that she rarly smokes  . Alcohol Use: No   OB History    Gravida Para Term Preterm AB TAB SAB Ectopic Multiple Living   0 0 0 0 0 0 2     Review of Systems  Constitutional: Negative for fever.  HENT: Negative for congestion, ear  discharge and sore throat.   Eyes: Negative for pain and visual disturbance.  Respiratory: Negative for cough, shortness of breath and wheezing.   Cardiovascular: Negative for chest pain.  Gastrointestinal: Negative for nausea, vomiting and abdominal pain.  Genitourinary: Negative for dysuria and difficulty urinating.  Musculoskeletal: Positive for arthralgias. Negative for back pain and neck pain.  Skin: Positive for wound. Negative for rash.  Neurological: Positive for syncope and headaches. Negative for dizziness, weakness, light-headedness and numbness.      Allergies  Review of patient's allergies indicates no known allergies.  Home Medications   Prior to Admission medications   Medication Sig Start Date End Date Taking? Authorizing Provider  acetaminophen (TYLENOL) 500 MG tablet Take 1,500 mg by mouth every 6 (six) hours as needed for moderate pain.   Yes Historical Provider, MD  aspirin-acetaminophen-caffeine (EXCEDRIN MIGRAINE) [phone] MG per tablet Take 1 tablet by mouth every 6 (six) hours as needed for headache.   Yes Historical Provider, MD  ibuprofen (ADVIL,MOTRIN) 200 MG tablet Take 200 mg by mouth every 6 (six) hours as needed for moderate pain.   Yes Historical Provider, MD  naproxen (NAPROSYN) 250 MG tablet Take 1 tablet (250 mg total) by mouth 2 (two) times daily with a meal. 04/15/15   Everlene Farrier, PA-C   BP 122/75 mmHg  Pulse 84  Temp(Src) 98.4 F (36.9 C) (Oral)  Resp 18  SpO2 99% Physical Exam  Constitutional: She is oriented to person, place, and time. She appears well-developed and well-nourished. No distress.  Nontoxic appearing.  HENT:  Head: Normocephalic.  Right Ear: External ear normal.  Left Ear: External ear normal.  Nose: Nose normal.  Mouth/Throat: Oropharynx is clear and moist.  1 cm laceration above her left lip. Head is otherwise atraumatic. Bilateral tympanic membranes are pearly-gray without erythema or loss of landmarks.   Eyes:  Conjunctivae and EOM are normal. Pupils are equal, round, and reactive to light. Right eye exhibits no discharge. Left eye exhibits no discharge.  Neck: Normal range of motion. Neck supple. No tracheal deviation present.  No midline neck tenderness.  Cardiovascular: Normal rate, regular rhythm, normal heart sounds and intact distal pulses.  Exam reveals no gallop and no friction rub.   No murmur heard. Bilateral radial and posterior tibialis pulses are intact.  Pulmonary/Chest: Effort normal and breath sounds normal. No respiratory distress. She has no wheezes. She has no rales.  Lungs are clear to auscultation bilaterally.  Abdominal: Soft. There is no tenderness. There is no guarding.  Musculoskeletal: Normal range of motion. She exhibits tenderness. She exhibits no edema.  Mild tenderness to her right elbow. No right elbow edema, deformity, ecchymosis or erythema. Patient has good range of motion of her right elbow. No right forearm or wrist bony point tenderness. Tenderness over her right distal lower leg with an area of ecchymosis.  No deformity. No midline neck or back tenderness.   Lymphadenopathy:    She has no cervical adenopathy.  Neurological: She is alert and oriented to person, place, and time. No cranial nerve deficit. Coordination normal.  The patient is alert and oriented 3. Cranial nerves are intact. No pronator drift. Finger-to-nose intact bilaterally. Sensation intact to her bilateral upper and lower extremities.  Skin: Skin is warm and dry. No rash noted. She is not diaphoretic. No erythema. No pallor.  Psychiatric: She has a normal mood and affect. Her behavior is normal.  Nursing note and vitals reviewed.   ED Course  LACERATION REPAIR Date/Time: 04/15/2015 5:25 PM Performed by: Everlene Farrier Authorized by: Everlene Farrier Consent: Verbal consent obtained. Risks and benefits: risks, benefits and alternatives were discussed Consent given by: patient Patient  understanding: patient states understanding of the procedure being performed Patient consent: the patient's understanding of the procedure matches consent given Procedure consent: procedure consent matches procedure scheduled Relevant documents: relevant documents present and verified Test results: test results available and properly labeled Site marked: the operative site was marked Required items: required blood products, implants, devices, and special equipment available Patient identity confirmed: verbally with patient Time out: Immediately prior to procedure a "time out" was called to verify the correct patient, procedure, equipment, support staff and site/side marked as required. Body area: head/neck Location details: upper lip Full thickness lip laceration: no Vermillion border involved: no Laceration length: 1.5 cm Foreign bodies: no foreign bodies Tendon involvement: none Nerve involvement: none Vascular damage: no Anesthesia: local infiltration Local anesthetic: lidocaine 1% without epinephrine Anesthetic total: 1 ml Patient sedated: no Preparation: Patient was prepped and draped in the usual sterile fashion. Irrigation solution: saline Irrigation method: jet lavage Amount of cleaning: standard Debridement: none Degree of undermining: none Skin closure: 6-0 Prolene Number of sutures: 2 Technique: simple Approximation: close Approximation difficulty: simple Dressing: non-adhesive packing strip Patient tolerance: Patient tolerated the procedure well with no immediate complications   (including critical care time) Labs Review Labs  Reviewed - No data to display  Imaging Review Dg Tibia/fibula Right  04/15/2015   CLINICAL DATA:  Patient fell.  Right leg pain.  EXAM: RIGHT TIBIA AND FIBULA - 2 VIEW  COMPARISON:  None.  FINDINGS: There is no evidence of fracture or other focal bone lesions. Soft tissues are unremarkable.  IMPRESSION: Negative.   Electronically Signed    By: Amie Portland M.D.   On: 04/15/2015 16:20   Ct Head Wo Contrast  04/15/2015   CLINICAL DATA:  24 year old female with acute head injury, loss of consciousness and headache following fall today. Initial encounter.  EXAM: CT HEAD WITHOUT CONTRAST  TECHNIQUE: Contiguous axial images were obtained from the base of the skull through the vertex without intravenous contrast.  COMPARISON:  None.  FINDINGS: No intracranial abnormalities are identified, including mass lesion or mass effect, hydrocephalus, extra-axial fluid collection, midline shift, hemorrhage, or acute infarction.  The visualized bony calvarium is unremarkable.  IMPRESSION: Unremarkable noncontrast head CT.   Electronically Signed   By: Harmon Pier M.D.   On: 04/15/2015 17:04   I have personally reviewed and evaluated these images as part of my medical decision-making.   EKG Interpretation None      Filed Vitals:   04/15/15 1430  BP: 122/75  Pulse: 84  Temp: 98.4 F (36.9 C)  TempSrc: Oral  Resp: 18  SpO2: 99%     MDM   Meds given in ED:  Medications  lidocaine (PF) (XYLOCAINE) 1 % injection 5 mL (5 mLs Infiltration Given 04/15/15 1651)  Tdap (BOOSTRIX) injection 0.5 mL (0.5 mLs Intramuscular Given 04/15/15 1643)  naproxen (NAPROSYN) tablet 500 mg (500 mg Oral Given 04/15/15 1643)    New Prescriptions   NAPROXEN (NAPROSYN) 250 MG TABLET    Take 1 tablet (250 mg total) by mouth 2 (two) times daily with a meal.    Final diagnoses:  Lip laceration, initial encounter  Right elbow pain  Pain of right lower leg  Fall, initial encounter   This is a 25 y.o. female who is otherwise healthy who presents to the ED after she slipped and fell while running in her flip flops around 12:00 pm today. The patient reports she fell on rocks and hit her left lip, left head, right elbow and right lower leg. She reports she lost consciousness and does not remember the walk home. Her fall was unwitnessed. She complains of 10/10 pain to her  right lower leg and left lip. She also reports a mild headache. She also reports mild pain to her right elbow, but reports she does not think it is broken and does not want x-rays of her elbow.  On exam patient is afebrile nontoxic appearing. She has no focal neurological deficits. She has good range of motion of her right elbow and her right knee. There is mild tenderness to her right elbow without erythema, ecchymosis or edema. Patient has tenderness to her right lower shin with an area of overlying ecchymosis. No deformity noted. Patient is [place] to answer without difficulty or assistance. The patient has a 1 cm laceration to her left upper lip. This does not involve the vermilion border. This was repaired by me and tolerated well by the patient. Advised to return in 5-7 days to have her sutures removed. Patient had her tetanus updated in the emergency department today. CT head was obtained due to her loss of consciousness which is unremarkable. Right tibia-fibula is also unremarkable. Initially the patient declined x-rays of  her elbow. Later she requested an x-ray of her elbow. She then decided she did not want an x-ray of her elbow prior to discharge. She requested to be discharged after laceration repair and reports she will follow-up with her orthopedic surgeon for her elbow pain. Patient discharged with prescription for naproxen. I advised the patient to follow-up with their primary care provider this week. I advised the patient to return to the emergency department with new or worsening symptoms or new concerns. The patient verbalized understanding and agreement with plan.       Everlene Farrier, PA-C 04/15/15 1749  Melene Plan, DO 04/15/15 2326

## 2015-04-15 NOTE — Discharge Instructions (Signed)
Facial Laceration ° A facial laceration is a cut on the face. These injuries can be painful and cause bleeding. Lacerations usually heal quickly, but they need special care to reduce scarring. °DIAGNOSIS  °Your health care provider will take a medical history, ask for details about how the injury occurred, and examine the wound to determine how deep the cut is. °TREATMENT  °Some facial lacerations may not require closure. Others may not be [place] to be closed because of an increased risk of infection. The risk of infection and the chance for successful closure will depend on various factors, including the amount of time since the injury occurred. °The wound may be cleaned to help prevent infection. If closure is appropriate, pain medicines may be given if needed. Your health care provider will use stitches (sutures), wound glue (adhesive), or skin adhesive strips to repair the laceration. These tools bring the skin edges together to allow for faster healing and a better cosmetic outcome. If needed, you may also be given a tetanus shot. °HOME CARE INSTRUCTIONS °· Only take over-the-counter or prescription medicines as directed by your health care provider. °· Follow your health care provider's instructions for wound care. These instructions will vary depending on the technique used for closing the wound. °For Sutures: °· Keep the wound clean and dry.   °· If you were given a bandage (dressing), you should change it at least once a day. Also change the dressing if it becomes wet or dirty, or as directed by your health care provider.   °· Wash the wound with soap and water 2 times a day. Rinse the wound off with water to remove all soap. Pat the wound dry with a clean towel.   °· After cleaning, apply a thin layer of the antibiotic ointment recommended by your health care provider. This will help prevent infection and keep the dressing from sticking.   °· You may shower as usual after the first 24 hours. Do not soak the  wound in water until the sutures are removed.   °· Get your sutures removed as directed by your health care provider. With facial lacerations, sutures should usually be taken out after 4-5 days to avoid stitch marks.   °· Wait a few days after your sutures are removed before applying any makeup. °For Skin Adhesive Strips: °· Keep the wound clean and dry.   °· Do not get the skin adhesive strips wet. You may bathe carefully, using caution to keep the wound dry.   °· If the wound gets wet, pat it dry with a clean towel.   °· Skin adhesive strips will fall off on their own. You may trim the strips as the wound heals. Do not remove skin adhesive strips that are still stuck to the wound. They will fall off in time.   °For Wound Adhesive: °· You may briefly wet your wound in the shower or bath. Do not soak or scrub the wound. Do not swim. Avoid periods of heavy sweating until the skin adhesive has fallen off on its own. After showering or bathing, gently pat the wound dry with a clean towel.   °· Do not apply liquid medicine, cream medicine, ointment medicine, or makeup to your wound while the skin adhesive is in place. This may loosen the film before your wound is healed.   °· If a dressing is placed over the wound, be careful not to apply tape directly over the skin adhesive. This may cause the adhesive to be pulled off before the wound is healed.   °· Avoid   prolonged exposure to sunlight or tanning lamps while the skin adhesive is in place.  The skin adhesive will usually remain in place for 5-10 days, then naturally fall off the skin. Do not pick at the adhesive film.  After Healing: Once the wound has healed, cover the wound with sunscreen during the day for 1 full year. This can help minimize scarring. Exposure to ultraviolet light in the first year will darken the scar. It can take 1-2 years for the scar to lose its redness and to heal completely.  SEEK IMMEDIATE MEDICAL CARE IF:  You have redness, pain, or  swelling around the wound.   You see ayellowish-white fluid (pus) coming from the wound.   You have chills or a fever.  MAKE SURE YOU:  Understand these instructions.  Will watch your condition.  Will get help right away if you are not doing well or get worse. Document Released: 08/17/2004 Document Revised: 04/30/2013 Document Reviewed: 02/20/2013 [facility] Patient Information 2015 [place], [place]. This information is not intended to replace advice given to you by your health care provider. Make sure you discuss any questions you have with your health care provider. Elbow Contusion An elbow contusion is a deep bruise of the elbow. Contusions are the result of an injury that caused bleeding under the skin. The contusion may turn blue, purple, or yellow. Minor injuries will give you a painless contusion, but more severe contusions may stay painful and swollen for a few weeks.  CAUSES  An elbow contusion comes from a direct force to that area, such as falling on the elbow. SYMPTOMS   Swelling and redness of the elbow.  Bruising of the elbow area.  Tenderness or soreness of the elbow. DIAGNOSIS  You will have a physical exam and will be asked about your history. You may need an X-ray of your elbow to look for a broken bone (fracture).  TREATMENT  A sling or splint may be needed to support your injury. Resting, elevating, and applying cold compresses to the elbow area are often the best treatments for an elbow contusion. Over-the-counter medicines may also be recommended for pain control. HOME CARE INSTRUCTIONS   Put ice on the injured area.  Put ice in a plastic bag.  Place a towel between your skin and the bag.  Leave the ice on for 15-20 minutes, 03-04 times a day.  Only take over-the-counter or prescription medicines for pain, discomfort, or fever as directed by your caregiver.  Rest your injured elbow until the pain and swelling are better.  Elevate your elbow to reduce  swelling.  Apply a compression wrap as directed by your caregiver. This can help reduce swelling and motion. You may remove the wrap for sleeping, showers, and baths. If your fingers become numb, cold, or blue, take the wrap off and reapply it more loosely.  Use your elbow only as directed by your caregiver. You may be asked to do range of motion exercises. Do them as directed.  See your caregiver as directed. It is very important to keep all follow-up appointments in order to avoid any long-term problems with your elbow, including chronic pain or inability to move your elbow normally. SEEK IMMEDIATE MEDICAL CARE IF:   You have increased redness, swelling, or pain in your elbow.  Your swelling or pain is not relieved with medicines.  You have swelling of the hand and fingers.  You are unable to move your fingers or wrist.  You begin to lose feeling  in your hand or fingers.  Your fingers or hand become cold or blue. MAKE SURE YOU:   Understand these instructions.  Will watch your condition.  Will get help right away if you are not doing well or get worse. Document Released: 06/18/2006 Document Revised: 10/02/2011 Document Reviewed: 05/26/2011 [facility] Patient Information 2015 [place], [place]. This information is not intended to replace advice given to you by your health care provider. Make sure you discuss any questions you have with your health care provider. Fall Prevention and Home Safety Falls cause injuries and can affect all age groups. It is possible to use preventive measures to significantly decrease the likelihood of falls. There are many simple measures which can make your home safer and prevent falls. OUTDOORS  Repair cracks and edges of walkways and driveways.  Remove high doorway thresholds.  Trim shrubbery on the main path into your home.  Have good outside lighting.  Clear walkways of tools, rocks, debris, and clutter.  Check that handrails are not broken and  are securely fastened. Both sides of steps should have handrails.  Have leaves, snow, and ice cleared regularly.  Use sand or salt on walkways during winter months.  In the garage, clean up grease or oil spills. BATHROOM  Install night lights.  Install grab bars by the toilet and in the tub and shower.  Use non-skid mats or decals in the tub or shower.  Place a plastic non-slip stool in the shower to sit on, if needed.  Keep floors dry and clean up all water on the floor immediately.  Remove soap buildup in the tub or shower on a regular basis.  Secure bath mats with non-slip, double-sided rug tape.  Remove throw rugs and tripping hazards from the floors. BEDROOMS  Install night lights.  Make sure a bedside light is easy to reach.  Do not use oversized bedding.  Keep a telephone by your bedside.  Have a firm chair with side arms to use for getting dressed.  Remove throw rugs and tripping hazards from the floor. KITCHEN  Keep handles on pots and pans turned toward the center of the stove. Use back burners when possible.  Clean up spills quickly and allow time for drying.  Avoid walking on wet floors.  Avoid hot utensils and knives.  Position shelves so they are not too high or low.  Place commonly used objects within easy reach.  If necessary, use a sturdy step stool with a grab bar when reaching.  Keep electrical cables out of the way.  Do not use floor polish or wax that makes floors slippery. If you must use wax, use non-skid floor wax.  Remove throw rugs and tripping hazards from the floor. STAIRWAYS  Never leave objects on stairs.  Place handrails on both sides of stairways and use them. Fix any loose handrails. Make sure handrails on both sides of the stairways are as long as the stairs.  Check carpeting to make sure it is firmly attached along stairs. Make repairs to worn or loose carpet promptly.  Avoid placing throw rugs at the top or bottom  of stairways, or properly secure the rug with carpet tape to prevent slippage. Get rid of throw rugs, if possible.  Have an electrician put in a light switch at the top and bottom of the stairs. OTHER FALL PREVENTION TIPS  Wear low-heel or rubber-soled shoes that are supportive and fit well. Wear closed toe shoes.  When using a stepladder, make sure it  is fully opened and both spreaders are firmly locked. Do not climb a closed stepladder.  Add color or contrast paint or tape to grab bars and handrails in your home. Place contrasting color strips on first and last steps.  Learn and use mobility aids as needed. Install an electrical emergency response system.  Turn on lights to avoid dark areas. Replace light bulbs that burn out immediately. Get light switches that glow.  Arrange furniture to create clear pathways. Keep furniture in the same place.  Firmly attach carpet with non-skid or double-sided tape.  Eliminate uneven floor surfaces.  Select a carpet pattern that does not visually hide the edge of steps.  Be aware of all pets. OTHER HOME SAFETY TIPS  Set the water temperature for 120 F (48.8 C).  Keep emergency numbers on or near the telephone.  Keep smoke detectors on every level of the home and near sleeping areas. Document Released: 06/30/2002 Document Revised: 01/09/2012 Document Reviewed: 09/29/2011 [facility] Patient Information 2015 [place], [place]. This information is not intended to replace advice given to you by your health care provider. Make sure you discuss any questions you have with your health care provider.

## 2015-04-15 NOTE — ED Notes (Signed)
Pt refusing tetanus presently d/t not receiving pain medicine.

## 2015-04-15 NOTE — ED Notes (Signed)
Patient left without being seen stated that she couldn't wait
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
## 2015-04-15 NOTE — ED Notes (Signed)
Not done per Will, PA-C.

## 2015-04-15 NOTE — ED Notes (Signed)
Patient transported to CT 

## 2015-04-22 ENCOUNTER — Emergency Department (HOSPITAL_COMMUNITY)
Admission: EM | Admit: 2015-04-22 | Discharge: 2015-04-22 | Disposition: A | Discharge status: Home / Self Care | Payer: Medicaid Other | Attending: Emergency Medicine | Admitting: Emergency Medicine

## 2015-04-22 ENCOUNTER — Encounter (HOSPITAL_COMMUNITY): Payer: Self-pay | Admitting: Emergency Medicine

## 2015-04-22 DIAGNOSIS — Z791 Long term (current) use of non-steroidal anti-inflammatories (NSAID): Secondary | ICD-10-CM | POA: Insufficient documentation

## 2015-04-22 DIAGNOSIS — Z87891 Personal history of nicotine dependence: Secondary | ICD-10-CM | POA: Insufficient documentation

## 2015-04-22 DIAGNOSIS — Z4802 Encounter for removal of sutures: Secondary | ICD-10-CM | POA: Diagnosis present

## 2015-04-22 NOTE — ED Notes (Signed)
Pt here for suture removal to left side of lip placed at [facility]

## 2015-04-22 NOTE — ED Notes (Signed)
Pt left before receiving discharge instructions.

## 2015-04-22 NOTE — ED Provider Notes (Signed)
CSN: [id number]     Arrival date & time 04/22/15  1054 History  By signing my name below, I, Emmanuella Mensah, attest that this documentation has been prepared under the direction and in the presence of [employer], PA-C. Electronically Signed: Angelene Giovanni, ED Scribe. 04/22/2015. 12:02 PM.      Chief Complaint  Patient presents with  . Suture / Staple Removal   The history is provided by the patient. No language interpreter was used.   HPI Comments: Theresa Finley is a 24 y.o. female who presents to the Emergency Department for a suture removal to the left side of lip. She reports that the sutures were placed after she had a lac on her lip 7 days prior. Patient denies any signs of infection, pain, or any other concerning signs or symptoms.  Past Medical History  Diagnosis Date  . Medical history non-contributory    Past Surgical History  Procedure Laterality Date  . Orif radial fracture  07/27/2012    Procedure: OPEN REDUCTION INTERNAL FIXATION (ORIF) RADIAL FRACTURE;  Surgeon: Dominica Severin, MD;  Location: [facility] OR;  Service: Orthopedics;  Laterality: Right;  ORIF Right Radial Head Fracture  . No past surgeries     Family History  Problem Relation Age of Onset  . Cancer Mother    Social History  Substance Use Topics  . Smoking status: Former Games developer  . Smokeless tobacco: None     Comment: Patient reports that she rarly smokes  . Alcohol Use: No   OB History    Gravida Para Term Preterm AB TAB SAB Ectopic Multiple Living   0 0 0 0 0 0 2     Review of Systems  All other systems reviewed and are negative.     Allergies  Review of patient's allergies indicates no known allergies.  Home Medications   Prior to Admission medications   Medication Sig Start Date End Date Taking? Authorizing Provider  acetaminophen (TYLENOL) 500 MG tablet Take 1,500 mg by mouth every 6 (six) hours as needed for moderate pain.    Historical Provider, MD   aspirin-acetaminophen-caffeine (EXCEDRIN MIGRAINE) [phone] MG per tablet Take 1 tablet by mouth every 6 (six) hours as needed for headache.    Historical Provider, MD  ibuprofen (ADVIL,MOTRIN) 200 MG tablet Take 200 mg by mouth every 6 (six) hours as needed for moderate pain.    Historical Provider, MD  naproxen (NAPROSYN) 250 MG tablet Take 1 tablet (250 mg total) by mouth 2 (two) times daily with a meal. 04/15/15   Everlene Farrier, PA-C   BP 116/64 mmHg  Pulse 72  Temp(Src) 98.7 F (37.1 C) (Oral)  Resp 16  Ht  (1.626 m)  Wt 125 lb (56.7 kg)  BMI 21.45 kg/m2  SpO2 100% Physical Exam  Constitutional: She is oriented to person, place, and time. She appears well-developed and well-nourished. No distress.  HENT:  Head: Normocephalic and atraumatic.  Eyes: Conjunctivae and EOM are normal.  Neck: Neck supple. No tracheal deviation present.  Cardiovascular: Normal rate.   Pulmonary/Chest: Effort normal. No respiratory distress.  Musculoskeletal: Normal range of motion.  Neurological: She is alert and oriented to person, place, and time.  Skin: Skin is warm and dry.  2 sutures removed .5cm healed lac, no infection  Psychiatric: She has a normal mood and affect. Her behavior is normal.  Nursing note and vitals reviewed.   ED Course  Procedures (including critical care time) DIAGNOSTIC STUDIES: Oxygen  Saturation is 100% on RA, normal by my interpretation.    COORDINATION OF CARE: 11:51 AM- Pt advised of plan for treatment and pt agrees.   Labs Review Labs Reviewed - No data to display  Imaging Review No results found. Eyvonne Mechanic, PA-C has personally reviewed and evaluated these images and lab results as part of his medical decision-making.   EKG Interpretation None      MDM   Final diagnoses:  Visit for suture removal  Labs:  Imaging:  Consults:  Therapeutics:  Discharge Meds:   Assessment/Plan: 2 sutures removed without difficulty, no signs of  infection.       I personally performed the services described in this documentation, which was scribed in my presence. The recorded information has been reviewed and is accurate.    Eyvonne Mechanic, PA-C 04/22/15 1706  Gilda Crease, MD 04/23/15 [phone]

## 2015-04-22 NOTE — ED Notes (Signed)
Called for in lobby. No answer.

## 2015-04-22 NOTE — ED Notes (Signed)
Pt left without discharge instructions.

## 2015-12-15 ENCOUNTER — Emergency Department (HOSPITAL_COMMUNITY)
Admission: EM | Admit: 2015-12-15 | Discharge: 2015-12-15 | Disposition: A | Discharge status: Home / Self Care | Payer: Medicaid Other | Attending: Emergency Medicine | Admitting: Emergency Medicine

## 2015-12-15 ENCOUNTER — Emergency Department (HOSPITAL_COMMUNITY): Payer: Medicaid Other

## 2015-12-15 ENCOUNTER — Encounter (HOSPITAL_COMMUNITY): Payer: Self-pay | Admitting: Emergency Medicine

## 2015-12-15 DIAGNOSIS — Z87891 Personal history of nicotine dependence: Secondary | ICD-10-CM | POA: Diagnosis not present

## 2015-12-15 DIAGNOSIS — W25XXXA Contact with sharp glass, initial encounter: Secondary | ICD-10-CM | POA: Diagnosis not present

## 2015-12-15 DIAGNOSIS — Y9389 Activity, other specified: Secondary | ICD-10-CM | POA: Insufficient documentation

## 2015-12-15 DIAGNOSIS — Y999 Unspecified external cause status: Secondary | ICD-10-CM | POA: Insufficient documentation

## 2015-12-15 DIAGNOSIS — S91311A Laceration without foreign body, right foot, initial encounter: Secondary | ICD-10-CM | POA: Insufficient documentation

## 2015-12-15 DIAGNOSIS — S42401A Unspecified fracture of lower end of right humerus, initial encounter for closed fracture: Secondary | ICD-10-CM | POA: Diagnosis present

## 2015-12-15 DIAGNOSIS — Y92009 Unspecified place in unspecified non-institutional (private) residence as the place of occurrence of the external cause: Secondary | ICD-10-CM | POA: Insufficient documentation

## 2015-12-15 MED ORDER — BACITRACIN ZINC 500 UNIT/GM EX OINT: 1.0000 "application " | TOPICAL_OINTMENT | Freq: Two times a day (BID) | CUTANEOUS | Status: DC

## 2015-12-15 MED ORDER — IBUPROFEN 800 MG PO TABS
800.0000 mg | ORAL_TABLET | Freq: Once | ORAL | Status: AC
  Administered 2015-12-15: 800 mg via ORAL
  Filled 2015-12-15: qty 1

## 2015-12-15 MED ORDER — NAPROXEN 250 MG PO TABS: 250.0000 mg | ORAL_TABLET | Freq: Two times a day (BID) | ORAL | Status: DC

## 2015-12-15 MED ORDER — HYDROCODONE-ACETAMINOPHEN 5-325 MG PO TABS: 1.0000 | ORAL_TABLET | Freq: Four times a day (QID) | ORAL | Status: DC | PRN

## 2015-12-15 MED ORDER — ACETAMINOPHEN 325 MG PO TABS
650.0000 mg | ORAL_TABLET | Freq: Once | ORAL | Status: DC
  Filled 2015-12-15: qty 2

## 2015-12-15 MED ORDER — HYDROCODONE-ACETAMINOPHEN 5-325 MG PO TABS
1.0000 | ORAL_TABLET | Freq: Once | ORAL | Status: AC
  Administered 2015-12-15: 1 via ORAL
  Filled 2015-12-15: qty 1

## 2015-12-15 NOTE — ED Notes (Signed)
PT REFUSED TO GO TO XRAY STATING, "I WANT HIM TO XRAY MY BACK AND MY BUTT-BONE." PA WILLIAM MADE AWARE.

## 2015-12-15 NOTE — ED Notes (Signed)
PT REFUSED TO GO TO XRAY UNTIL SHE IS MEDICATED FOR PAIN. PA WILLIAM MADE AWARE.
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
## 2015-12-15 NOTE — ED Notes (Signed)
PT DISCHARGED. INSTRUCTIONS AND PRESCRIPTIONS GIVEN. AAOX4. PT IN NO APPARENT DISTRESS. THE OPPORTUNITY TO ASK QUESTIONS WAS PROVIDED. 

## 2015-12-15 NOTE — Discharge Instructions (Signed)
Possible Elbow Fracture A radial head fracture is a break of the smaller bone (radius) in the forearm. The head of this bone is the part near the elbow. These fractures commonly happen during a fall, when you land on an outstretched arm. These fractures are more common in middle aged adults and are common with a dislocation of the elbow. SYMPTOMS   Swelling of the elbow joint and pain on the outside of the elbow.  Pain and difficulty in bending or straightening the elbow.  Pain and difficulty in turning the palm of the hand up or down with the elbow bent. DIAGNOSIS  Your caregiver may make this diagnosis by a physical exam. X-rays can confirm the type and amount of fracture. Sometimes a fracture that is not displaced cannot be seen on the original X-ray. TREATMENT  Radial head fractures are classified according to the amount of movement (displacement) of parts from the normal position.  Type 1 Fractures  Type 1 fractures are generally small fractures in which bone pieces remain together (nondisplaced fracture).  The fracture may not be seen on initial X-rays. Usually if X-rays are repeated two to three weeks later, the fracture will show up. A splint or sling is used for a few days. Gentle early motion is used to prevent the elbow from becoming stiff. It should not be done vigorously or forced as this could displace the bone pieces. Type 2 Fractures  With type 2 fractures, bone pieces are slightly displaced and larger pieces of bone are broken off.  If only a little displacement of the bone piece is present, splinting for 4 to 5 days usually works well. This is again followed with gentle active range of motion. Small fragments may be surgically removed.  Large pieces of bone that can be put back into place will sometimes be fixed with pins or screws to hold them until the bone is healed. If this cannot be done, the fragments are removed. For older, less active people, sometimes the entire  radial head is removed if the wrist is not injured. The elbow and arm will still work fine. Soft tissue, tendon, and ligament injuries are corrected at the same time. Type 3 Fractures  Type 3 fractures have multiple broken pieces of bone that cannot be fixed. Surgery is usually needed to remove the broken bits of bone and what is left of the radial head. Soft-tissue damage is repaired. Gentle early motion is used to prevent the elbow from becoming stiff. Sometimes an artificial radial head can be used to prevent deformity if the elbow is unstable. Rest, ice, elevation, immobilization, medications, and pain control are used in the early care. HOME CARE INSTRUCTIONS   Keep the injured part elevated while sitting or lying down. Keep the injury above the level of your heart (the center of the chest). This will decrease swelling and pain.  Apply ice to the injury for 15-20 minutes, 03-04 times per day while awake, for 2 days. Put the ice in a plastic bag and place a towel between the bag of ice and your cast or splint.  Move your fingers to avoid stiffness and minimize swelling.  If you have a plaster or fiberglass cast:  Do not try to scratch the skin under the cast using sharp or pointed objects.  Check the skin around the cast every day. You may put lotion on any red or sore areas.  Keep your cast dry and clean.  If you have a plaster splint:  Wear the splint as directed.  You may loosen the elastic around the splint if your fingers become numb, tingle, or turn cold or blue.  Do not put pressure on any part of your cast or splint. It may break. Rest your cast only on a pillow for the first 24 hours until it is fully hardened.  Your cast or splint can be protected during bathing with a plastic bag. Do not lower the cast or splint into the water.  Only take over-the-counter or prescription medicines for pain, discomfort, or fever as directed by your caregiver.  Follow all instructions  for follow-up with your caregiver. This includes any orthopedic referrals, physical therapy, and rehabilitation. Any delay in obtaining necessary care could result in a delay or failure of the bones to heal or permanent elbow stiffness.  Do not overdo exercises. This could further damage your injury. SEEK IMMEDIATE MEDICAL CARE IF:   Your cast or splint gets damaged or breaks.  You have more severe pain or swelling than you did before getting the cast.  You have severe pain when stretching your fingers.  There is a bad smell, new stains, and/or pus-like (purulent) drainage coming from under the cast.  Your fingers or hand turn pale or blue, become cold, or you lose feeling.   This information is not intended to replace advice given to you by your health care provider. Make sure you discuss any questions you have with your health care provider.   Document Released: 05/01/2006 Document Revised: 07/31/2014 Document Reviewed: 01/20/2015 Elsevier Interactive Patient Education 2016 [employer]  Cast or Splint Care Casts and splints support injured limbs and keep bones from moving while they heal. It is important to care for your cast or splint at home.  HOME CARE INSTRUCTIONS  Keep the cast or splint uncovered during the drying period. It can take 24 to 48 hours to dry if it is made of plaster. A fiberglass cast will dry in less than 1 hour.  Do not rest the cast on anything harder than a pillow for the first 24 hours.  Do not put weight on your injured limb or apply pressure to the cast until your health care provider gives you permission.  Keep the cast or splint dry. Wet casts or splints can lose their shape and may not support the limb as well. A wet cast that has lost its shape can also create harmful pressure on your skin when it dries. Also, wet skin can become infected.  Cover the cast or splint with a plastic bag when bathing or when out in the rain or snow. If the cast is on  the trunk of the body, take sponge baths until the cast is removed.  If your cast does become wet, dry it with a towel or a blow dryer on the cool setting only.  Keep your cast or splint clean. Soiled casts may be wiped with a moistened cloth.  Do not place any hard or soft foreign objects under your cast or splint, such as cotton, toilet paper, lotion, or powder.  Do not try to scratch the skin under the cast with any object. The object could get stuck inside the cast. Also, scratching could lead to an infection. If itching is a problem, use a blow dryer on a cool setting to relieve discomfort.  Do not trim or cut your cast or remove padding from inside of it.  Exercise all joints next to the injury that are not  immobilized by the cast or splint. For example, if you have a long leg cast, exercise the hip joint and toes. If you have an arm cast or splint, exercise the shoulder, elbow, thumb, and fingers.  Elevate your injured arm or leg on 1 or 2 pillows for the first 1 to 3 days to decrease swelling and pain.It is best if you can comfortably elevate your cast so it is higher than your heart. SEEK MEDICAL CARE IF:   Your cast or splint cracks.  Your cast or splint is too tight or too loose.  You have unbearable itching inside the cast.  Your cast becomes wet or develops a soft spot or area.  You have a bad smell coming from inside your cast.  You get an object stuck under your cast.  Your skin around the cast becomes red or raw.  You have new pain or worsening pain after the cast has been applied. SEEK IMMEDIATE MEDICAL CARE IF:   You have fluid leaking through the cast.  You are unable to move your fingers or toes.  You have discolored (blue or white), cool, painful, or very swollen fingers or toes beyond the cast.  You have tingling or numbness around the injured area.  You have severe pain or pressure under the cast.  You have any difficulty with your breathing or have  shortness of breath.  You have chest pain.   This information is not intended to replace advice given to you by your health care provider. Make sure you discuss any questions you have with your health care provider.   Document Released: 07/07/2000 Document Revised: 04/30/2013 Document Reviewed: 01/16/2013 Elsevier Interactive Patient Education 2016 [employer]  Delayed Wound Closure Sometimes, your health care provider will decide to delay closing a wound for several days. This is done when the wound is badly bruised, dirty, or when it has been several hours since the injury happened. By delaying the closure of your wound, the risk of infection is reduced. Wounds that are closed in 3-7 days after being cleaned up and dressed heal just as well as those that are closed right away. HOME CARE INSTRUCTIONS  Rest and elevate the injured area until the pain and swelling are gone.  Have your wound checked as instructed by your health care provider. SEEK MEDICAL CARE IF:  You develop unusual or increased swelling or redness around the wound.  You have increasing pain or tenderness.  There is increasing fluid (drainage) or a bad smelling drainage coming from the wound.   This information is not intended to replace advice given to you by your health care provider. Make sure you discuss any questions you have with your health care provider.   Document Released: 07/10/2005 Document Revised: 07/15/2013 Document Reviewed: 01/07/2013 Elsevier Interactive Patient Education [employer]

## 2015-12-15 NOTE — ED Notes (Signed)
Patient transported to X-ray 

## 2015-12-15 NOTE — ED Notes (Signed)
PT STATES THAT TYLENOL WILL NOT HELP HER PAIN, SHE WOULD LIKE A NARCOTIC. PA WILLIAM MADE AWARE.

## 2016-01-27 ENCOUNTER — Encounter (HOSPITAL_COMMUNITY): Payer: Self-pay | Admitting: Emergency Medicine

## 2016-01-27 ENCOUNTER — Ambulatory Visit (INDEPENDENT_AMBULATORY_CARE_PROVIDER_SITE_OTHER): Payer: Medicaid Other

## 2016-01-27 ENCOUNTER — Ambulatory Visit (HOSPITAL_COMMUNITY)
Admission: EM | Admit: 2016-01-27 | Discharge: 2016-01-27 | Disposition: A | Discharge status: Home / Self Care | Payer: Medicaid Other | Attending: Emergency Medicine | Admitting: Emergency Medicine

## 2016-01-27 DIAGNOSIS — L089 Local infection of the skin and subcutaneous tissue, unspecified: Secondary | ICD-10-CM

## 2016-01-27 DIAGNOSIS — S298XXA Other specified injuries of thorax, initial encounter: Secondary | ICD-10-CM

## 2016-01-27 DIAGNOSIS — S40211A Abrasion of right shoulder, initial encounter: Secondary | ICD-10-CM

## 2016-01-27 MED ORDER — KETOROLAC TROMETHAMINE 30 MG/ML IJ SOLN
30.0000 mg | Freq: Once | INTRAMUSCULAR | Status: AC
  Administered 2016-01-27: 30 mg via INTRAMUSCULAR

## 2016-01-27 MED ORDER — HYDROCODONE-ACETAMINOPHEN 5-325 MG PO TABS: 1.0000 | ORAL_TABLET | Freq: Four times a day (QID) | ORAL | Status: DC | PRN

## 2016-01-27 MED ORDER — CYCLOBENZAPRINE HCL 10 MG PO TABS: 10.0000 mg | ORAL_TABLET | Freq: Two times a day (BID) | ORAL | Status: DC | PRN

## 2016-01-27 MED ORDER — KETOROLAC TROMETHAMINE 30 MG/ML IJ SOLN
30.0000 mg | Freq: Once | INTRAMUSCULAR | Status: DC
Start: 2016-01-27 — End: 2016-01-27

## 2016-01-27 MED ORDER — KETOROLAC TROMETHAMINE 30 MG/ML IJ SOLN
INTRAMUSCULAR | Status: AC
  Filled 2016-01-27: qty 1
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
## 2016-01-29 ENCOUNTER — Emergency Department (HOSPITAL_COMMUNITY): Payer: Medicaid Other

## 2016-01-29 ENCOUNTER — Encounter (HOSPITAL_COMMUNITY): Payer: Self-pay | Admitting: Oncology

## 2016-01-29 ENCOUNTER — Emergency Department (HOSPITAL_COMMUNITY)
Admission: EM | Admit: 2016-01-29 | Discharge: 2016-01-30 | Disposition: A | Discharge status: Home / Self Care | Payer: Medicaid Other | Attending: Emergency Medicine | Admitting: Emergency Medicine

## 2016-01-29 DIAGNOSIS — Z87891 Personal history of nicotine dependence: Secondary | ICD-10-CM | POA: Diagnosis not present

## 2016-01-29 DIAGNOSIS — R0789 Other chest pain: Secondary | ICD-10-CM | POA: Insufficient documentation

## 2016-01-29 DIAGNOSIS — R0781 Pleurodynia: Secondary | ICD-10-CM | POA: Diagnosis present

## 2016-01-29 MED ORDER — KETOROLAC TROMETHAMINE 60 MG/2ML IM SOLN
60.0000 mg | Freq: Once | INTRAMUSCULAR | Status: AC
  Administered 2016-01-29: 60 mg via INTRAMUSCULAR
  Filled 2016-01-29: qty 2

## 2016-01-29 MED ORDER — HYDROCODONE-ACETAMINOPHEN 5-325 MG PO TABS
2.0000 | ORAL_TABLET | Freq: Once | ORAL | Status: AC
  Administered 2016-01-29: 2 via ORAL
  Filled 2016-01-29: qty 2
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
## 2016-01-30 ENCOUNTER — Emergency Department (HOSPITAL_COMMUNITY): Payer: Medicaid Other

## 2017-01-05 ENCOUNTER — Emergency Department (HOSPITAL_COMMUNITY): Payer: Medicaid Other

## 2017-01-05 ENCOUNTER — Encounter (HOSPITAL_COMMUNITY): Payer: Self-pay | Admitting: Emergency Medicine

## 2017-01-05 ENCOUNTER — Emergency Department (HOSPITAL_COMMUNITY)
Admission: EM | Admit: 2017-01-05 | Discharge: 2017-01-05 | Disposition: A | Discharge status: Home / Self Care | Payer: Medicaid Other | Attending: Emergency Medicine | Admitting: Emergency Medicine

## 2017-01-05 DIAGNOSIS — Y929 Unspecified place or not applicable: Secondary | ICD-10-CM | POA: Insufficient documentation

## 2017-01-05 DIAGNOSIS — Z87891 Personal history of nicotine dependence: Secondary | ICD-10-CM | POA: Diagnosis not present

## 2017-01-05 DIAGNOSIS — G44319 Acute post-traumatic headache, not intractable: Secondary | ICD-10-CM

## 2017-01-05 DIAGNOSIS — S060X1A Concussion with loss of consciousness of 30 minutes or less, initial encounter: Secondary | ICD-10-CM | POA: Insufficient documentation

## 2017-01-05 DIAGNOSIS — S0181XA Laceration without foreign body of other part of head, initial encounter: Secondary | ICD-10-CM | POA: Diagnosis not present

## 2017-01-05 DIAGNOSIS — S161XXA Strain of muscle, fascia and tendon at neck level, initial encounter: Secondary | ICD-10-CM | POA: Diagnosis not present

## 2017-01-05 DIAGNOSIS — Y999 Unspecified external cause status: Secondary | ICD-10-CM | POA: Insufficient documentation

## 2017-01-05 DIAGNOSIS — R51 Headache: Secondary | ICD-10-CM | POA: Diagnosis present

## 2017-01-05 DIAGNOSIS — Y939 Activity, unspecified: Secondary | ICD-10-CM | POA: Insufficient documentation

## 2017-01-05 LAB — CBC WITH DIFFERENTIAL/PLATELET
Basophils Absolute: 0 10*3/uL (ref 0.0–0.1)
Basophils Relative: 0 %
Eosinophils Absolute: 0.2 10*3/uL (ref 0.0–0.7)
Eosinophils Relative: 2 %
HEMATOCRIT: 38.4 % (ref 36.0–46.0)
HEMOGLOBIN: 13 g/dL (ref 12.0–15.0)
LYMPHS ABS: 2.1 10*3/uL (ref 0.7–4.0)
LYMPHS PCT: 23 %
MCH: 31.2 pg (ref 26.0–34.0)
MCHC: 33.9 g/dL (ref 30.0–36.0)
MCV: 92.1 fL (ref 78.0–100.0)
MONOS PCT: 5 %
Monocytes Absolute: 0.4 10*3/uL (ref 0.1–1.0)
Neutro Abs: 6.5 10*3/uL (ref 1.7–7.7)
Neutrophils Relative %: 70 %
Platelets: 213 10*3/uL (ref 150–400)
RBC: 4.17 MIL/uL (ref 3.87–5.11)
RDW: 13.2 % (ref 11.5–15.5)
WBC: 9.2 10*3/uL (ref 4.0–10.5)

## 2017-01-05 LAB — I-STAT CHEM 8, ED
BUN: 7 mg/dL (ref 6–20)
CHLORIDE: 105 mmol/L (ref 101–111)
Calcium, Ion: 1.02 mmol/L — ABNORMAL LOW (ref 1.15–1.40)
Creatinine, Ser: 0.9 mg/dL (ref 0.44–1.00)
Glucose, Bld: 89 mg/dL (ref 65–99)
HCT: 38 % (ref 36.0–46.0)
Hemoglobin: 12.9 g/dL (ref 12.0–15.0)
POTASSIUM: 5.5 mmol/L — AB (ref 3.5–5.1)
SODIUM: 136 mmol/L (ref 135–145)
TCO2: 27 mmol/L (ref 0–100)

## 2017-01-05 LAB — I-STAT BETA HCG BLOOD, ED (MC, WL, AP ONLY): I-stat hCG, quantitative: <5 m[IU]/mL (ref <5)

## 2017-01-05 MED ORDER — ACETAMINOPHEN 500 MG PO TABS
1000.0000 mg | ORAL_TABLET | Freq: Once | ORAL | Status: AC
  Administered 2017-01-05: 1000 mg via ORAL
  Filled 2017-01-05: qty 2

## 2017-01-05 MED ORDER — SODIUM CHLORIDE 0.9 % IV BOLUS (SEPSIS)
1000.0000 mL | Freq: Once | INTRAVENOUS | Status: AC
  Administered 2017-01-05: 1000 mL via INTRAVENOUS

## 2017-01-05 MED ORDER — LIDOCAINE-EPINEPHRINE 1 %-1:100000 IJ SOLN: 20.0000 mL | Freq: Once | INTRAMUSCULAR | Status: DC

## 2017-01-05 MED ORDER — IOPAMIDOL (ISOVUE-370) INJECTION 76%
INTRAVENOUS | Status: AC
  Administered 2017-01-05: 50 mL
  Filled 2017-01-05: qty 50

## 2017-01-05 MED ORDER — ONDANSETRON HCL 4 MG/2ML IJ SOLN
4.0000 mg | Freq: Once | INTRAMUSCULAR | Status: AC
  Administered 2017-01-05: 4 mg via INTRAVENOUS
  Filled 2017-01-05: qty 2

## 2017-01-05 MED ORDER — TETANUS-DIPHTH-ACELL PERTUSSIS 5-2.5-18.5 LF-MCG/0.5 IM SUSP: 0.5000 mL | Freq: Once | INTRAMUSCULAR | Status: DC

## 2017-01-05 MED ORDER — MORPHINE SULFATE (PF) 4 MG/ML IV SOLN
4.0000 mg | Freq: Once | INTRAVENOUS | Status: AC
  Administered 2017-01-05: 4 mg via INTRAVENOUS
  Filled 2017-01-05: qty 1

## 2017-01-05 MED ORDER — IBUPROFEN 800 MG PO TABS
800.0000 mg | ORAL_TABLET | Freq: Once | ORAL | Status: AC
  Administered 2017-01-05: 800 mg via ORAL
  Filled 2017-01-05: qty 1

## 2017-01-05 MED ORDER — OXYCODONE HCL 5 MG PO TABS
5.0000 mg | ORAL_TABLET | Freq: Once | ORAL | Status: AC
  Administered 2017-01-05: 5 mg via ORAL
  Filled 2017-01-05: qty 1

## 2017-01-05 MED ORDER — LIDOCAINE-EPINEPHRINE 1 %-1:100000 IJ SOLN
20.0000 mL | Freq: Once | INTRAMUSCULAR | Status: AC
  Administered 2017-01-05: 20 mL via INTRADERMAL
  Filled 2017-01-05: qty 20
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
## 2017-01-05 NOTE — ED Notes (Signed)
Patient transported to CT 

## 2017-07-24 NOTE — L&D Delivery Note (Signed)
Patient is 27 y.o. [id number] admitted in labor, hx of history of limited prenatal care x2 visits.   Delivery Note At 12:23 PM a viable female was delivered via Vaginal, Spontaneous (Presentation: LOA ;  ).  SROM, with meconium stained fluid immediately prior to delivery. APGAR: 9, 9; weight:pending. nucal cord x1 reduced prior to delivery.    Placenta status: Spnotaneous, intact . Cord: 3 vessel  Anesthesia:  Epidural Episiotomy: None Lacerations: None Est. Blood Loss (mL): 200  Mom to postpartum.  Baby to Couplet care / Skin to Skin.  Upon arrival patient was 8cm and feeling pressure. She pushed with good maternal effort to deliver a healthy baby girl. Baby delivered without difficulty, was noted to have good tone and place on maternal abdomen for oral suctioning, drying and stimulation. Delayed cord clamping performed. Placenta delivered intact with 3V cord. Vaginal canal and perineum was inspected and without laceration; hemostatic. Pitocin was started and uterus massaged until bleeding slowed. Counts of sharps, instruments, and lap pads were all correct.   Mirian MoPeter Rickia [place], MD PGY-1 8/16/201912:56 PM

## 2017-07-27 ENCOUNTER — Encounter (HOSPITAL_COMMUNITY): Payer: Self-pay

## 2017-07-27 ENCOUNTER — Other Ambulatory Visit: Payer: Self-pay

## 2017-07-27 ENCOUNTER — Emergency Department (HOSPITAL_COMMUNITY)
Admission: EM | Admit: 2017-07-27 | Discharge: 2017-07-27 | Disposition: A | Discharge status: Home / Self Care | Payer: Medicaid Other | Attending: Emergency Medicine | Admitting: Emergency Medicine

## 2017-07-27 DIAGNOSIS — O200 Threatened abortion: Secondary | ICD-10-CM | POA: Insufficient documentation

## 2017-07-27 DIAGNOSIS — Z3A08 8 weeks gestation of pregnancy: Secondary | ICD-10-CM | POA: Diagnosis not present

## 2017-07-27 DIAGNOSIS — O209 Hemorrhage in early pregnancy, unspecified: Secondary | ICD-10-CM | POA: Diagnosis present

## 2017-07-27 DIAGNOSIS — Z87891 Personal history of nicotine dependence: Secondary | ICD-10-CM | POA: Insufficient documentation

## 2017-07-27 LAB — WET PREP, GENITAL
CLUE CELLS WET PREP: NONE SEEN
SPERM: NONE SEEN
Yeast Wet Prep HPF POC: NONE SEEN

## 2017-07-27 LAB — I-STAT BETA HCG BLOOD, ED (MC, WL, AP ONLY)

## 2017-07-27 NOTE — ED Triage Notes (Signed)
Patient states she is 2 months pregnant.and has been having bright red vaginal bleeding. Patient states she had a small clot as well, Patient denies any abdominal pain.

## 2017-07-27 NOTE — ED Provider Notes (Signed)
[facility]-EMERGENCY DEPT Provider Note   CSN: [id number] Arrival date & time: 07/27/17  0757     History   Chief Complaint Chief Complaint  Patient presents with  . 2 months pregnant  . Vaginal Discharge    HPI Theresa Finley is a 27 y.o. female.  Patient is a healthy 27 year old female presenting today with vaginal bleeding in the setting of known pregnancy.  Patient's last menstrual cycle she thinks was in October.  [place] and was not having normal periods.  Patient works at [employer] and today while at work she felt a gush and had heavy vaginal bleeding.  She denies any significant cramping and states this does not feel like her last miscarriage.  The bleeding more recently has started to taper off.  She otherwise has no complaints.  She is sexually active with only one partner and her last sexual activity was 3 weeks ago.   The history is provided by the patient.  Vaginal Discharge      Past Medical History:  Diagnosis Date  . Medical history non-contributory     Patient Active Problem List   Diagnosis Date Noted  . Active labor at term 05/25/2014  . NSVD (normal spontaneous vaginal delivery) 05/25/2014    Past Surgical History:  Procedure Laterality Date  . NO PAST SURGERIES    . ORIF RADIAL FRACTURE  07/27/2012   Procedure: OPEN REDUCTION INTERNAL FIXATION (ORIF) RADIAL FRACTURE;  Surgeon: Dominica SeverinWilliam Gramig, MD;  Location: [facility] OR;  Service: Orthopedics;  Laterality: Right;  ORIF Right Radial Head Fracture    OB History    Gravida Para Term Preterm AB Living   3 2 2  0 0 2   SAB TAB Ectopic Multiple Live Births   0 0 0 0 1       Home Medications    Prior to Admission medications   Medication Sig Start Date End Date Taking? Authorizing Provider  bacitracin ointment Apply 1 application topically 2 (two) times daily. Patient not taking: Reported on 01/05/2017 12/15/15   Everlene Farrieransie, William, PA-C    cyclobenzaprine (FLEXERIL) 10 MG tablet Take 1 tablet (10 mg total) by mouth 2 (two) times daily as needed for muscle spasms. Patient not taking: Reported on 01/05/2017 01/27/16   Schorr, Roma KayserKatherine P, NP  HYDROcodone-acetaminophen (NORCO/VICODIN) 5-325 MG tablet Take 1-2 tablets by mouth every 6 (six) hours as needed for moderate pain or severe pain. Patient not taking: Reported on 01/05/2017 01/27/16   Schorr, Roma KayserKatherine P, NP  naproxen (NAPROSYN) 250 MG tablet Take 1 tablet (250 mg total) by mouth 2 (two) times daily with a meal. Patient not taking: Reported on 01/05/2017 12/15/15   Everlene Farrieransie, William, PA-C    Family History Family History  Problem Relation Age of Onset  . Cancer Mother     Social History Social History   Tobacco Use  . Smoking status: Former Games developermoker  . Smokeless tobacco: Never Used  . Tobacco comment: Patient reports that she rarly smokes  Substance Use Topics  . Alcohol use: No  . Drug use: No     Allergies   Patient has no known allergies.   Review of Systems Review of Systems  Genitourinary: Positive for vaginal discharge.  All other systems reviewed and are negative.    Physical Exam Updated Vital Signs BP 110/70   Pulse 69   Temp 97.9 F (36.6 C)   Resp 14   Ht 5\' 4"  (1.626 m)  Wt 54.4 kg (120 lb)   LMP 12/22/2016   SpO2 100%   BMI 20.60 kg/m   Physical Exam  Constitutional: She is oriented to person, place, and time. She appears well-developed and well-nourished. No distress.  HENT:  Head: Normocephalic and atraumatic.  Mouth/Throat: Oropharynx is clear and moist.  Eyes: Conjunctivae and EOM are normal. Pupils are equal, round, and reactive to light.  Neck: Normal range of motion. Neck supple.  Cardiovascular: Normal rate.  Pulmonary/Chest: Effort normal.  Abdominal: Soft. She exhibits no distension. There is no tenderness. There is no rebound and no guarding.  Genitourinary: Uterus is enlarged. Cervix exhibits no discharge and no  friability. Right adnexum displays no tenderness. Left adnexum displays no tenderness. There is bleeding in the vagina.  Musculoskeletal: Normal range of motion. She exhibits no edema or tenderness.  Neurological: She is alert and oriented to person, place, and time.  Skin: Skin is warm and dry. No rash noted. No erythema.  Psychiatric: She has a normal mood and affect. Her behavior is normal.  Nursing note and vitals reviewed.    ED Treatments / Results  Labs (all labs ordered are listed, but only abnormal results are displayed) Labs Reviewed  I-STAT BETA HCG BLOOD, ED ([facility], [facility], [facility] ONLY) - Abnormal; Notable for the following components:      Result Value   I-stat hCG, quantitative >2,000.0 (*)    All other components within normal limits  WET PREP, GENITAL  GC/CHLAMYDIA PROBE AMP ([place]) NOT AT [facility]    EKG  EKG Interpretation None       Radiology No results found.  EMERGENCY DEPARTMENT [place] PREGNANCY "Study: Limited Ultrasound of the Pelvis for Pregnancy"  INDICATIONS:Pregnancy(required) and Vaginal bleeding Multiple views of the uterus and pelvic cavity were obtained in real-time with a multi-frequency probe.  APPROACH:Transabdominal  PERFORMED BY: Myself IMAGES ARCHIVED?: Yes LIMITATIONS: none PREGNANCY FREE FLUID: None ADNEXAL FINDINGS:Left ovary not seen and Right ovary not seen GESTATIONAL AGE, ESTIMATE: [redacted]w[redacted]d FETAL HEART RATE: 158 INTERPRETATION: Intrauterine gestational sac noted, Fetal pole present and Fetal heart activity seen     Procedures Procedures (including critical care time)  Medications Ordered in ED Medications - No data to display   Initial Impression / Assessment and Plan / ED Course  I have reviewed the triage vital signs and the nursing notes.  Pertinent labs & imaging results that were available during my care of the patient were reviewed by me and considered in my medical decision making (see chart for details).     Patient  presenting with first trimester vaginal bleeding.  She is having no cramping or pain at this time.  Fresh blood in the vaginal vault but no excessive bleeding.  HCG is greater than 2000.  Pt's blood type is A+.  Bedside ultrasound shows IUP with heart rate present.  GC chlamydia and wet prep sent however patient is not have any complaints concerning for STI.  She will follow-up with OB/GYN.  Final Clinical Impressions(s) / ED Diagnoses   Final diagnoses:  Threatened miscarriage in early pregnancy    ED Discharge Orders    None       Gwyneth Sprout, MD 07/27/17 1057

## 2017-07-27 NOTE — ED Notes (Signed)
First attempt for triage. PT did not answer

## 2017-07-30 LAB — GC/CHLAMYDIA PROBE AMP (~~LOC~~) NOT AT ARMC
Chlamydia: NEGATIVE
Neisseria Gonorrhea: NEGATIVE

## 2017-10-22 ENCOUNTER — Ambulatory Visit (INDEPENDENT_AMBULATORY_CARE_PROVIDER_SITE_OTHER): Payer: Medicaid Other | Admitting: Advanced Practice Midwife

## 2017-10-22 ENCOUNTER — Other Ambulatory Visit (HOSPITAL_COMMUNITY)
Admission: RE | Admit: 2017-10-22 | Discharge: 2017-10-22 | Disposition: A | Discharge status: Home / Self Care | Payer: Medicaid Other | Source: Ambulatory Visit | Attending: Advanced Practice Midwife | Admitting: Advanced Practice Midwife

## 2017-10-22 ENCOUNTER — Encounter: Payer: Self-pay | Admitting: Advanced Practice Midwife

## 2017-10-22 DIAGNOSIS — Z3491 Encounter for supervision of normal pregnancy, unspecified, first trimester: Secondary | ICD-10-CM

## 2017-10-22 DIAGNOSIS — Z3490 Encounter for supervision of normal pregnancy, unspecified, unspecified trimester: Secondary | ICD-10-CM | POA: Insufficient documentation

## 2017-10-22 LAB — POCT URINALYSIS DIP (DEVICE)
Bilirubin Urine: NEGATIVE
Glucose, UA: NEGATIVE mg/dL
HGB URINE DIPSTICK: NEGATIVE
KETONES UR: NEGATIVE mg/dL
Nitrite: NEGATIVE
Protein, ur: NEGATIVE mg/dL
SPECIFIC GRAVITY, URINE: 1.015 (ref 1.005–1.030)
UROBILINOGEN UA: 0.2 mg/dL (ref 0.0–1.0)
pH: 7 (ref 5.0–8.0)

## 2017-10-22 MED ORDER — ELASTIC BANDAGES & SUPPORTS MISC: 1.0000 [IU] | 0 refills | Status: DC | PRN

## 2017-10-22 MED ORDER — PREPLUS 27-1 MG PO TABS
1.0000 | ORAL_TABLET | Freq: Every day | ORAL | 9 refills | Status: DC
Start: 2017-10-22 — End: 2018-01-03

## 2017-10-22 MED ORDER — ONDANSETRON 4 MG PO TBDP: 4.0000 mg | ORAL_TABLET | Freq: Three times a day (TID) | ORAL | 3 refills | Status: DC | PRN

## 2017-10-22 NOTE — Progress Notes (Signed)
  Subjective:    Theresa Finley is being seen today for her first obstetrical visit.  This is not a planned pregnancy. She is at Unknown gestation. Her obstetrical history is significant for late to prenatal care. . Relationship with FOB: significant other, living together. Patient does not intend to breast feed. Pregnancy history fully reviewed.  Patient reports nausea/vomiting, body aches all the time, tired, "nothing feels like my other pregnancies". .  Review of Systems:   Review of Systems  All other systems reviewed and are negative.   Objective:     LMP 04/23/2017 (Approximate)  Physical Exam  Nursing note and vitals reviewed. Constitutional: She is oriented to person, place, and time. She appears well-developed and well-nourished. No distress.  HENT:  Head: Normocephalic.  Cardiovascular: Normal rate.  Respiratory: Effort normal.  GI: Soft. There is no tenderness. There is no rebound.  Genitourinary:  Genitourinary Comments:  External: no lesion Vagina: small amount of white discharge Cervix: pink, smooth, no CMT Uterus: AGA   Neurological: She is alert and oriented to person, place, and time.  Skin: Skin is warm and dry.  Psychiatric: She has a normal mood and affect.    Maternal Exam:  Abdomen: Fundal height is 21.    Introitus: Normal vulva. Normal vagina.  Cervix: Cervix evaluated by sterile speculum exam and digital exam.     Fetal Exam Fetal Monitor Review: Mode: hand-held doppler probe.   Baseline rate: 162.         Assessment:    Pregnancy: [id number] Patient Active Problem List   Diagnosis Date Noted  . Supervision of low-risk pregnancy 10/22/2017  . Active labor at term 05/25/2014  . NSVD (normal spontaneous vaginal delivery) 05/25/2014       Plan:     Initial labs drawn. Pap done  RX: zofran PRN, maternity support belt  Prenatal vitamins. Problem list reviewed and updated. AFP3 discussed: declined. Panorama: declined  Role of  ultrasound in pregnancy discussed; fetal survey: requested. Amniocentesis discussed: not indicated. Follow up in 4 weeks. 50% of 45 min visit spent on counseling and coordination of care.     Thressa ShellerHeather Maleeya Peterkin 10/22/2017

## 2017-10-22 NOTE — Progress Notes (Signed)
Here for new ob. Had bleeding in January and went to Ed. States had spotting for about a month after that. States this pregnancy doesn't feel normal- feels like baby about to fall out. Feels like it balls up. Declines flu shot. [facility].  Already has MyChart. Signed up for Babycripts app only.

## 2017-10-22 NOTE — Progress Notes (Signed)
Addendum: elevated phq9- Patient and/or legal guardian verbally declined to meet with Behavioral Health Clinician about elevated phq9.  Completed PMH.

## 2017-10-22 NOTE — Patient Instructions (Signed)
Childbirth Education Options: [place] Classes:  Childbirth education classes can help you get ready for a positive parenting experience. You can also meet other expectant parents and get free stuff for your baby. Each class runs for five weeks on the same night and costs $45 for the mother-to-be and her support person. Medicaid covers the cost if you are eligible. Call [phone] to register. [facility] Childbirth Education:  [phone] or [phone] or [email or web address]  Baby & Me Class: Discuss newborn & infant parenting and family adjustment issues with other new mothers in a relaxed environment. Each week brings a new speaker or baby-centered activity. We encourage new mothers to join us every Thursday at 11:00am. Babies birth until crawling. No registration or fee. Daddy Boot Camp: This course offers Dads-to-be the tools and knowledge needed to feel confident on their journey to becoming new fathers. Experienced dads, who have been trained as coaches, teach dads-to-be how to hold, comfort, diaper, swaddle and play with their infant while being [place] to support the new mom as well. A class for men taught by men. $25/dad Big Brother/Big Sister: Let your children share in the joy of a new brother or sister in this special class designed just for them. Class includes discussion about how families care for babies: swaddling, holding, diapering, safety as well as how they can be helpful in their new role. This class is designed for children ages 2 to 6, but any age is welcome. Please register each child individually. $5/child  Mom Talk: This mom-led group offers support and connection to mothers as they journey through the adjustments and struggles of that sometimes overwhelming first year after the birth of a child. Tuesdays at 10:00am and Thursdays at 6:00pm. Babies welcome. No registration or fee. Breastfeeding Support Group: This group is a mother-to-mother  support circle where moms have the opportunity to share their breastfeeding experiences. A Lactation Consultant is present for questions and concerns. Meets each Tuesday at 11:00am. No fee or registration. Breastfeeding Your Baby: Learn what to expect in the first days of breastfeeding your newborn.  This class will help you feel more confident with the skills needed to begin your breastfeeding experience. Many new mothers are concerned about breastfeeding after leaving the hospital. This class will also address the most common fears and challenges about breastfeeding during the first few weeks, months and beyond. (call for fee) Comfort Techniques and Tour: This 2 hour interactive class will provide you the opportunity to learn & practice hands-on techniques that can help relieve some of the discomfort of labor and encourage your baby to rotate toward the best position for birth. You and your partner will be [place] to try a variety of labor positions with birth balls and rebozos as well as practice breathing, relaxation, and visualization techniques. A tour of the [facility] is included with this class. $20 per registrant and support person Childbirth Class- Weekend Option: This class is a Weekend version of our Birth & Baby series. It is designed for parents who have a difficult time fitting several weeks of classes into their schedule. It covers the care of your newborn and the basics of labor and childbirth. It also includes a Maternity Care Center Tour of [facility] and lunch. The class is held two consecutive days: beginning on Friday evening from 6:30 - 8:30 p.m. and the next day, Saturday from 9 a.m. - 4 p.m. (call for fee) Waterbirth Class: Interested in a waterbirth?  This   informational class will help you discover whether waterbirth is the right fit for you. Education about waterbirth itself, supplies you would need and how to assemble your support team is what you can  expect from this class. Some obstetrical practices require this class in order to pursue a waterbirth. (Not all obstetrical practices offer waterbirth-check with your healthcare provider.) Register only the expectant mom, but you are encouraged to bring your partner to class! Required if planning waterbirth, no fee. Infant/Child CPR: Parents, grandparents, babysitters, and friends learn Cardio-Pulmonary Resuscitation skills for infants and children. You will also learn how to treat both conscious and unconscious choking in infants and children. This Family & Friends program does not offer certification. Register each participant individually to ensure that enough mannequins are available. (Call for fee) Grandparent Love: Expecting a grandbaby? This class is for you! Learn about the latest infant care and safety recommendations and ways to support your own child as he or she transitions into the parenting role. Taught by Registered Nurses who are childbirth instructors, but most importantly...they are grandmothers too! $10/person. Childbirth Class- Natural Childbirth: This series of 5 weekly classes is for expectant parents who want to learn and practice natural methods of coping with the process of labor and childbirth. Relaxation, breathing, massage, visualization, role of the partner, and helpful positioning are highlighted. Participants learn how to be confident in their body's ability to give birth. This class will empower and help parents make informed decisions about their own care. Includes discussion that will help new parents transition into the immediate postpartum period. Maternity Care Center Tour of [facility] is included. We suggest taking this class between 25-32 weeks, but it's only a recommendation. $75 per registrant and one support person or $30 Medicaid. Childbirth Class- 3 week Series: This option of 3 weekly classes helps you and your labor partner prepare for childbirth. Newborn  care, labor & birth, cesarean birth, pain management, and comfort techniques are discussed and a Maternity Care Center Tour of [facility] is included. The class meets at the same time, on the same day of the week for 3 consecutive weeks beginning with the starting date you choose. $60 for registrant and one support person.  Marvelous Multiples: Expecting twins, triplets, or more? This class covers the differences in labor, birth, parenting, and breastfeeding issues that face multiples' parents. NICU tour is included. Led by a Certified Childbirth Educator who is the mother of twins. No fee. Caring for Baby: This class is for expectant and adoptive parents who want to learn and practice the most up-to-date newborn care for their babies. Focus is on birth through the first six weeks of life. Topics include feeding, bathing, diapering, crying, umbilical cord care, circumcision care and safe sleep. Parents learn to recognize symptoms of illness and when to call the pediatrician. Register only the mom-to-be and your partner or support person can plan to come with you! $10 per registrant and support person Childbirth Class- online option: This online class offers you the freedom to complete a Birth and Baby series in the comfort of your own home. The flexibility of this option allows you to review sections at your own pace, at times convenient to you and your support people. It includes additional video information, animations, quizzes, and extended activities. Get organized with helpful eClass tools, checklists, and trackers. Once you register online for the class, you will receive an email within a few days to accept the invitation and begin the class when the time   is right for you. The content will be available to you for 60 days. $60 for 60 days of online access for you and your support people.  Local Doulas: Natural Baby Doulas [email or web address] Tel:  [phone] [email or web address] [phone] [email or web address] The Labor Merla RichesLadies  (also do waterbirth tub rental) [phone] [email or web address] Doula [phone] [email or web address] [facility]  [phone] [email or web address] [employer] (PADA) [email or web address] [place] Birth and Baby  [email or web address] Safe Medications in Pregnancy   Acne: Benzoyl Peroxide Salicylic Acid  Backache/Headache: Tylenol: 2 regular strength every 4 hours OR              2 Extra strength every 6 hours  Colds/Coughs/Allergies: Benadryl (alcohol free) 25 mg every 6 hours as needed Breath right strips Claritin Cepacol throat lozenges Chloraseptic throat spray Cold-Eeze- up to three times per day Cough drops, alcohol free Flonase (by prescription only) Guaifenesin Mucinex Robitussin DM (plain only, alcohol free) Saline nasal spray/drops Sudafed (pseudoephedrine) & Actifed ** use only after [redacted] weeks gestation and if you do not have high blood pressure Tylenol Vicks Vaporub Zinc lozenges Zyrtec   Constipation: Colace Ducolax suppositories Fleet enema Glycerin suppositories Metamucil Milk of magnesia Miralax Senokot Smooth move tea  Diarrhea: Kaopectate Imodium A-D  *NO pepto Bismol  Hemorrhoids: Anusol Anusol HC Preparation H Tucks  Indigestion: Tums Maalox Mylanta Zantac  Pepcid  Insomnia: Benadryl (alcohol free) 25mg  every 6 hours as needed Tylenol PM Unisom, no Gelcaps  Leg Cramps: Tums MagGel  Nausea/Vomiting:  Bonine Dramamine Emetrol Ginger extract Sea bands Meclizine  Nausea medication to take during pregnancy:  Unisom (doxylamine succinate 25 mg tablets) Take one tablet daily at bedtime. If symptoms are  not adequately controlled, the dose can be increased to a maximum recommended dose of two tablets daily (1/2 tablet in the morning, 1/2 tablet mid-afternoon and one at bedtime). Vitamin B6 100mg  tablets. Take one tablet twice a day (up to 200 mg per day).  Skin Rashes: Aveeno products Benadryl cream or 25mg  every 6 hours as needed Calamine Lotion 1% cortisone cream  Yeast infection: Gyne-lotrimin 7 Monistat 7   **If taking multiple medications, please check labels to avoid duplicating the same active ingredients **take medication as directed on the label ** Do not exceed 4000 mg of tylenol in 24 hours **Do not take medications that contain aspirin or ibuprofen

## 2017-10-23 LAB — CYTOLOGY - PAP
Bacterial vaginitis: POSITIVE — AB
Candida vaginitis: POSITIVE — AB
Chlamydia: NEGATIVE
Diagnosis: NEGATIVE
NEISSERIA GONORRHEA: NEGATIVE
TRICH (WINDOWPATH): POSITIVE — AB

## 2017-10-24 ENCOUNTER — Encounter: Payer: Self-pay | Admitting: *Deleted

## 2017-10-24 ENCOUNTER — Other Ambulatory Visit: Payer: Self-pay | Admitting: Advanced Practice Midwife

## 2017-10-24 ENCOUNTER — Ambulatory Visit (HOSPITAL_COMMUNITY)
Admission: RE | Admit: 2017-10-24 | Discharge: 2017-10-24 | Disposition: A | Discharge status: Home / Self Care | Payer: Medicaid Other | Source: Ambulatory Visit | Attending: Advanced Practice Midwife | Admitting: Advanced Practice Midwife

## 2017-10-24 DIAGNOSIS — Z3490 Encounter for supervision of normal pregnancy, unspecified, unspecified trimester: Secondary | ICD-10-CM

## 2017-10-24 DIAGNOSIS — Z3A20 20 weeks gestation of pregnancy: Secondary | ICD-10-CM | POA: Diagnosis not present

## 2017-10-24 DIAGNOSIS — Z3689 Encounter for other specified antenatal screening: Secondary | ICD-10-CM | POA: Diagnosis not present

## 2017-10-24 DIAGNOSIS — O30009 Twin pregnancy, unspecified number of placenta and unspecified number of amniotic sacs, unspecified trimester: Secondary | ICD-10-CM

## 2017-10-24 LAB — CULTURE, OB URINE

## 2017-10-24 LAB — URINE CULTURE, OB REFLEX

## 2017-10-25 MED ORDER — METRONIDAZOLE 500 MG PO TABS: 2000.0000 mg | ORAL_TABLET | Freq: Once | ORAL | 0 refills | Status: AC

## 2017-10-25 MED ORDER — TERCONAZOLE 0.4 % VA CREA: 1.0000 | TOPICAL_CREAM | Freq: Every day | VAGINAL | 0 refills | Status: DC

## 2017-10-25 NOTE — Addendum Note (Signed)
Addended by: Thressa ShellerHOGAN, HEATHER D on: 10/25/2017 11:20 AM   Modules accepted: Orders

## 2017-10-29 LAB — OBSTETRIC PANEL, INCLUDING HIV
ANTIBODY SCREEN: NEGATIVE
BASOS: 0 %
Basophils Absolute: 0 10*3/uL (ref 0.0–0.2)
EOS (ABSOLUTE): 0.1 10*3/uL (ref 0.0–0.4)
EOS: 1 %
HEMOGLOBIN: 11.4 g/dL (ref 11.1–15.9)
HIV Screen 4th Generation wRfx: NONREACTIVE
Hematocrit: 34.9 % (ref 34.0–46.6)
Hepatitis B Surface Ag: NEGATIVE
IMMATURE GRANS (ABS): 0 10*3/uL (ref 0.0–0.1)
IMMATURE GRANULOCYTES: 0 %
LYMPHS ABS: 0.9 10*3/uL (ref 0.7–3.1)
LYMPHS: 7 %
MCH: 31.5 pg (ref 26.6–33.0)
MCHC: 32.7 g/dL (ref 31.5–35.7)
MCV: 96 fL (ref 79–97)
MONOS ABS: 0.8 10*3/uL (ref 0.1–0.9)
Monocytes: 7 %
NEUTROS PCT: 85 %
Neutrophils Absolute: 10.3 10*3/uL — ABNORMAL HIGH (ref 1.4–7.0)
Platelets: 178 10*3/uL (ref 150–379)
RBC: 3.62 x10E6/uL — AB (ref 3.77–5.28)
RDW: 14.5 % (ref 12.3–15.4)
RPR Ser Ql: NONREACTIVE
Rh Factor: POSITIVE
Rubella Antibodies, IGG: 2.04 index (ref >0.99)
WBC: 12.2 10*3/uL — AB (ref 3.4–10.8)

## 2017-10-29 LAB — SMN1 COPY NUMBER ANALYSIS (SMA CARRIER SCREENING)

## 2017-10-29 LAB — HEMOGLOBINOPATHY EVALUATION
FERRITIN: 29 ng/mL (ref 15–150)
HGB A: 97.9 % (ref 96.4–98.8)
HGB SOLUBILITY: NEGATIVE
Hgb A2 Quant: 2.1 % (ref 1.8–3.2)
Hgb C: 0 %
Hgb F Quant: 0 % (ref 0.0–2.0)
Hgb S: 0 %
Hgb Variant: 0 %

## 2017-10-29 LAB — CYSTIC FIBROSIS MUTATION 97: Interpretation: NOT DETECTED

## 2017-10-31 ENCOUNTER — Telehealth: Payer: Self-pay | Admitting: General Practice

## 2017-10-31 NOTE — Telephone Encounter (Signed)
Called patient and a woman answered stating you have the wrong number. Called emergency contact number, no answer. Will send mychart message

## 2017-10-31 NOTE — Telephone Encounter (Signed)
Patient called back into office & I informed her of results, medication at pharmacy & importance of partner treatment and abstaining from intercourse. Patient verbalized understanding to all & had no questions

## 2017-10-31 NOTE — Telephone Encounter (Signed)
-----   Message from Armando ReichertHeather D Hogan, CNM sent at 10/25/2017 11:18 AM EDT ----- Patient with trichomonas and yeast. I have sent in a rx, but please call her. Thank you, heather

## 2017-11-06 ENCOUNTER — Other Ambulatory Visit: Payer: Self-pay | Admitting: General Practice

## 2017-11-06 DIAGNOSIS — Z3490 Encounter for supervision of normal pregnancy, unspecified, unspecified trimester: Secondary | ICD-10-CM

## 2017-11-06 MED ORDER — METRONIDAZOLE 500 MG PO TABS: 2000.0000 mg | ORAL_TABLET | Freq: Once | ORAL | 0 refills | Status: AC

## 2017-11-19 ENCOUNTER — Encounter: Payer: Medicaid Other | Admitting: Advanced Practice Midwife

## 2017-11-27 ENCOUNTER — Encounter: Payer: Medicaid Other | Admitting: Nurse Practitioner

## 2017-12-02 ENCOUNTER — Other Ambulatory Visit: Payer: Self-pay | Admitting: Advanced Practice Midwife

## 2017-12-02 DIAGNOSIS — Z3490 Encounter for supervision of normal pregnancy, unspecified, unspecified trimester: Secondary | ICD-10-CM

## 2017-12-04 ENCOUNTER — Encounter: Payer: Self-pay | Admitting: General Practice

## 2017-12-04 ENCOUNTER — Encounter: Payer: Medicaid Other | Admitting: Medical

## 2017-12-10 MED ORDER — ONDANSETRON 4 MG PO TBDP: 4.0000 mg | ORAL_TABLET | Freq: Three times a day (TID) | ORAL | 0 refills | Status: DC | PRN

## 2017-12-13 ENCOUNTER — Encounter (HOSPITAL_COMMUNITY): Payer: Self-pay | Admitting: Emergency Medicine

## 2017-12-13 ENCOUNTER — Ambulatory Visit (HOSPITAL_COMMUNITY)
Admission: EM | Admit: 2017-12-13 | Discharge: 2017-12-13 | Disposition: A | Discharge status: Home / Self Care | Payer: Medicaid Other | Attending: Family Medicine | Admitting: Family Medicine

## 2017-12-13 DIAGNOSIS — R21 Rash and other nonspecific skin eruption: Secondary | ICD-10-CM | POA: Diagnosis not present

## 2017-12-13 MED ORDER — TRIAMCINOLONE ACETONIDE 0.1 % EX CREA: 1.0000 "application " | TOPICAL_CREAM | Freq: Two times a day (BID) | CUTANEOUS | 0 refills | Status: AC

## 2017-12-13 NOTE — ED Triage Notes (Signed)
Pt c/o bug bites on body, all over, has tried cream and benadryl without relief.

## 2017-12-13 NOTE — ED Provider Notes (Signed)
[facility]    CSN: [id number] Arrival date & time: 12/13/17  1941     History   Chief Complaint Chief Complaint  Patient presents with  . Rash    HPI Theresa Finley is a 27 y.o. female.   Jady presents with complaints of itchy rash which is scattered to her abdomen, shoulder, arms and left foot. This is the third day of symptoms. No pain but with itching. Has tried to use topical cream which has not helped. No drainage. No known bites. Itching waxes and wanes in severity. Denies any previous similar. She is 7 mo pregnant. No uri symptoms. No known allergen exposures. Husband does not have rash. No necessarily worse at night Without contributing medical history.    ROS per HPI.       Past Medical History:  Diagnosis Date  . Medical history non-contributory     Patient Active Problem List   Diagnosis Date Noted  . Supervision of low-risk pregnancy 10/22/2017  . NSVD (normal spontaneous vaginal delivery) 05/25/2014    Past Surgical History:  Procedure Laterality Date  . NO PAST SURGERIES    . ORIF RADIAL FRACTURE  07/27/2012   Procedure: OPEN REDUCTION INTERNAL FIXATION (ORIF) RADIAL FRACTURE;  Surgeon: Dominica Severin, MD;  Location: [facility];  Service: Orthopedics;  Laterality: Right;  ORIF Right Radial Head Fracture    OB History    Gravida  4   Para  2   Term  2   Preterm  0   AB  1   Living  2     SAB  1   TAB  0   Ectopic  0   Multiple  0   Live Births  2            Home Medications    Prior to Admission medications   Medication Sig Start Date End Date Taking? Authorizing Provider  Elastic Bandages & Supports MISC 1 Units by Does not apply route as needed. 10/22/17   Armando Reichert, CNM  naproxen (NAPROSYN) 250 MG tablet Take 1 tablet (250 mg total) by mouth 2 (two) times daily with a meal. Patient not taking: Reported on 01/05/2017 12/15/15   Everlene Farrier, PA-C  ondansetron (ZOFRAN ODT) 4 MG disintegrating tablet Take 1  tablet (4 mg total) by mouth every 8 (eight) hours as needed for nausea or vomiting. 12/10/17   [name] D, CNM  Prenatal Vit-Fe Fumarate-FA (PREPLUS) 27-1 MG TABS Take 1 tablet by mouth daily. 10/22/17   Armando Reichert, CNM  terconazole (TERAZOL 7) 0.4 % vaginal cream Place 1 applicator vaginally at bedtime. 10/25/17   [name] D, CNM  triamcinolone cream (KENALOG) 0.1 % Apply 1 application topically 2 (two) times daily for 5 days. 12/13/17 12/18/17  Georgetta Haber, NP    Family History Family History  Problem Relation Age of Onset  . Cancer Mother     Social History Social History   Tobacco Use  . Smoking status: Former Games developer  . Smokeless tobacco: Never Used  . Tobacco comment: Patient reports that she rarly smokes  Substance Use Topics  . Alcohol use: No  . Drug use: No     Allergies   Patient has no known allergies.   Review of Systems Review of Systems   Physical Exam Triage Vital Signs ED Triage Vitals  Enc Vitals Group     BP 12/13/17 1948 132/67     Pulse Rate 12/13/17 1948 85  Resp 12/13/17 1948 18     Temp 12/13/17 1948 98.1 F (36.7 C)     Temp src --      SpO2 12/13/17 1948 100 %     Weight --      Height --      Head Circumference --      Peak Flow --      Pain Score 12/13/17 2018 0     Pain Loc --      Pain Edu? --      Excl. in GC? --    No data found.  Updated Vital Signs BP 132/67   Pulse 85   Temp 98.1 F (36.7 C)   Resp 18   LMP 04/23/2017 (Approximate)   SpO2 100%   Visual Acuity Right Eye Distance:   Left Eye Distance:   Bilateral Distance:    Right Eye Near:   Left Eye Near:    Bilateral Near:     Physical Exam  Constitutional: She is oriented to person, place, and time. She appears well-developed and well-nourished. No distress.  Cardiovascular: Normal rate, regular rhythm and normal heart sounds.  Pulmonary/Chest: Effort normal and breath sounds normal.  Neurological: She is alert and oriented to person,  place, and time.  Skin: Skin is warm and dry. Rash noted. Rash is urticarial.  Raised skin toned lesions , a few with scabs from itching, circular. Scattered, a few to left arm, 1 to right posterior shoulder a few to abdomen and a few to dorsal left foot; scaly like in appearance; without streaking or linear pattern; no fluid or vesicles; no surrounding redness; flat raised      UC Treatments / Results  Labs (all labs ordered are listed, but only abnormal results are displayed) Labs Reviewed - No data to display  EKG None  Radiology No results found.  Procedures Procedures (including critical care time)  Medications Ordered in UC Medications - No data to display  Initial Impression / Assessment and Plan / UC Course  I have reviewed the triage vital signs and the nursing notes.  Pertinent labs & imaging results that were available during my care of the patient were reviewed by me and considered in my medical decision making (see chart for details).     Rash appears like a dermatitis. Will try treatment with kenalog. Considered bug bites, scabies, fungal rash. If symptoms worsen or do not improve in the next week to return to be seen or to follow up with PCP.  Patient verbalized understanding and agreeable to plan.    Final Clinical Impressions(s) / UC Diagnoses   Final diagnoses:  Rash and nonspecific skin eruption     Discharge Instructions     We will try a topical steroid cream to the larger affected areas.  You may continue with benadryl as needed. If no improvement or if worsening in the next 5 days please return to be seen. Limit use of cream for 5 days.    ED Prescriptions    Medication Sig Dispense Auth. Provider   triamcinolone cream (KENALOG) 0.1 % Apply 1 application topically 2 (two) times daily for 5 days. 30 g Georgetta Haber, NP     Controlled Substance Prescriptions  Controlled Substance Registry consulted? Not Applicable   Georgetta Haber,  NP 12/13/17 2026

## 2017-12-13 NOTE — Discharge Instructions (Addendum)
We will try a topical steroid cream to the larger affected areas.  You may continue with benadryl as needed. If no improvement or if worsening in the next 5 days please return to be seen. Limit use of cream for 5 days.

## 2017-12-15 ENCOUNTER — Encounter (HOSPITAL_COMMUNITY): Payer: Self-pay | Admitting: *Deleted

## 2017-12-15 ENCOUNTER — Inpatient Hospital Stay (HOSPITAL_COMMUNITY)
Admission: AD | Admit: 2017-12-15 | Discharge: 2017-12-16 | Disposition: A | Discharge status: Home / Self Care | Payer: Medicaid Other | Source: Ambulatory Visit | Attending: Obstetrics and Gynecology | Admitting: Obstetrics and Gynecology

## 2017-12-15 DIAGNOSIS — M549 Dorsalgia, unspecified: Secondary | ICD-10-CM | POA: Diagnosis not present

## 2017-12-15 DIAGNOSIS — O4703 False labor before 37 completed weeks of gestation, third trimester: Secondary | ICD-10-CM

## 2017-12-15 DIAGNOSIS — O9989 Other specified diseases and conditions complicating pregnancy, childbirth and the puerperium: Secondary | ICD-10-CM

## 2017-12-15 DIAGNOSIS — Z3A28 28 weeks gestation of pregnancy: Secondary | ICD-10-CM | POA: Insufficient documentation

## 2017-12-15 DIAGNOSIS — R109 Unspecified abdominal pain: Secondary | ICD-10-CM | POA: Diagnosis present

## 2017-12-15 DIAGNOSIS — O26893 Other specified pregnancy related conditions, third trimester: Secondary | ICD-10-CM | POA: Insufficient documentation

## 2017-12-15 DIAGNOSIS — O212 Late vomiting of pregnancy: Secondary | ICD-10-CM | POA: Diagnosis not present

## 2017-12-15 DIAGNOSIS — O219 Vomiting of pregnancy, unspecified: Secondary | ICD-10-CM | POA: Diagnosis not present

## 2017-12-15 DIAGNOSIS — Z87891 Personal history of nicotine dependence: Secondary | ICD-10-CM | POA: Diagnosis not present

## 2017-12-15 DIAGNOSIS — Z3490 Encounter for supervision of normal pregnancy, unspecified, unspecified trimester: Secondary | ICD-10-CM

## 2017-12-15 LAB — RAPID URINE DRUG SCREEN, HOSP PERFORMED
Amphetamines: NOT DETECTED
BARBITURATES: NOT DETECTED
Benzodiazepines: NOT DETECTED
COCAINE: NOT DETECTED
Opiates: NOT DETECTED
Tetrahydrocannabinol: POSITIVE — AB

## 2017-12-15 LAB — WET PREP, GENITAL
CLUE CELLS WET PREP: NONE SEEN
Sperm: NONE SEEN
TRICH WET PREP: NONE SEEN
Yeast Wet Prep HPF POC: NONE SEEN

## 2017-12-15 LAB — URINALYSIS, ROUTINE W REFLEX MICROSCOPIC
Bacteria, UA: NONE SEEN
Bilirubin Urine: NEGATIVE
GLUCOSE, UA: NEGATIVE mg/dL
Hgb urine dipstick: NEGATIVE
Ketones, ur: 5 mg/dL — AB
Leukocytes, UA: NEGATIVE
Nitrite: NEGATIVE
PROTEIN: 30 mg/dL — AB
Specific Gravity, Urine: 1.025 (ref 1.005–1.030)
pH: 5 (ref 5.0–8.0)

## 2017-12-15 MED ORDER — ONDANSETRON 4 MG PO TBDP: 4.0000 mg | ORAL_TABLET | Freq: Three times a day (TID) | ORAL | 0 refills | Status: DC | PRN

## 2017-12-15 MED ORDER — LACTATED RINGERS IV BOLUS
1000.0000 mL | Freq: Once | INTRAVENOUS | Status: AC
  Administered 2017-12-15: 1000 mL via INTRAVENOUS

## 2017-12-15 MED ORDER — CYCLOBENZAPRINE HCL 5 MG PO TABS: 5.0000 mg | ORAL_TABLET | Freq: Three times a day (TID) | ORAL | 0 refills | Status: DC | PRN

## 2017-12-15 MED ORDER — TERBUTALINE SULFATE 1 MG/ML IJ SOLN
0.2500 mg | Freq: Once | INTRAMUSCULAR | Status: AC
  Administered 2017-12-15: 0.25 mg via SUBCUTANEOUS
  Filled 2017-12-15: qty 1

## 2017-12-15 MED ORDER — NIFEDIPINE 10 MG PO CAPS
10.0000 mg | ORAL_CAPSULE | Freq: Once | ORAL | Status: DC
  Filled 2017-12-15: qty 1

## 2017-12-15 MED ORDER — CYCLOBENZAPRINE HCL 5 MG PO TABS
5.0000 mg | ORAL_TABLET | Freq: Once | ORAL | Status: AC
  Administered 2017-12-15: 5 mg via ORAL
  Filled 2017-12-15: qty 1

## 2017-12-15 MED ORDER — PROMETHAZINE HCL 25 MG/ML IJ SOLN
12.5000 mg | Freq: Once | INTRAMUSCULAR | Status: AC
  Administered 2017-12-15: 12.5 mg via INTRAVENOUS
  Filled 2017-12-15: qty 1

## 2017-12-15 NOTE — MAU Provider Note (Addendum)
History   [id number] @ 28 wks in with nausea and vomiting since 7:00 p.m. tonight. Denies vag bleeding or ROM. States good fetal movement.  CSN: [id number]  Arrival date & time 12/15/17  2104  Abdominal Pain  This is a new problem. The current episode started today. The onset quality is gradual. The problem occurs intermittently. The problem has been waxing and waning. The pain is mild. The quality of the pain is cramping. The abdominal pain does not radiate. Associated symptoms include nausea and vomiting. Pertinent negatives include no constipation or diarrhea. Nothing aggravates the pain. The pain is relieved by nothing. She has tried nothing for the symptoms.  Emesis   This is a recurrent problem. The current episode started today. The problem occurs 2 to 4 times per day. The problem has been unchanged. There has been no fever. Associated symptoms include abdominal pain. Pertinent negatives include no diarrhea. She has tried nothing for the symptoms.   RN Note: PT ARRIVED VIA EMS - SAYS VOMITING STARTED AT 7 PM - AND  ABD PAIN,.   2 CHILDREN WITH HER    Past Medical History:  Diagnosis Date  . Medical history non-contributory     Past Surgical History:  Procedure Laterality Date  . NO PAST SURGERIES    . ORIF RADIAL FRACTURE  07/27/2012   Procedure: OPEN REDUCTION INTERNAL FIXATION (ORIF) RADIAL FRACTURE;  Surgeon: Dominica Severin, MD;  Location: [facility];  Service: Orthopedics;  Laterality: Right;  ORIF Right Radial Head Fracture    Family History  Problem Relation Age of Onset  . Cancer Mother     Social History   Tobacco Use  . Smoking status: Former Games developer  . Smokeless tobacco: Never Used  . Tobacco comment: Patient reports that she rarly smokes  Substance Use Topics  . Alcohol use: No  . Drug use: No    OB History    Gravida  4   Para  2   Term  2   Preterm  0   AB  1   Living  2     SAB  1   TAB  0   Ectopic  0   Multiple  0   Live Births  2            Review of Systems  Constitutional: Negative.   HENT: Negative.   Eyes: Negative.   Respiratory: Negative.   Cardiovascular: Negative.   Gastrointestinal: Positive for abdominal pain, nausea and vomiting. Negative for constipation and diarrhea.  Endocrine: Negative.   Genitourinary: Negative.   Musculoskeletal: Negative.   Skin: Negative.   Allergic/Immunologic: Negative.   Neurological: Negative.   Hematological: Negative.   Psychiatric/Behavioral: Negative.     Allergies  Patient has no known allergies.  Home Medications    LMP 04/23/2017 (Approximate)   Physical Exam  Constitutional: She is oriented to person, place, and time. She appears well-developed and well-nourished.  HENT:  Head: Normocephalic.  Neck: Normal range of motion.  Cardiovascular: Normal rate, regular rhythm, normal heart sounds and intact distal pulses.  Pulmonary/Chest: Effort normal and breath sounds normal.  Abdominal: Soft. Bowel sounds are normal.  Genitourinary: Vagina normal and uterus normal.  Musculoskeletal: Normal range of motion.  Neurological: She is alert and oriented to person, place, and time.  Skin: Skin is warm and dry.  Psychiatric: She has a normal mood and affect. Her behavior is normal. Judgment and thought content normal.    MAU Course  Procedures (including critical  care time)  Labs Reviewed  WET PREP, GENITAL  URINALYSIS, ROUTINE W REFLEX MICROSCOPIC  GC/CHLAMYDIA PROBE AMP ([place]) NOT AT [facility]   No results found.   No diagnosis found. Pt care turned over to M. Tyrann Donaho CNM   MDM  VSS, FHR pattern reassuring, wet prep, cultures obtained. SVE firm/cl/post/high.  U/a pos ketones will IV hydrate, mild contractions 5-7 min apart will hydrate and give 1 shot of sub cu terb to calm uterine irritability.  Assumed care from prior provider Patient is doing much better, no further nausea or vomiting We did give her some Flexeril for her neck and back pain,  this did give her some relief [place] to tolerate POintake  Discharged home Rx Flexeril  for PRN use Has phenergan at home. RTO as scheduled for followup  Aviva Signs, CNM

## 2017-12-15 NOTE — MAU Note (Signed)
PT ARRIVED VIA EMS - SAYS VOMITING STARTED AT 7 PM - AND  ABD PAIN,.   2 CHILDREN WITH HER.

## 2017-12-16 DIAGNOSIS — O9989 Other specified diseases and conditions complicating pregnancy, childbirth and the puerperium: Secondary | ICD-10-CM | POA: Diagnosis not present

## 2017-12-16 DIAGNOSIS — O4703 False labor before 37 completed weeks of gestation, third trimester: Secondary | ICD-10-CM | POA: Diagnosis not present

## 2017-12-16 DIAGNOSIS — M549 Dorsalgia, unspecified: Secondary | ICD-10-CM | POA: Diagnosis not present

## 2017-12-16 DIAGNOSIS — O219 Vomiting of pregnancy, unspecified: Secondary | ICD-10-CM

## 2017-12-16 NOTE — Discharge Instructions (Signed)

## 2017-12-16 NOTE — MAU Note (Signed)
PT  AND 2 CHILDREN IN ROOM SLEEPING- WAITING ON BOYFRIEND TO PICK THEM UP.

## 2017-12-18 LAB — GC/CHLAMYDIA PROBE AMP (~~LOC~~) NOT AT ARMC
Chlamydia: NEGATIVE
NEISSERIA GONORRHEA: NEGATIVE

## 2017-12-20 ENCOUNTER — Other Ambulatory Visit: Payer: Self-pay | Admitting: *Deleted

## 2017-12-20 DIAGNOSIS — Z3490 Encounter for supervision of normal pregnancy, unspecified, unspecified trimester: Secondary | ICD-10-CM

## 2017-12-21 ENCOUNTER — Encounter: Payer: Medicaid Other | Admitting: Obstetrics and Gynecology

## 2017-12-21 ENCOUNTER — Other Ambulatory Visit: Payer: Medicaid Other

## 2018-01-03 ENCOUNTER — Inpatient Hospital Stay (HOSPITAL_COMMUNITY)
Admission: AD | Admit: 2018-01-03 | Discharge: 2018-01-03 | Discharge status: Against Medical Advice | Payer: Medicaid Other | Source: Ambulatory Visit | Attending: Obstetrics and Gynecology | Admitting: Obstetrics and Gynecology

## 2018-01-03 ENCOUNTER — Encounter (HOSPITAL_COMMUNITY): Payer: Self-pay

## 2018-01-03 ENCOUNTER — Other Ambulatory Visit: Payer: Self-pay

## 2018-01-03 DIAGNOSIS — Z3A30 30 weeks gestation of pregnancy: Secondary | ICD-10-CM | POA: Diagnosis not present

## 2018-01-03 DIAGNOSIS — E860 Dehydration: Secondary | ICD-10-CM | POA: Diagnosis not present

## 2018-01-03 DIAGNOSIS — O99283 Endocrine, nutritional and metabolic diseases complicating pregnancy, third trimester: Secondary | ICD-10-CM | POA: Insufficient documentation

## 2018-01-03 DIAGNOSIS — O9A213 Injury, poisoning and certain other consequences of external causes complicating pregnancy, third trimester: Secondary | ICD-10-CM

## 2018-01-03 DIAGNOSIS — M7918 Myalgia, other site: Secondary | ICD-10-CM

## 2018-01-03 DIAGNOSIS — Z87891 Personal history of nicotine dependence: Secondary | ICD-10-CM | POA: Diagnosis not present

## 2018-01-03 DIAGNOSIS — R55 Syncope and collapse: Secondary | ICD-10-CM | POA: Diagnosis present

## 2018-01-03 DIAGNOSIS — Z3689 Encounter for other specified antenatal screening: Secondary | ICD-10-CM

## 2018-01-03 DIAGNOSIS — O26893 Other specified pregnancy related conditions, third trimester: Secondary | ICD-10-CM | POA: Diagnosis present

## 2018-01-03 DIAGNOSIS — Z79899 Other long term (current) drug therapy: Secondary | ICD-10-CM | POA: Insufficient documentation

## 2018-01-03 DIAGNOSIS — O9A219 Injury, poisoning and certain other consequences of external causes complicating pregnancy, unspecified trimester: Secondary | ICD-10-CM

## 2018-01-03 DIAGNOSIS — Z3490 Encounter for supervision of normal pregnancy, unspecified, unspecified trimester: Secondary | ICD-10-CM

## 2018-01-03 LAB — WET PREP, GENITAL
Clue Cells Wet Prep HPF POC: NONE SEEN
SPERM: NONE SEEN
Trich, Wet Prep: NONE SEEN
YEAST WET PREP: NONE SEEN

## 2018-01-03 LAB — CBC
HCT: 33.1 % — ABNORMAL LOW (ref 36.0–46.0)
Hemoglobin: 11.1 g/dL — ABNORMAL LOW (ref 12.0–15.0)
MCH: 31.2 pg (ref 26.0–34.0)
MCHC: 33.5 g/dL (ref 30.0–36.0)
MCV: 93 fL (ref 78.0–100.0)
PLATELETS: 141 10*3/uL — AB (ref 150–400)
RBC: 3.56 MIL/uL — AB (ref 3.87–5.11)
RDW: 13.6 % (ref 11.5–15.5)
WBC: 10.1 10*3/uL (ref 4.0–10.5)

## 2018-01-03 LAB — URINALYSIS, ROUTINE W REFLEX MICROSCOPIC
BILIRUBIN URINE: NEGATIVE
Glucose, UA: NEGATIVE mg/dL
HGB URINE DIPSTICK: NEGATIVE
KETONES UR: 5 mg/dL — AB
LEUKOCYTES UA: NEGATIVE
NITRITE: NEGATIVE
PH: 5 (ref 5.0–8.0)
Protein, ur: 100 mg/dL — AB
SPECIFIC GRAVITY, URINE: 1.026 (ref 1.005–1.030)

## 2018-01-03 LAB — FETAL FIBRONECTIN: FETAL FIBRONECTIN: NEGATIVE

## 2018-01-03 MED ORDER — CYCLOBENZAPRINE HCL 10 MG PO TABS
10.0000 mg | ORAL_TABLET | Freq: Three times a day (TID) | ORAL | Status: DC | PRN
  Administered 2018-01-03: 10 mg via ORAL
  Filled 2018-01-03: qty 1

## 2018-01-03 MED ORDER — ACETAMINOPHEN 500 MG PO TABS
1000.0000 mg | ORAL_TABLET | Freq: Four times a day (QID) | ORAL | Status: DC | PRN
  Administered 2018-01-03: 1000 mg via ORAL
  Filled 2018-01-03: qty 2

## 2018-01-03 MED ORDER — ONDANSETRON 4 MG PO TBDP: 4.0000 mg | ORAL_TABLET | Freq: Three times a day (TID) | ORAL | 0 refills | Status: DC | PRN
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
## 2018-01-03 NOTE — MAU Note (Signed)
Pt arrived by EMS. Report received: pt was at work, became hot and felt dizzy.  Hx of this happening one other time. No bleeding or leaking. No report of pain.  Vital signs and cbg were WNL

## 2018-01-03 NOTE — MAU Note (Signed)
Upon entering Pts room to adjust U/S pt stated that she did not want RN to adjust monitor and that she would "adjust it herself." Pt is now refusing monitoring and wants to leave.
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
## 2018-01-04 ENCOUNTER — Encounter: Payer: Medicaid Other | Admitting: Nurse Practitioner

## 2018-01-04 LAB — GC/CHLAMYDIA PROBE AMP (~~LOC~~) NOT AT ARMC
CHLAMYDIA, DNA PROBE: NEGATIVE
Neisseria Gonorrhea: NEGATIVE

## 2018-01-08 ENCOUNTER — Encounter: Payer: Medicaid Other | Admitting: Advanced Practice Midwife

## 2018-01-11 ENCOUNTER — Encounter: Payer: Medicaid Other | Admitting: Medical

## 2018-01-18 ENCOUNTER — Encounter: Payer: Medicaid Other | Admitting: Nurse Practitioner

## 2018-01-21 ENCOUNTER — Encounter: Payer: Medicaid Other | Admitting: Obstetrics and Gynecology

## 2018-01-30 ENCOUNTER — Ambulatory Visit (INDEPENDENT_AMBULATORY_CARE_PROVIDER_SITE_OTHER): Payer: Medicaid Other | Admitting: Advanced Practice Midwife

## 2018-01-30 VITALS — BP 111/70 | HR 89 | Wt 137.2 lb

## 2018-01-30 DIAGNOSIS — O9989 Other specified diseases and conditions complicating pregnancy, childbirth and the puerperium: Secondary | ICD-10-CM | POA: Diagnosis not present

## 2018-01-30 DIAGNOSIS — Z3493 Encounter for supervision of normal pregnancy, unspecified, third trimester: Secondary | ICD-10-CM

## 2018-01-30 DIAGNOSIS — M549 Dorsalgia, unspecified: Secondary | ICD-10-CM

## 2018-01-30 DIAGNOSIS — O0933 Supervision of pregnancy with insufficient antenatal care, third trimester: Secondary | ICD-10-CM | POA: Diagnosis not present

## 2018-01-30 DIAGNOSIS — Z3490 Encounter for supervision of normal pregnancy, unspecified, unspecified trimester: Secondary | ICD-10-CM

## 2018-01-30 DIAGNOSIS — A599 Trichomoniasis, unspecified: Secondary | ICD-10-CM

## 2018-01-30 DIAGNOSIS — O0930 Supervision of pregnancy with insufficient antenatal care, unspecified trimester: Secondary | ICD-10-CM

## 2018-01-30 HISTORY — DX: Trichomoniasis, unspecified: A59.9

## 2018-01-30 NOTE — Progress Notes (Signed)
Fasting today but declines 2 hr gtt- agreed to try to do 1 hr gtt. States still having nausea. Declines tdap.

## 2018-01-30 NOTE — Progress Notes (Signed)
   PRENATAL VISIT NOTE  Subjective:  Theresa Finley is a 27 y.o. [id number] at 4576w4d being seen today for ongoing prenatal care.  She is currently monitored for the following issues for this low-risk pregnancy and has NSVD (normal spontaneous vaginal delivery); Supervision of low-risk pregnancy; and Trichomonas infection on their problem list.  Patient reports backache, no bleeding, no contractions, no cramping, no leaking and occasional contractions. Patient reports she has chronic back pain that has worsened as her pregnancy has progressed. Reports she has been paying for massages and taking Flexeril but those interventions no longer help. Requesting referral to Chiropractor.   Contractions: Irregular. Vag. Bleeding: None.  Movement: Present. Denies leaking of fluid.   The following portions of the patient's history were reviewed and updated as appropriate: allergies, current medications, past family history, past medical history, past social history, past surgical history and problem list. Problem list updated.  Objective:   Vitals:   01/30/18 1003  BP: 111/70  Pulse: 89  Weight: 137 lb 3.2 oz (62.2 kg)    Fetal Status: Fetal Heart Rate (bpm): 127 Fundal Height: 34 cm Movement: Present     General:  Alert, oriented and cooperative. Patient is in no acute distress.  Skin: Skin is warm and dry. No rash noted.   Cardiovascular: Normal heart rate noted  Respiratory: Normal respiratory effort, no problems with respiration noted  Abdomen: Soft, gravid, appropriate for gestational age.  Pain/Pressure: Present     Pelvic: Cervical exam deferred        Extremities: Normal range of motion.  Edema: Trace  Mental Status: Normal mood and affect. Normal behavior. Normal judgment and thought content.   Assessment and Plan:  Pregnancy: [id number] at 6076w4d  1. Trichomonas infection Negative wet prep 12/15/17  2. Supervision of low-risk pregnancy, third trimester - Glucose tolerance, 1 hour -  RPR - HIV antibody - CBC  3. Limited prenatal care, antepartum - US MFM OB FOLLOW UP; Future  4. Back pain affecting pregnancy in third trimester --Ambulatory referral to physical therapy ordered. Discussed PT vs. Chiropractor for availability of interventions during pregnancy, long-term interventions including home stretches  Patient left before labs could be drawn. Initially waited in lobby but left prior to one hour  Preterm labor symptoms and general obstetric precautions including but not limited to vaginal bleeding, contractions, leaking of fluid and fetal movement were reviewed in detail with the patient. Please refer to After Visit Summary for other counseling recommendations.  Return in about 2 weeks (around 02/13/2018).  Future Appointments  Date Time Provider Department Center  02/08/2018  4:00 PM [facility] US 3 [facility]    Calvert CantorSamantha C Arabela Basaldua, CNM  01/30/18  11:09 AM

## 2018-01-30 NOTE — Patient Instructions (Signed)
Skin to Skin After delivery, the staff will place your baby on your chest. This helps with the following: . Regulates baby's temperature, breathing, heart rate and blood sugar . Increases Mom's milk supply . Promotes bonding . Keeps baby and Mom calm and decreases baby's crying  

## 2018-02-08 ENCOUNTER — Ambulatory Visit (HOSPITAL_COMMUNITY): Payer: Medicaid Other

## 2018-02-13 ENCOUNTER — Ambulatory Visit: Payer: Medicaid Other | Attending: Advanced Practice Midwife | Admitting: Physical Therapy

## 2018-02-13 ENCOUNTER — Encounter: Payer: Self-pay | Admitting: Student

## 2018-02-13 ENCOUNTER — Encounter: Payer: Medicaid Other | Admitting: Student

## 2018-02-13 ENCOUNTER — Other Ambulatory Visit: Payer: Self-pay

## 2018-02-13 DIAGNOSIS — M545 Low back pain, unspecified: Secondary | ICD-10-CM

## 2018-02-13 DIAGNOSIS — O0933 Supervision of pregnancy with insufficient antenatal care, third trimester: Secondary | ICD-10-CM | POA: Insufficient documentation

## 2018-02-13 DIAGNOSIS — M6281 Muscle weakness (generalized): Secondary | ICD-10-CM | POA: Insufficient documentation

## 2018-02-13 DIAGNOSIS — G8929 Other chronic pain: Secondary | ICD-10-CM | POA: Insufficient documentation

## 2018-02-13 DIAGNOSIS — M542 Cervicalgia: Secondary | ICD-10-CM | POA: Diagnosis present

## 2018-02-13 NOTE — Patient Instructions (Signed)
Access Code: [email or web address]  URL: [email or web address]  Date: 02/13/2018  Prepared by: Eulis Fosterheryl Obi Scrima   Exercises  Quadruped Cat Camel - 10 reps - 1 sets - 1x daily - 7x weekly  Child's Pose Stretch - 2 reps - 1 sets - 30 hold - 1x daily - 7x weekly  Seated Hamstring Stretch - 2 reps - 1 sets - 30 hold - 1x daily - 7x weekly  Seated Piriformis Stretch with Trunk Bend - 2 reps - 1 sets - 30 hold - 1x daily - 7x weekly  Seated Cervical Sidebending Stretch - 2 reps - 1 sets - 15 hold - 1x daily - 7x weekly  Seated Levator Scapulae Stretch - 2 reps - 1 sets - 15 hold - 1x daily - 7x weekly  [facility] [address], [address] [place], [place] [address] Phone # [phone] Fax [phone]

## 2018-02-13 NOTE — Therapy (Addendum)
[facility] [address] [place], [place], [address] Phone: [phone]   Fax:  [phone]  Physical Therapy Evaluation  Patient Details  Name: Theresa Finley MRN: [id number] Date of Birth: 26-Sep-1990 Referring Provider: Dr. Mallie Snooks   Encounter Date: 02/13/2018  PT End of Session - 02/13/18 1212    Visit Number  1    Date for PT Re-Evaluation  03/13/18    Authorization Type  Medicaid    PT Start Time  0845    PT Stop Time  0925    PT Time Calculation (min)  40 min    Activity Tolerance  Patient tolerated treatment well    Behavior During Therapy  [facility] for tasks assessed/performed       Past Medical History:  Diagnosis Date  . Medical history non-contributory     Past Surgical History:  Procedure Laterality Date  . ORIF RADIAL FRACTURE  07/27/2012   Procedure: OPEN REDUCTION INTERNAL FIXATION (ORIF) RADIAL FRACTURE;  Surgeon: Roseanne Kaufman, MD;  Location: [place];  Service: Orthopedics;  Laterality: Right;  ORIF Right Radial Head Fracture    There were no vitals filed for this visit.   Subjective Assessment - 02/13/18 0850    Subjective  I have pain in neck and back. Pain is chronic 2 years ago with sudden onset. Patient has 2 other children.  Patient is due 03/09/2018.  Patient has had her past pregnancies early.     Patient Stated Goals  reduce pain    Currently in Pain?  Yes    Pain Score  9     Pain Location  Back    Pain Orientation  Lower    Pain Descriptors / Indicators  Burning;Sharp;Tightness    Pain Type  Chronic pain    Pain Onset  More than a month ago    Pain Frequency  Constant    Aggravating Factors   lay in one position, standing    Pain Relieving Factors  medication, soak in bath water, icy hot, massages    Multiple Pain Sites  Yes    Pain Score  10    Pain Location  Neck    Pain Orientation  Left;Right    Pain Descriptors / Indicators  Tightness;Sharp;Burning    Pain Type   Chronic pain    Pain Onset  More than a month ago    Pain Frequency  Constant    Aggravating Factors   one position; sitting    Pain Relieving Factors  medication, change position         [facility] PT Assessment - 02/13/18 0001      Assessment   Medical Diagnosis  Z34.90 encounter for supervision of low-risk pregnancy antepartum; O09.30limited prenatal care, antepartum; 099.89, M54.9 back pain ffecting prenancy in third trimester    Referring Provider  Dr. Mallie Snooks    Onset Date/Surgical Date  02/14/16    Prior Therapy  None      Precautions   Precautions  Other (comment)    Precaution Comments  pregnant      Restrictions   Weight Bearing Restrictions  No      Balance Screen   Has the patient fallen in the past 6 months  No    Has the patient had a decrease in activity level because of a fear of falling?   No    Is the patient reluctant to leave their home because of a fear of falling?  No      Home Film/video editor residence      Prior Function   Level of Independence  Independent    Leisure  walk      Cognition   Overall Cognitive Status  Within Functional Limits for tasks assessed      Posture/Postural Control   Posture/Postural Control  Postural limitations    Postural Limitations  Rounded Shoulders;Forward head;Increased lumbar lordosis pregnant posture      ROM / Strength   AROM / PROM / Strength  AROM;PROM;Strength      AROM   Cervical Flexion  decreased by 25%    Cervical Extension  decreased by 25%    Cervical - Right Side Bend  full    Cervical - Left Side Bend  decreased by 25%    Cervical - Right Rotation  decreased by 25%    Cervical - Left Rotation  decreased by 25%    Lumbar - Left Side Bend  decreased by 25%      Strength   Overall Strength Comments  bilateral shoulder strength is 3+/5; bilateral hip strength is 4/5, bil. hip abduction 35                Objective measurements completed on examination:  See above findings.              PT Education - 02/13/18 0914    Education Details  Access Code: [email or web address]     Person(s) Educated  Patient    Methods  Explanation;Demonstration;Verbal cues;Handout    Comprehension  Verbalized understanding;Returned demonstration          PT Long Term Goals - 02/13/18 0926      PT LONG TERM GOAL #1   Title  independent with HEP and understand how to progress herself    Baseline  not educated yet    Time  4    Period  Weeks    Status  New    Target Date  03/13/18      PT LONG TERM GOAL #2   Title  ability to perform daily tasks of taking care of her children with pain decreased >/= 5/10 due to improved body mechanics and strength    Time  4    Period  Weeks    Status  New    Target Date  03/13/18      PT LONG TERM GOAL #3   Title  understand how to perform daily  tasks with correct body mechanincs to decrease strain on the back    Baseline  not educated yet    Time  4    Period  Weeks    Status  New    Target Date  03/13/18      PT LONG TERM GOAL #4   Title  ability to stand for 15 minutes at the store with pain level </= 5/10 due to increased strength of hips >/= 4+/5    Baseline  bil. hip abduction 3+/5 and rest is 4/5    Time  4    Period  Weeks    Status  New    Target Date  03/13/18             Plan - 02/13/18 0914    Clinical Impression Statement  Patient is a 27 year old female with chronic neck and back pain for the past 2 years with sudden onset. Patient reports a constant pain at 9/10 that  can increase to 10/10.  Patient reports her pain is worse with static position for over 5 minutes, laying down, lifting and pulling items.  Patient pain is better with medication, soak in bathtub, frequent change in postion.  Patient will be 37 week  on 02/16/2018 and due on 03/09/2018.  Patient has had 2 children prior to this pregnancy without complication.  Patient posture consists of forward head, rounded shoulders,  slouched posture, and pregnant posture.  During the initial evaluation, she had to constantly change her position to relieve her discomfort.  Cervical ROM is decreased by 25% and lumbar flexion, left sidebending decreased by 25%. Bilateral shoulder strength is 3+/5 and bilateral hip strength is 4/5 with exception of abduction is 3+/5.  Tenderness located in lumbar paraspinals and cervical paraspinals.  Patient will benefit from skilled therapy to reduce her pain to improve her function and taking care of her children.     History and Personal Factors relevant to plan of care:  due on 03/09/2018 with her third child    Clinical Presentation  Evolving    Clinical Presentation due to:  difficulty with performing daily tasks due to pain    Clinical Decision Making  Low    Rehab Potential  Excellent    Clinical Impairments Affecting Rehab Potential  due on 03/09/2018 with her third child    PT Frequency  1x / week    PT Duration  4 weeks    PT Treatment/Interventions  Cryotherapy;Moist Heat;Electrical Stimulation;Therapeutic activities;Therapeutic exercise;Patient/family education;Neuromuscular re-education;Manual techniques;Energy conservation;Taping electrical stimulation after she has the baby    PT Next Visit Plan  soft tissue work, stabilization exercises for lumbar; stretches; heat; posture; body mechanincs    PT Home Exercise Plan  Access Code: [email or web address]     Consulted and Agree with Plan of Care  Patient       Patient will benefit from skilled therapeutic intervention in order to improve the following deficits and impairments:  Pain, Decreased activity tolerance, Decreased range of motion, Decreased strength, Increased muscle spasms  Visit Diagnosis: Chronic low back pain without sciatica, unspecified back pain laterality - Plan: PT plan of care cert/re-cert  Cervicalgia - Plan: PT plan of care cert/re-cert  Muscle weakness (generalized) - Plan: PT plan of care cert/re-cert     Problem  List Patient Active Problem List   Diagnosis Date Noted  . Limited prenatal care in third trimester 02/13/2018  . Trichomonas infection 01/30/2018  . Supervision of low-risk pregnancy 10/22/2017    Earlie Counts, PT 02/13/18 12:13 PM   [facility] [address] [place], [place], [address] Phone: [phone]   Fax:  [phone]  Name: Theresa Finley MRN: [id number] Date of Birth: [birth year] PHYSICAL THERAPY DISCHARGE SUMMARY  Visits from Start of Care: 1  Current functional level related to goals / functional outcomes: See above.  Patient no showed for her last 2 appointments. Patient gave birth to the baby on 03/08/2018.    Remaining deficits: See above.    Education / Equipment: HEP Plan:                                                    Patient goals were not met. Patient is being discharged due to the patient's request.  Thank you for the referral. Earlie Counts, PT  03/11/18 2:16 PM  ?????

## 2018-02-15 ENCOUNTER — Encounter: Payer: Medicaid Other | Admitting: Student

## 2018-02-18 ENCOUNTER — Ambulatory Visit (HOSPITAL_COMMUNITY): Payer: Medicaid Other | Attending: Advanced Practice Midwife

## 2018-02-18 ENCOUNTER — Other Ambulatory Visit: Payer: Self-pay

## 2018-02-18 ENCOUNTER — Encounter (HOSPITAL_COMMUNITY): Payer: Self-pay | Admitting: *Deleted

## 2018-02-18 ENCOUNTER — Inpatient Hospital Stay (HOSPITAL_COMMUNITY)
Admission: AD | Admit: 2018-02-18 | Discharge: 2018-02-18 | Disposition: A | Discharge status: Home / Self Care | Payer: Medicaid Other | Source: Ambulatory Visit | Attending: Family Medicine | Admitting: Family Medicine

## 2018-02-18 DIAGNOSIS — Z3A37 37 weeks gestation of pregnancy: Secondary | ICD-10-CM

## 2018-02-18 DIAGNOSIS — Z87891 Personal history of nicotine dependence: Secondary | ICD-10-CM | POA: Insufficient documentation

## 2018-02-18 DIAGNOSIS — O479 False labor, unspecified: Secondary | ICD-10-CM

## 2018-02-18 DIAGNOSIS — O471 False labor at or after 37 completed weeks of gestation: Secondary | ICD-10-CM | POA: Diagnosis present

## 2018-02-18 DIAGNOSIS — Z0371 Encounter for suspected problem with amniotic cavity and membrane ruled out: Secondary | ICD-10-CM

## 2018-02-18 DIAGNOSIS — Z3689 Encounter for other specified antenatal screening: Secondary | ICD-10-CM

## 2018-02-18 DIAGNOSIS — O368390 Maternal care for abnormalities of the fetal heart rate or rhythm, unspecified trimester, not applicable or unspecified: Secondary | ICD-10-CM

## 2018-02-18 LAB — AMNISURE RUPTURE OF MEMBRANE (ROM) NOT AT ARMC: Amnisure ROM: NEGATIVE

## 2018-02-18 LAB — POCT FERN TEST: POCT FERN TEST: NEGATIVE

## 2018-02-18 MED ORDER — CALCIUM CARBONATE ANTACID 500 MG PO CHEW
2.0000 | CHEWABLE_TABLET | Freq: Once | ORAL | Status: AC
  Administered 2018-02-18: 400 mg via ORAL
  Filled 2018-02-18: qty 2

## 2018-02-18 MED ORDER — CYCLOBENZAPRINE HCL 10 MG PO TABS
10.0000 mg | ORAL_TABLET | Freq: Once | ORAL | Status: AC
Start: 2018-02-18 — End: 2018-02-18
  Administered 2018-02-18: 10 mg via ORAL
  Filled 2018-02-18: qty 1

## 2018-02-18 NOTE — MAU Note (Signed)
Has not returned

## 2018-02-18 NOTE — MAU Note (Signed)
Pt called, daughter had an accident, she left to get her a change of clothes for her and will be back.

## 2018-02-18 NOTE — Discharge Instructions (Signed)
Braxton Hicks Contractions °Contractions of the uterus can occur throughout pregnancy, but they are not always a sign that you are in labor. You may have practice contractions called Braxton Hicks contractions. These false labor contractions are sometimes confused with true labor. °What are Braxton Hicks contractions? °Braxton Hicks contractions are tightening movements that occur in the muscles of the uterus before labor. Unlike true labor contractions, these contractions do not result in opening (dilation) and thinning of the cervix. Toward the end of pregnancy (32-34 weeks), Braxton Hicks contractions can happen more often and may become stronger. These contractions are sometimes difficult to tell apart from true labor because they can be very uncomfortable. You should not feel embarrassed if you go to the hospital with false labor. °Sometimes, the only way to tell if you are in true labor is for your health care provider to look for changes in the cervix. The health care provider will do a physical exam and may monitor your contractions. If you are not in true labor, the exam should show that your cervix is not dilating and your water has not broken. °If there are other health problems associated with your pregnancy, it is completely safe for you to be sent home with false labor. You may continue to have Braxton Hicks contractions until you go into true labor. °How to tell the difference between true labor and false labor °True labor °· Contractions last 30-70 seconds. °· Contractions become very regular. °· Discomfort is usually felt in the top of the uterus, and it spreads to the lower abdomen and low back. °· Contractions do not go away with walking. °· Contractions usually become more intense and increase in frequency. °· The cervix dilates and gets thinner. °False labor °· Contractions are usually shorter and not as strong as true labor contractions. °· Contractions are usually irregular. °· Contractions  are often felt in the front of the lower abdomen and in the groin. °· Contractions may go away when you walk around or change positions while lying down. °· Contractions get weaker and are shorter-lasting as time goes on. °· The cervix usually does not dilate or become thin. °Follow these instructions at home: °· Take over-the-counter and prescription medicines only as told by your health care provider. °· Keep up with your usual exercises and follow other instructions from your health care provider. °· Eat and drink lightly if you think you are going into labor. °· If Braxton Hicks contractions are making you uncomfortable: °? Change your position from lying down or resting to walking, or change from walking to resting. °? Sit and rest in a tub of warm water. °? Drink enough fluid to keep your urine pale yellow. Dehydration may cause these contractions. °? Do slow and deep breathing several times an hour. °· Keep all follow-up prenatal visits as told by your health care provider. This is important. °Contact a health care provider if: °· You have a fever. °· You have continuous pain in your abdomen. °Get help right away if: °· Your contractions become stronger, more regular, and closer together. °· You have fluid leaking or gushing from your vagina. °· You pass blood-tinged mucus (bloody show). °· You have bleeding from your vagina. °· You have low back pain that you never had before. °· You feel your baby’s head pushing down and causing pelvic pressure. °· Your baby is not moving inside you as much as it used to. °Summary °· Contractions that occur before labor are called Braxton   Hicks contractions, false labor, or practice contractions. °· Braxton Hicks contractions are usually shorter, weaker, farther apart, and less regular than true labor contractions. True labor contractions usually become progressively stronger and regular and they become more frequent. °· Manage discomfort from Braxton Hicks contractions by  changing position, resting in a warm bath, drinking plenty of water, or practicing deep breathing. °This information is not intended to replace advice given to you by your health care provider. Make sure you discuss any questions you have with your health care provider. °Document Released: 11/23/2016 Document Revised: 11/23/2016 Document Reviewed: 11/23/2016 °Elsevier Interactive Patient Education © 2018 [employer] Inc[employer] ° °

## 2018-02-18 NOTE — MAU Provider Note (Signed)
History     CSN: [id number]  Arrival date and time: 02/18/18 1556   First Provider Initiated Contact with Patient 02/18/18 2030      Chief Complaint  Patient presents with  . Contractions  . Rupture of Membranes   HPI   [name] is a [age] y.o. female here in MAU with leaking of fluid. The leaking started yesterday. The first time she noticed it was when she was vomiting and the other times was when she changed her positions. No bleeding, + fetal movement. Has had limited prenatal care in this pregnancy.   OB History    Gravida  4   Para  2   Term  2   Preterm  0   AB  1   Living  2     SAB  1   TAB  0   Ectopic  0   Multiple  0   Live Births  2           Past Medical History:  Diagnosis Date  . Medical history non-contributory     Past Surgical History:  Procedure Laterality Date  . ORIF RADIAL FRACTURE  07/27/2012   Procedure: OPEN REDUCTION INTERNAL FIXATION (ORIF) RADIAL FRACTURE;  Surgeon: Dominica SeverinWilliam Gramig, MD;  Location: [facility];  Service: Orthopedics;  Laterality: Right;  ORIF Right Radial Head Fracture    Family History  Problem Relation Age of Onset  . Cancer Mother     Social History   Tobacco Use  . Smoking status: Former Smoker    Last attempt to quit: 02/18/2013    Years since quitting: 5.0  . Smokeless tobacco: Never Used  . Tobacco comment: Patient reports that she rarly smokes  Substance Use Topics  . Alcohol use: No  . Drug use: Yes    Types: Marijuana    Comment: last use of weed 02/16/2018    Allergies: No Known Allergies  Medications Prior to Admission  Medication Sig Dispense Refill Last Dose  . cyclobenzaprine (FLEXERIL) 5 MG tablet Take 1 tablet (5 mg total) by mouth 3 (three) times daily as needed for muscle spasms. 15 tablet 0 02/17/2018 at Unknown time  . ondansetron (ZOFRAN ODT) 4 MG disintegrating tablet Take 1 tablet (4 mg total) by mouth every 8 (eight) hours as needed for nausea or vomiting. 20 tablet 0  02/17/2018 at Unknown time  . Elastic Bandages & Supports MISC 1 Units by Does not apply route as needed. 1 each 0    Results for orders placed or performed during the hospital encounter of 02/18/18 (from the past 48 hour(s))  Fern Test     Status: None   Collection Time: 02/18/18  7:55 PM  Result Value Ref Range   POCT Fern Test Negative = intact amniotic membranes   Amnisure rupture [facility]rom[facility]     Status: None   Collection Time: 02/18/18  8:01 PM  Result Value Ref Range   Amnisure ROM NEGATIVE     Comment: Performed at [facility], [address] [place], [place] [address]    Review of Systems  Constitutional: Negative for fever.  Gastrointestinal: Positive for abdominal pain.  Genitourinary: Positive for vaginal discharge. Negative for vaginal bleeding.   Physical Exam   Blood pressure 112/60, pulse 89, temperature 98.7 F (37.1 C), temperature source Oral, resp. rate 16, height 5\' 4"  (1.626 m), weight 142 lb (64.4 kg), last menstrual period 04/23/2017, SpO2 100 %.  Physical Exam  Constitutional:  She is oriented to person, place, and time. She appears well-developed and well-nourished. No distress.  HENT:  Head: Normocephalic.  Eyes: Pupils are equal, round, and reactive to light.  Genitourinary:  Genitourinary Comments: Dilation: 3.5 Effacement (%): 60 Cervical Position: Posterior Station: -3 Presentation: Vertex Exam by:: K. Marijo File  Musculoskeletal: Normal range of motion.  Neurological: She is alert and oriented to person, place, and time.  Skin: Skin is warm. She is not diaphoretic.  Psychiatric: Her behavior is normal.   Fetal Tracing: Baseline: 130 bpm Variability: Moderate  Accelerations: 15x15 Decelerations: @ 1920 variable deceleration down to 100 lasting 90 sections. Moderate variability noted after decel.  Toco: UI  MAU Course  Procedures  None  MDM  Fern slide negative Amnisure negative Flexeril given for back pain.    Assessment and Plan   A:  1. Encounter for suspected PROM, with rupture of membranes not found   2. Irregular uterine contractions   3. [redacted] weeks gestation of pregnancy   4. Antepartum variable deceleration   5. NST (non-stress test) reactive     P:  Discharge home in stable condition Labor precautions Return to MAU if symptoms worsen Fetal kick counts F/U in the office as scheduled, discussed the importance of keeping appointments.  Theresa Finley 02/18/2018, 9:03 PM

## 2018-02-18 NOTE — MAU Note (Signed)
Not in lobby

## 2018-02-18 NOTE — MAU Note (Signed)
Pt returned.  Started started leaking on Saturday. Clear fluid.  Had some again yesterday, at first it came out when she vomited.  Yesterday and today, had some leaking not related to anything.  Has had some really bad contractions, really sharp.

## 2018-02-19 ENCOUNTER — Ambulatory Visit: Payer: Medicaid Other | Admitting: Physical Therapy

## 2018-02-22 ENCOUNTER — Ambulatory Visit: Payer: Medicaid Other | Attending: Advanced Practice Midwife | Admitting: Physical Therapy

## 2018-02-22 ENCOUNTER — Encounter (HOSPITAL_COMMUNITY): Payer: Self-pay | Admitting: *Deleted

## 2018-02-22 ENCOUNTER — Inpatient Hospital Stay (HOSPITAL_COMMUNITY)
Admission: AD | Admit: 2018-02-22 | Discharge: 2018-02-22 | Disposition: A | Discharge status: Home / Self Care | Payer: Medicaid Other | Source: Ambulatory Visit | Attending: Family Medicine | Admitting: Family Medicine

## 2018-02-22 ENCOUNTER — Telehealth: Payer: Self-pay | Admitting: Physical Therapy

## 2018-02-22 DIAGNOSIS — Z0371 Encounter for suspected problem with amniotic cavity and membrane ruled out: Secondary | ICD-10-CM | POA: Diagnosis present

## 2018-02-22 DIAGNOSIS — O0933 Supervision of pregnancy with insufficient antenatal care, third trimester: Secondary | ICD-10-CM | POA: Diagnosis not present

## 2018-02-22 DIAGNOSIS — Z3493 Encounter for supervision of normal pregnancy, unspecified, third trimester: Secondary | ICD-10-CM

## 2018-02-22 DIAGNOSIS — Z3A37 37 weeks gestation of pregnancy: Secondary | ICD-10-CM | POA: Insufficient documentation

## 2018-02-22 LAB — AMNISURE RUPTURE OF MEMBRANE (ROM) NOT AT ARMC: AMNISURE: NEGATIVE

## 2018-02-22 LAB — POCT FERN TEST: POCT FERN TEST: NEGATIVE

## 2018-02-22 MED ORDER — ACETAMINOPHEN 500 MG PO TABS
1000.0000 mg | ORAL_TABLET | Freq: Once | ORAL | Status: AC
  Administered 2018-02-22: 1000 mg via ORAL
  Filled 2018-02-22: qty 2

## 2018-02-22 NOTE — MAU Note (Signed)
Pt stated she had a gush of fluid come out about an hour ago. C/o ctx as well. Good fetal movement reported/

## 2018-02-22 NOTE — MAU Note (Signed)
Informed pt that provider order a GBS swab to be collected as routine for term patients. Pt stated I should have done that when we did the last swab we did a few minutes ago to rule out ruptured. explained d it was 2 different t tests and we need to use a different q-tip collection. Pt stated "be bothered with that now." Explained further that it was important to know if this bacteria was present so her baby will not get an infection after birth. Pt responded "Babies have all sorts of things wrong with them that you don't know about when they are born." Informed H.Hogan,CNM of pts refusal.

## 2018-02-22 NOTE — MAU Provider Note (Addendum)
First Provider Initiated Contact with Patient 02/22/18 1607       S: Ms. Theresa Finley is a 27 y.o. [id number] at 1846w6d  who presents to MAU today complaining of one small gush of fluid "earlier today". Not sure of the time. She denies vaginal bleeding. She endorses contractions. She reports normal fetal movement.    O: BP (!) 99/52   Pulse (!) 107   Temp 98.3 F (36.8 C)   Resp 18   Ht 5\' 4"  (1.626 m)   Wt 143 lb (64.9 kg)   LMP 04/23/2017 (Approximate)   BMI 24.55 kg/m  GENERAL: Well-developed, well-nourished female in no acute distress.  HEAD: Normocephalic, atraumatic.  CHEST: Normal effort of breathing, regular heart rate ABDOMEN: Soft, nontender, gravid PELVIC: Patient refuses pelvic exam. She is willing to self collect an amnisure.   Cervical exam: Patient refuses pelvic exam    Fetal Monitoring: Baseline: 140 Variability: moderate Accelerations: 15x15 Decelerations: none Contractions: irreg  Results for orders placed or performed during the hospital encounter of 02/22/18 (from the past 24 hour(s))  Amnisure rupture [facility] membrane (rom)not at [facility]     Status: None   Collection Time: 02/22/18  3:48 PM  Result Value Ref Range   Amnisure ROM NEGATIVE      A: SIUP at 7646w6d  Membranes intact  P: DC home  Patient refuses to have GBS collected today   Theresa ReichertHogan, Shamarion Coots D, CNM 02/22/2018 4:10 PM

## 2018-02-22 NOTE — Telephone Encounter (Signed)
Called patient about her appoint on 02/22/2018 at 11 that she no-showed for.  The person who answered hung up on Therapist.  Theresa Finley, PT @8 /08/2017@ 11:24 AM

## 2018-02-22 NOTE — Discharge Instructions (Signed)
Vaginal delivery means that you will give birth by pushing your baby out of your birth canal (vagina). A team of health care providers will help you before, during, and after vaginal delivery. Birth experiences are unique for every woman and every pregnancy, and birth experiences vary depending on where you choose to give birth. What should I do to prepare for my baby's birth? Before your baby is born, it is important to talk with your health care provider about:  Your labor and delivery preferences. These may include: ? Medicines that you may be given. ? How you will manage your pain. This might include non-medical pain relief techniques or injectable pain relief such as epidural analgesia. ? How you and your baby will be monitored during labor and delivery. ? Who may be in the labor and delivery room with you. ? Your feelings about surgical delivery of your baby (cesarean delivery, or C-section) if this becomes necessary. ? Your feelings about receiving donated blood through an IV tube (blood transfusion) if this becomes necessary.  Whether you are Archila: ? To take pictures or videos of the birth. ? To eat during labor and delivery. ? To move around, walk, or change positions during labor and delivery.  What to expect after your baby is born, such as: ? Whether delayed umbilical cord clamping and cutting is offered. ? Who will care for your baby right after birth. ? Medicines or tests that may be recommended for your baby. ? Whether breastfeeding is supported in your hospital or birth center. ? How long you will be in the hospital or birth center.  How any medical conditions you have may affect your baby or your labor and delivery experience.  To prepare for your baby's birth, you should also:  Attend all of your health care visits before delivery (prenatal visits) as recommended by your health care provider. This is important.  Prepare your home for your baby's arrival. Make sure  that you have: ? Diapers. ? Baby clothing. ? Feeding equipment. ? Safe sleeping arrangements for you and your baby.  Install a car seat in your vehicle. Have your car seat checked by a certified car seat installer to make sure that it is installed safely.  Think about who will help you with your new baby at home for at least the first several weeks after delivery.  What can I expect when I arrive at the birth center or hospital? Once you are in labor and have been admitted into the hospital or birth center, your health care provider may:  Review your pregnancy history and any concerns you have.  Insert an IV tube into one of your veins. This is used to give you fluids and medicines.  Check your blood pressure, pulse, temperature, and heart rate (vital signs).  Check whether your bag of water (amniotic sac) has broken (ruptured).  Talk with you about your birth plan and discuss pain control options.  Monitoring Your health care provider may monitor your contractions (uterine monitoring) and your baby's heart rate (fetal monitoring). You may need to be monitored:  Often, but not continuously (intermittently).  All the time or for long periods at a time (continuously). Continuous monitoring may be needed if: ? You are taking certain medicines, such as medicine to relieve pain or make your contractions stronger. ? You have pregnancy or labor complications.  Monitoring may be done by:  Placing a special stethoscope or a handheld monitoring device on your abdomen to check your   baby's heartbeat, and feeling your abdomen for contractions. This method of monitoring does not continuously record your baby's heartbeat or your contractions.  Placing monitors on your abdomen (external monitors) to record your baby's heartbeat and the frequency and length of contractions. You may not have to wear external monitors all the time.  Placing monitors inside of your uterus (internal monitors) to  record your baby's heartbeat and the frequency, length, and strength of your contractions. ? Your health care provider may use internal monitors if he or she needs more information about the strength of your contractions or your baby's heart rate. ? Internal monitors are put in place by passing a thin, flexible wire through your vagina and into your uterus. Depending on the type of monitor, it may remain in your uterus or on your baby's head until birth. ? Your health care provider will discuss the benefits and risks of internal monitoring with you and will ask for your permission before inserting the monitors.  Telemetry. This is a type of continuous monitoring that can be done with external or internal monitors. Instead of having to stay in bed, you are Rosenberg to move around during telemetry. Ask your health care provider if telemetry is an option for you.  Physical exam Your health care provider may perform a physical exam. This may include:  Checking whether your baby is positioned: ? With the head toward your vagina (head-down). This is most common. ? With the head toward the top of your uterus (head-up or breech). If your baby is in a breech position, your health care provider may try to turn your baby to a head-down position so you can deliver vaginally. If it does not seem that your baby can be born vaginally, your provider may recommend surgery to deliver your baby. In rare cases, you may be Ancheta to deliver vaginally if your baby is head-up (breech delivery). ? Lying sideways (transverse). Babies that are lying sideways cannot be delivered vaginally.  Checking your cervix to determine: ? Whether it is thinning out (effacing). ? Whether it is opening up (dilating). ? How low your baby has moved into your birth canal.  What are the three stages of labor and delivery?  Normal labor and delivery is divided into the following three stages: Stage 1  Stage 1 is the longest stage of labor,  and it can last for hours or days. Stage 1 includes: ? Early labor. This is when contractions may be irregular, or regular and mild. Generally, early labor contractions are more than 10 minutes apart. ? Active labor. This is when contractions get longer, more regular, more frequent, and more intense. ? The transition phase. This is when contractions happen very close together, are very intense, and may last longer than during any other part of labor.  Contractions generally feel mild, infrequent, and irregular at first. They get stronger, more frequent (about every 2-3 minutes), and more regular as you progress from early labor through active labor and transition.  Many women progress through stage 1 naturally, but you may need help to continue making progress. If this happens, your health care provider may talk with you about: ? Rupturing your amniotic sac if it has not ruptured yet. ? Giving you medicine to help make your contractions stronger and more frequent.  Stage 1 ends when your cervix is completely dilated to 4 inches (10 cm) and completely effaced. This happens at the end of the transition phase. Stage 2  Once your cervix   is completely effaced and dilated to 4 inches (10 cm), you may start to feel an urge to push. It is common for the body to naturally take a rest before feeling the urge to push, especially if you received an epidural or certain other pain medicines. This rest period may last for up to 1-2 hours, depending on your unique labor experience.  During stage 2, contractions are generally less painful, because pushing helps relieve contraction pain. Instead of contraction pain, you may feel stretching and burning pain, especially when the widest part of your baby's head passes through the vaginal opening (crowning).  Your health care provider will closely monitor your pushing progress and your baby's progress through the vagina during stage 2.  Your health care provider may  massage the area of skin between your vaginal opening and anus (perineum) or apply warm compresses to your perineum. This helps it stretch as the baby's head starts to crown, which can help prevent perineal tearing. ? In some cases, an incision may be made in your perineum (episiotomy) to allow the baby to pass through the vaginal opening. An episiotomy helps to make the opening of the vagina larger to allow more room for the baby to fit through.  It is very important to breathe and focus so your health care provider can control the delivery of your baby's head. Your health care provider may have you decrease the intensity of your pushing, to help prevent perineal tearing.  After delivery of your baby's head, the shoulders and the rest of the body generally deliver very quickly and without difficulty.  Once your baby is delivered, the umbilical cord may be cut right away, or this may be delayed for 1-2 minutes, depending on your baby's health. This may vary among health care providers, hospitals, and birth centers.  If you and your baby are healthy enough, your baby may be placed on your chest or abdomen to help maintain the baby's temperature and to help you bond with each other. Some mothers and babies start breastfeeding at this time. Your health care team will dry your baby and help keep your baby warm during this time.  Your baby may need immediate care if he or she: ? Showed signs of distress during labor. ? Has a medical condition. ? Was born too early (prematurely). ? Had a bowel movement before birth (meconium). ? Shows signs of difficulty transitioning from being inside the uterus to being outside of the uterus. If you are planning to breastfeed, your health care team will help you begin a feeding. Stage 3  The third stage of labor starts immediately after the birth of your baby and ends after you deliver the placenta. The placenta is an organ that develops during pregnancy to provide  oxygen and nutrients to your baby in the womb.  Delivering the placenta may require some pushing, and you may have mild contractions. Breastfeeding can stimulate contractions to help you deliver the placenta.  After the placenta is delivered, your uterus should tighten (contract) and become firm. This helps to stop bleeding in your uterus. To help your uterus contract and to control bleeding, your health care provider may: ? Give you medicine by injection, through an IV tube, by mouth, or through your rectum (rectally). ? Massage your abdomen or perform a vaginal exam to remove any blood clots that are left in your uterus. ? Empty your bladder by placing a thin, flexible tube (catheter) into your bladder. ? Encourage you to   breastfeed your baby. After labor is over, you and your baby will be monitored closely to ensure that you are both healthy until you are ready to go home. Your health care team will teach you how to care for yourself and your baby. This information is not intended to replace advice given to you by your health care provider. Make sure you discuss any questions you have with your health care provider. Document Released: 04/18/2008 Document Revised: 01/28/2016 Document Reviewed: 07/25/2015 Elsevier Interactive Patient Education  2018 [employer]  

## 2018-03-08 ENCOUNTER — Inpatient Hospital Stay (HOSPITAL_COMMUNITY): Payer: Medicaid Other | Admitting: Anesthesiology

## 2018-03-08 ENCOUNTER — Other Ambulatory Visit: Payer: Self-pay

## 2018-03-08 ENCOUNTER — Encounter (HOSPITAL_COMMUNITY): Payer: Self-pay

## 2018-03-08 ENCOUNTER — Encounter (HOSPITAL_COMMUNITY): Payer: Self-pay | Admitting: *Deleted

## 2018-03-08 ENCOUNTER — Inpatient Hospital Stay (HOSPITAL_COMMUNITY)
Admission: AD | Admit: 2018-03-08 | Discharge: 2018-03-09 | DRG: 806 | Disposition: A | Discharge status: Home / Self Care | Payer: Medicaid Other | Attending: Obstetrics and Gynecology | Admitting: Obstetrics and Gynecology

## 2018-03-08 ENCOUNTER — Inpatient Hospital Stay (HOSPITAL_COMMUNITY)
Admission: AD | Admit: 2018-03-08 | Discharge: 2018-03-08 | Disposition: A | Discharge status: Home / Self Care | Payer: Medicaid Other | Source: Ambulatory Visit | Attending: Obstetrics & Gynecology | Admitting: Obstetrics & Gynecology

## 2018-03-08 DIAGNOSIS — D696 Thrombocytopenia, unspecified: Secondary | ICD-10-CM | POA: Diagnosis present

## 2018-03-08 DIAGNOSIS — O9912 Other diseases of the blood and blood-forming organs and certain disorders involving the immune mechanism complicating childbirth: Secondary | ICD-10-CM | POA: Diagnosis present

## 2018-03-08 DIAGNOSIS — D649 Anemia, unspecified: Secondary | ICD-10-CM | POA: Diagnosis present

## 2018-03-08 DIAGNOSIS — O9902 Anemia complicating childbirth: Secondary | ICD-10-CM | POA: Diagnosis present

## 2018-03-08 DIAGNOSIS — Z3483 Encounter for supervision of other normal pregnancy, third trimester: Secondary | ICD-10-CM | POA: Diagnosis present

## 2018-03-08 DIAGNOSIS — Z3493 Encounter for supervision of normal pregnancy, unspecified, third trimester: Secondary | ICD-10-CM

## 2018-03-08 DIAGNOSIS — O0933 Supervision of pregnancy with insufficient antenatal care, third trimester: Secondary | ICD-10-CM

## 2018-03-08 DIAGNOSIS — Z3A39 39 weeks gestation of pregnancy: Secondary | ICD-10-CM

## 2018-03-08 DIAGNOSIS — Z87891 Personal history of nicotine dependence: Secondary | ICD-10-CM | POA: Diagnosis not present

## 2018-03-08 LAB — TYPE AND SCREEN
ABO/RH(D): A POS
ANTIBODY SCREEN: NEGATIVE

## 2018-03-08 LAB — CBC
HEMATOCRIT: 34 % — AB (ref 36.0–46.0)
Hemoglobin: 12.1 g/dL (ref 12.0–15.0)
MCH: 32.3 pg (ref 26.0–34.0)
MCHC: 35.6 g/dL (ref 30.0–36.0)
MCV: 90.7 fL (ref 78.0–100.0)
PLATELETS: 127 10*3/uL — AB (ref 150–400)
RBC: 3.75 MIL/uL — AB (ref 3.87–5.11)
RDW: 14.7 % (ref 11.5–15.5)
WBC: 9.6 10*3/uL (ref 4.0–10.5)

## 2018-03-08 MED ORDER — SIMETHICONE 80 MG PO CHEW: 80.0000 mg | CHEWABLE_TABLET | ORAL | Status: DC | PRN

## 2018-03-08 MED ORDER — COCONUT OIL OIL: 1.0000 "application " | TOPICAL_OIL | Status: DC | PRN

## 2018-03-08 MED ORDER — PHENYLEPHRINE 40 MCG/ML (10ML) SYRINGE FOR IV PUSH (FOR BLOOD PRESSURE SUPPORT)
80.0000 ug | PREFILLED_SYRINGE | INTRAVENOUS | Status: DC | PRN
  Filled 2018-03-08: qty 10
  Filled 2018-03-08: qty 5

## 2018-03-08 MED ORDER — EPHEDRINE 5 MG/ML INJ
10.0000 mg | INTRAVENOUS | Status: DC | PRN
  Filled 2018-03-08: qty 2

## 2018-03-08 MED ORDER — OXYCODONE-ACETAMINOPHEN 5-325 MG PO TABS: 2.0000 | ORAL_TABLET | ORAL | Status: DC | PRN

## 2018-03-08 MED ORDER — DIPHENHYDRAMINE HCL 50 MG/ML IJ SOLN: 12.5000 mg | INTRAMUSCULAR | Status: DC | PRN

## 2018-03-08 MED ORDER — FENTANYL CITRATE (PF) 100 MCG/2ML IJ SOLN: 100.0000 ug | INTRAMUSCULAR | Status: DC | PRN

## 2018-03-08 MED ORDER — IBUPROFEN 600 MG PO TABS
600.0000 mg | ORAL_TABLET | Freq: Once | ORAL | Status: AC
  Administered 2018-03-08: 600 mg via ORAL
  Filled 2018-03-08: qty 1

## 2018-03-08 MED ORDER — ONDANSETRON HCL 4 MG PO TABS: 4.0000 mg | ORAL_TABLET | ORAL | Status: DC | PRN

## 2018-03-08 MED ORDER — ONDANSETRON HCL 4 MG/2ML IJ SOLN: 4.0000 mg | Freq: Four times a day (QID) | INTRAMUSCULAR | Status: DC | PRN

## 2018-03-08 MED ORDER — LACTATED RINGERS IV SOLN
500.0000 mL | Freq: Once | INTRAVENOUS | Status: AC
  Administered 2018-03-08: 500 mL via INTRAVENOUS

## 2018-03-08 MED ORDER — IBUPROFEN 600 MG PO TABS
600.0000 mg | ORAL_TABLET | Freq: Four times a day (QID) | ORAL | Status: DC
  Administered 2018-03-08 – 2018-03-09 (×4): 600 mg via ORAL
  Filled 2018-03-08 (×4): qty 1

## 2018-03-08 MED ORDER — LIDOCAINE HCL (PF) 1 % IJ SOLN
30.0000 mL | INTRAMUSCULAR | Status: DC | PRN
  Filled 2018-03-08: qty 30

## 2018-03-08 MED ORDER — LACTATED RINGERS IV SOLN
INTRAVENOUS | Status: DC
  Administered 2018-03-08: 11:00:00 via INTRAVENOUS

## 2018-03-08 MED ORDER — SOD CITRATE-CITRIC ACID 500-334 MG/5ML PO SOLN: 30.0000 mL | ORAL | Status: DC | PRN

## 2018-03-08 MED ORDER — DIBUCAINE 1 % RE OINT: 1.0000 "application " | TOPICAL_OINTMENT | RECTAL | Status: DC | PRN

## 2018-03-08 MED ORDER — LIDOCAINE HCL (PF) 1 % IJ SOLN
INTRAMUSCULAR | Status: DC | PRN
  Administered 2018-03-08 (×2): 4 mL via EPIDURAL

## 2018-03-08 MED ORDER — TETANUS-DIPHTH-ACELL PERTUSSIS 5-2.5-18.5 LF-MCG/0.5 IM SUSP: 0.5000 mL | Freq: Once | INTRAMUSCULAR | Status: DC

## 2018-03-08 MED ORDER — BENZOCAINE-MENTHOL 20-0.5 % EX AERO: 1.0000 "application " | INHALATION_SPRAY | CUTANEOUS | Status: DC | PRN

## 2018-03-08 MED ORDER — FENTANYL 2.5 MCG/ML BUPIVACAINE 1/10 % EPIDURAL INFUSION (WH - ANES)
14.0000 mL/h | INTRAMUSCULAR | Status: DC | PRN
  Administered 2018-03-08: 14 mL/h via EPIDURAL
  Filled 2018-03-08: qty 100

## 2018-03-08 MED ORDER — PHENYLEPHRINE 40 MCG/ML (10ML) SYRINGE FOR IV PUSH (FOR BLOOD PRESSURE SUPPORT)
80.0000 ug | PREFILLED_SYRINGE | INTRAVENOUS | Status: DC | PRN
  Filled 2018-03-08: qty 5

## 2018-03-08 MED ORDER — OXYTOCIN 40 UNITS IN LACTATED RINGERS INFUSION - SIMPLE MED
2.5000 [IU]/h | INTRAVENOUS | Status: DC
  Filled 2018-03-08: qty 1000

## 2018-03-08 MED ORDER — ONDANSETRON HCL 4 MG/2ML IJ SOLN: 4.0000 mg | INTRAMUSCULAR | Status: DC | PRN

## 2018-03-08 MED ORDER — ZOLPIDEM TARTRATE 5 MG PO TABS: 5.0000 mg | ORAL_TABLET | Freq: Every evening | ORAL | Status: DC | PRN

## 2018-03-08 MED ORDER — OXYCODONE-ACETAMINOPHEN 5-325 MG PO TABS: 1.0000 | ORAL_TABLET | ORAL | Status: DC | PRN

## 2018-03-08 MED ORDER — DIPHENHYDRAMINE HCL 25 MG PO CAPS: 25.0000 mg | ORAL_CAPSULE | Freq: Four times a day (QID) | ORAL | Status: DC | PRN

## 2018-03-08 MED ORDER — PRENATAL MULTIVITAMIN CH
1.0000 | ORAL_TABLET | Freq: Every day | ORAL | Status: DC
  Administered 2018-03-09: 1 via ORAL
  Filled 2018-03-08: qty 1

## 2018-03-08 MED ORDER — WITCH HAZEL-GLYCERIN EX PADS: 1.0000 "application " | MEDICATED_PAD | CUTANEOUS | Status: DC | PRN

## 2018-03-08 MED ORDER — ACETAMINOPHEN 325 MG PO TABS
650.0000 mg | ORAL_TABLET | ORAL | Status: DC | PRN
  Administered 2018-03-08 – 2018-03-09 (×4): 650 mg via ORAL
  Filled 2018-03-08 (×4): qty 2

## 2018-03-08 MED ORDER — ACETAMINOPHEN 325 MG PO TABS: 650.0000 mg | ORAL_TABLET | ORAL | Status: DC | PRN

## 2018-03-08 MED ORDER — OXYTOCIN BOLUS FROM INFUSION
500.0000 mL | Freq: Once | INTRAVENOUS | Status: AC
  Administered 2018-03-08: 500 mL via INTRAVENOUS

## 2018-03-08 MED ORDER — LACTATED RINGERS IV SOLN: 500.0000 mL | INTRAVENOUS | Status: DC | PRN

## 2018-03-08 MED ORDER — OXYCODONE HCL 5 MG PO TABS
5.0000 mg | ORAL_TABLET | ORAL | Status: DC | PRN
  Administered 2018-03-08 – 2018-03-09 (×5): 5 mg via ORAL
  Filled 2018-03-08 (×5): qty 1

## 2018-03-08 MED ORDER — SENNOSIDES-DOCUSATE SODIUM 8.6-50 MG PO TABS
2.0000 | ORAL_TABLET | ORAL | Status: DC
  Administered 2018-03-09: 2 via ORAL
  Filled 2018-03-08: qty 2

## 2018-03-08 NOTE — H&P (Addendum)
OBSTETRIC ADMISSION HISTORY AND PHYSICAL  Theresa Finley is a 27 y.o. female [phone] with IUP at 2616w6d by 8 week US presenting for labor. She reports +FMs, No LOF, no VB, no blurry vision, headaches or peripheral edema, and RUQ pain. She is undecided on feeding. Undecided re birth control. [facility]   Dating: By 8 wk US --->  Estimated Date of Delivery: 03/09/18  @[redacted]w[redacted]d , CWD, normal anatomy, incomplete cardiac views, cephalic presentation, 364g, 47% EFW   Prenatal History/Complications:  Past Medical History: Past Medical History:  Diagnosis Date  . Medical history non-contributory     Past Surgical History: Past Surgical History:  Procedure Laterality Date  . ORIF RADIAL FRACTURE  07/27/2012   Procedure: OPEN REDUCTION INTERNAL FIXATION (ORIF) RADIAL FRACTURE;  Surgeon: Theresa SeverinWilliam Gramig, MD;  Location: [facility] OR;  Service: Orthopedics;  Laterality: Right;  ORIF Right Radial Finley Fracture    Obstetrical History: OB History    Gravida  4   Para  2   Term  2   Preterm  0   AB  1   Living  2     SAB  1   TAB  0   Ectopic  0   Multiple  0   Live Births  2           Social History: Social History   Socioeconomic History  . Marital status: Single    Spouse name: Not on file  . Number of children: Not on file  . Years of education: Not on file  . Highest education level: Not on file  Occupational History  . Not on file  Social Needs  . Financial resource strain: Not on file  . Food insecurity:    Worry: Not on file    Inability: Not on file  . Transportation needs:    Medical: Not on file    Non-medical: Not on file  Tobacco Use  . Smoking status: Former Smoker    Last attempt to quit: 02/18/2013    Years since quitting: 5.0  . Smokeless tobacco: Never Used  . Tobacco comment: Patient reports that she rarly smokes  Substance and Sexual Activity  . Alcohol use: No  . Drug use: Yes    Types: Marijuana    Comment: last use  of weed 02/16/2018  . Sexual activity: Yes    Birth control/protection: None  Lifestyle  . Physical activity:    Days per week: Not on file    Minutes per session: Not on file  . Stress: Not on file  Relationships  . Social connections:    Talks on phone: Not on file    Gets together: Not on file    Attends religious service: Not on file    Active member of club or organization: Not on file    Attends meetings of clubs or organizations: Not on file    Relationship status: Not on file  Other Topics Concern  . Not on file  Social History Narrative  . Not on file    Family History: Family History  Problem Relation Age of Onset  . Cancer Mother     Allergies: No Known Allergies  Medications Prior to Admission  Medication Sig Dispense Refill Last Dose  . cyclobenzaprine (FLEXERIL) 5 MG tablet Take 1 tablet (5 mg total) by mouth 3 (three) times daily as needed for muscle spasms. 15 tablet 0 03/07/2018 at Unknown time  . Elastic Bandages & Supports  MISC 1 Units by Does not apply route as needed. 1 each 0   . ondansetron (ZOFRAN ODT) 4 MG disintegrating tablet Take 1 tablet (4 mg total) by mouth every 8 (eight) hours as needed for nausea or vomiting. 20 tablet 0 02/17/2018 at Unknown time  . Prenatal Vit-Fe Fumarate-FA (MULTIVITAMIN-PRENATAL) 27-0.8 MG TABS tablet Take 1 tablet by mouth daily at 12 noon.   03/07/2018 at Unknown time     Review of Systems   All systems reviewed and negative except as stated in HPI  Last menstrual period 04/23/2017. General appearance: alert, cooperative and on hands and knees moaning Lungs: clear to auscultation bilaterally Heart: regular rate and rhythm Abdomen: soft, non-tender; bowel sounds normal Pelvic: deferred Extremities: Homans sign is negative, no sign of DVT Presentation: cephalic Fetal monitoringBaseline: 120 bpm, Variability: Good {> 6 bpm), Accelerations: Reactive and Decelerations: Absent Uterine activityFrequency: Every 5  minutes and Duration: 30 seconds     Prenatal labs: ABO, Rh: A/Positive/-- (04/01 1012) Antibody: Negative (04/01 1012) Rubella: 2.04 (04/01 1012) RPR: Non Reactive (04/01 1012)  HBsAg: Negative (04/01 1012)  HIV: Non Reactive (04/01 1012)  GBS:    1 hr Glucola not done Genetic screening  Not done   Prenatal Transfer Tool  Maternal Diabetes: No Genetic Screening: Declined Maternal Ultrasounds/Referrals: Abnormal:  Findings:   Other:incomplete cardiac views Fetal Ultrasounds or other Referrals:  None Maternal Substance Abuse:  No Significant Maternal Medications:  None Significant Maternal Lab Results: Lab values include: Other: thrombocytopenia, anemia; GBS unknown  No results found for this or any previous visit (from the past 24 hour(s)).  Patient Active Problem List   Diagnosis Date Noted  . Labor and delivery, indication for care 03/08/2018  . Limited prenatal care in third trimester 02/13/2018  . Trichomonas infection 01/30/2018  . Supervision of low-risk pregnancy 10/22/2017    Assessment/Plan:  Theresa Finley is a 27 y.o. [id number] at 4074w6d here for labor. She presented 8 cm dilated with pressure.    #Labor:in active labor, progressing well, manage expectantly #Pain: Awaiting epidural #FWB: Cat 1 #ID:  GBS unknown, unable to obtain PCR prior to delivery #MOF: bottle #MOC:nexplanon #Circ:  N/a  Limited prenatal care Two prenatal visits (21wks, 34 wks) -consult SW  Mirian MoPeter Frank, MD  03/08/2018, 11:01 AM  CNM attestation:  I have seen and examined this patient; I agree with above documentation in the resident's note.   Theresa Finley is a 27 y.o. [id number] here for SOL; prenatal care x 2 visits  PE: BP 124/66 (BP Location: Right Arm)   Pulse 81   Temp 98.7 F (37.1 C) (Oral)   Resp 16   LMP 04/23/2017 (Approximate)   SpO2 100%   Breastfeeding? Unknown   Resp: normal effort, no distress Abd: gravid  ROS, labs, PMH reviewed  [facility]: [facility] Expectant management Unable to get GBS prior to delivery Anticipate [facility] Plan SW consult pp for limited care  Cam HaiSHAW, Marise Knapper CNM 03/08/2018, 3:07 PM

## 2018-03-08 NOTE — Anesthesia Preprocedure Evaluation (Signed)
Anesthesia Evaluation  Patient identified by MRN, date of birth, ID band Patient awake    Reviewed: Allergy & Precautions, NPO status , Patient's Chart, lab work & pertinent test results  Airway Mallampati: II  TM Distance: >3 FB Neck ROM: Full    Dental no notable dental hx. (+) Dental Advisory Given, Teeth Intact   Pulmonary neg pulmonary ROS, former smoker,    Pulmonary exam normal breath sounds clear to auscultation       Cardiovascular negative cardio ROS Normal cardiovascular exam Rhythm:Regular Rate:Normal     Neuro/Psych negative neurological ROS  negative psych ROS   GI/Hepatic negative GI ROS, Neg liver ROS,   Endo/Other  negative endocrine ROS  Renal/GU negative Renal ROS  negative genitourinary   Musculoskeletal negative musculoskeletal ROS (+)   Abdominal   Peds  Hematology negative hematology ROS (+)   Anesthesia Other Findings   Reproductive/Obstetrics (+) Pregnancy                             Anesthesia Physical Anesthesia Plan  ASA: II  Anesthesia Plan: Epidural   Post-op Pain Management:    Induction:   PONV Risk Score and Plan: Treatment may vary due to age or medical condition  Airway Management Planned: Natural Airway  Additional Equipment:   Intra-op Plan:   Post-operative Plan:   Informed Consent: I have reviewed the patients History and Physical, chart, labs and discussed the procedure including the risks, benefits and alternatives for the proposed anesthesia with the patient or authorized representative who has indicated his/her understanding and acceptance.   Dental advisory given  Plan Discussed with: Anesthesiologist  Anesthesia Plan Comments: (Patient identified. Risks, benefits, options discussed with patient including but not limited to bleeding, infection, nerve damage, paralysis, failed block, incomplete pain control, headache, blood  pressure changes, nausea, vomiting, reactions to medication, itching, and post partum back pain. Confirmed with bedside nurse the patient's most recent platelet count. Confirmed with the patient that they are not taking any anticoagulation, have any bleeding history or any family history of bleeding disorders. Patient expressed understanding and wishes to proceed. All questions were answered. )        Anesthesia Quick Evaluation

## 2018-03-08 NOTE — Progress Notes (Signed)
Pt on hands and knees, moving around. Pt removed EFM. Explained why the need to monitor. Pt verbalized that she understood however uncomfortable.

## 2018-03-08 NOTE — Discharge Instructions (Signed)
Braxton Hicks Contractions °Contractions of the uterus can occur throughout pregnancy, but they are not always a sign that you are in labor. You may have practice contractions called Braxton Hicks contractions. These false labor contractions are sometimes confused with true labor. °What are Braxton Hicks contractions? °Braxton Hicks contractions are tightening movements that occur in the muscles of the uterus before labor. Unlike true labor contractions, these contractions do not result in opening (dilation) and thinning of the cervix. Toward the end of pregnancy (32-34 weeks), Braxton Hicks contractions can happen more often and may become stronger. These contractions are sometimes difficult to tell apart from true labor because they can be very uncomfortable. You should not feel embarrassed if you go to the hospital with false labor. °Sometimes, the only way to tell if you are in true labor is for your health care provider to look for changes in the cervix. The health care provider will do a physical exam and may monitor your contractions. If you are not in true labor, the exam should show that your cervix is not dilating and your water has not broken. °If there are other health problems associated with your pregnancy, it is completely safe for you to be sent home with false labor. You may continue to have Braxton Hicks contractions until you go into true labor. °How to tell the difference between true labor and false labor °True labor °· Contractions last 30-70 seconds. °· Contractions become very regular. °· Discomfort is usually felt in the top of the uterus, and it spreads to the lower abdomen and low back. °· Contractions do not go away with walking. °· Contractions usually become more intense and increase in frequency. °· The cervix dilates and gets thinner. °False labor °· Contractions are usually shorter and not as strong as true labor contractions. °· Contractions are usually irregular. °· Contractions  are often felt in the front of the lower abdomen and in the groin. °· Contractions may go away when you walk around or change positions while lying down. °· Contractions get weaker and are shorter-lasting as time goes on. °· The cervix usually does not dilate or become thin. °Follow these instructions at home: °· Take over-the-counter and prescription medicines only as told by your health care provider. °· Keep up with your usual exercises and follow other instructions from your health care provider. °· Eat and drink lightly if you think you are going into labor. °· If Braxton Hicks contractions are making you uncomfortable: °? Change your position from lying down or resting to walking, or change from walking to resting. °? Sit and rest in a tub of warm water. °? Drink enough fluid to keep your urine pale yellow. Dehydration may cause these contractions. °? Do slow and deep breathing several times an hour. °· Keep all follow-up prenatal visits as told by your health care provider. This is important. °Contact a health care provider if: °· You have a fever. °· You have continuous pain in your abdomen. °Get help right away if: °· Your contractions become stronger, more regular, and closer together. °· You have fluid leaking or gushing from your vagina. °· You pass blood-tinged mucus (bloody show). °· You have bleeding from your vagina. °· You have low back pain that you never had before. °· You feel your baby’s head pushing down and causing pelvic pressure. °· Your baby is not moving inside you as much as it used to. °Summary °· Contractions that occur before labor are called Braxton   Hicks contractions, false labor, or practice contractions. °· Braxton Hicks contractions are usually shorter, weaker, farther apart, and less regular than true labor contractions. True labor contractions usually become progressively stronger and regular and they become more frequent. °· Manage discomfort from Braxton Hicks contractions by  changing position, resting in a warm bath, drinking plenty of water, or practicing deep breathing. °This information is not intended to replace advice given to you by your health care provider. Make sure you discuss any questions you have with your health care provider. °Document Released: 11/23/2016 Document Revised: 11/23/2016 Document Reviewed: 11/23/2016 °Elsevier Interactive Patient Education © 2018 [employer] Inc[employer] ° °

## 2018-03-08 NOTE — Progress Notes (Signed)
Pt admitted to L&D, refusing EFM and toco. Pt threw wireless FM on the floor. Explained to patient that we would like to monitor baby to assess the babys wellbeing. Pt refuses to respond to any questions or request. Husband and Pt mother supportive and encouraging patient. Pt needing IV, moving arms all around. Explained the process of getting an epidural that she requested, needed IV, blood work results and cooperation.

## 2018-03-08 NOTE — Anesthesia Procedure Notes (Signed)
Epidural Patient location during procedure: OB Start time: 03/08/2018 11:35 AM End time: 03/08/2018 11:55 AM  Staffing Anesthesiologist: Elmer PickerWoodrum, Nechama Escutia L, MD Performed: anesthesiologist   Preanesthetic Checklist Completed: patient identified, pre-op evaluation, timeout performed, IV checked, risks and benefits discussed and monitors and equipment checked  Epidural Patient position: sitting Prep: site prepped and draped and DuraPrep Patient monitoring: continuous pulse ox, blood pressure, heart rate and cardiac monitor Approach: midline Location: L3-L4 Injection technique: LOR air  Needle:  Needle type: Tuohy  Needle gauge: 17 G Needle length: 9 cm Needle insertion depth: 5 cm Catheter type: closed end flexible Catheter size: 19 Gauge Catheter at skin depth: 10 cm Test dose: negative  Assessment Sensory level: T8 Events: blood not aspirated, injection not painful, no injection resistance, negative IV test and no paresthesia  Additional Notes Reason for block:procedure for pain

## 2018-03-08 NOTE — MAU Note (Signed)
Pt reports contractions every 4-5 minutes. Pt denies LOF or vaginal bleeding. Reports good fetal movement. Pt states cervix was checked 3 weeks ago and she was 3.5cm.

## 2018-03-08 NOTE — MAU Note (Signed)
Pt short when entered rm, "I have been her before, you all know that".  Explained I had not seen her and was unaware, took her to the room, instructed pt to change into gown and I would be back in to hook her up and assess.  When returned to rm, asked pt if she was ready, pt stated " you know I am ready, I am done with you I want another nurse, you are making me hurt worse"

## 2018-03-09 LAB — RPR: RPR Ser Ql: NONREACTIVE

## 2018-03-09 MED ORDER — PRENATAL MULTIVITAMIN CH: 1.0000 | ORAL_TABLET | Freq: Every day | ORAL | Status: DC

## 2018-03-09 MED ORDER — WITCH HAZEL-GLYCERIN EX PADS: 1.0000 "application " | MEDICATED_PAD | CUTANEOUS | 12 refills | Status: DC | PRN

## 2018-03-09 MED ORDER — ACETAMINOPHEN 325 MG PO TABS: 650.0000 mg | ORAL_TABLET | ORAL | Status: DC | PRN

## 2018-03-09 MED ORDER — IBUPROFEN 600 MG PO TABS: 600.0000 mg | ORAL_TABLET | Freq: Four times a day (QID) | ORAL | 0 refills | Status: DC

## 2018-03-09 NOTE — Progress Notes (Signed)
Post Partum Day 1 Subjective: Patient is doing well with no complaints. She denies headache, changes in vision, RUQ pain, chest pain, SOB, or excessive LE swelling. She has not passed flatus. Endorses urinating normally. She is both breast and bottle feeding. She plans for nexplanon for birth control.   Objective: Blood pressure 110/75, pulse 62, temperature (!) 97.4 F (36.3 C), temperature source Oral, resp. rate 18, height 5\' 4"  (1.626 m), weight 64.9 kg, last menstrual period 04/23/2017, SpO2 100 %, unknown if currently breastfeeding.  Physical Exam:  General: alert and no distress Lochia: appropriate Uterine Fundus: firm Incision: n/a DVT Evaluation: No evidence of DVT seen on physical exam.  Recent Labs    03/08/18 1107  HGB 12.1  HCT 34.0*    Assessment/Plan: Theresa Finley is a 27 year-old [id number] who is PPD #1 from [facility]. She is in good condition. Plan for social work consult. Plan for d/c tomorrow.   LOS: 1 day   Natasha MeadBryan A Calley Drenning, MS3 03/09/2018, 7:27 AM

## 2018-03-09 NOTE — Anesthesia Postprocedure Evaluation (Signed)
Anesthesia Post Note  Patient: Theresa Finley  Procedure(s) Performed: AN AD HOC LABOR EPIDURAL     Patient location during evaluation: Mother Baby Anesthesia Type: Epidural Level of consciousness: awake and alert Pain management: pain level controlled Vital Signs Assessment: post-procedure vital signs reviewed and stable Respiratory status: spontaneous breathing, nonlabored ventilation and respiratory function stable Cardiovascular status: stable Postop Assessment: no headache, no backache, epidural receding, [place] to ambulate, adequate PO intake, no apparent nausea or vomiting and patient Powe to bend at knees Anesthetic complications: no    Last Vitals:  Vitals:   03/09/18 0005 03/09/18 0605  BP: 116/72 110/75  Pulse: (!) 59 62  Resp: 20 18  Temp: 36.7 C (!) 36.3 C  SpO2:      Last Pain:  Vitals:   03/09/18 0605  TempSrc: Oral  PainSc: 0-No pain   Pain Goal: Patients Stated Pain Goal: 2 (03/09/18 0412)               Laban EmperorMalinova,Deborah Lazcano Hristova

## 2018-03-09 NOTE — Clinical Social Work Maternal (Signed)
CLINICAL SOCIAL WORK MATERNAL/CHILD NOTE  Patient Details  Name: Theresa LinemanShinequa L Dimitrov MRN: [id number] Date of Birth: 01/20/1991  Date:  03/09/2018  Clinical Social Worker Initiating Note:  Edwin Dadaarol Sencere Symonette, MSW, LCSW-A Date/Time: Initiated:  03/09/18/1522     Child's Name:      Biological Parents:  Mother, Father   Need for Interpreter:  None   Reason for Referral:  Current Substance Use/Substance Use During Pregnancy    Address:  [address] [place] [address]    Phone number:  [phone] (home)     Additional phone number:   Household Members/Support Persons (HM/SP):       HM/SP Name Relationship DOB or Age  HM/SP -1        HM/SP -2        HM/SP -3        HM/SP -4        HM/SP -5        HM/SP -6        HM/SP -7        HM/SP -8          Natural Supports (not living in the home):      Professional Supports:     Employment:     Type of Work:     Education:      Homebound arranged:    Architectinancial Resources:  Medicaid   Other Resources:  [employer]IC, Sales executiveood Stamps    Cultural/Religious Considerations Which May Impact Care:  Non-demoninational  Strengths:  Ability to meet basic needs , Home prepared for child , Pediatrician chosen   Psychotropic Medications:         [place]  [place] List:   [facility]  [place]    [place] [facility]    [place]    [place]    [place]      Pediatrician Fax Number:    Risk Factors/Current Problems:      Cognitive State:  Alert    Mood/Affect:  Angry , Agitated    CSW Assessment: CSW received consult for MOB due to limited prenatal care (two visits) and history of marijuana use. CSW obtained permission from MOB to discuss with FOB and younger children present, FOB remained completely silent throughout assessment. CSW immediately noticed a shift in MOB's demeanor whenever CSW mentioned marijuana use during pregnancy. MOB and  CSW discussed her use, MOB reports smoking marijuana on a frequent basis and that she smoked with all of her pregnancies because that is "her medication." CSW educated MOB on hospital drug screening policies in regards to her limited prenatal care. MOB did not have questions about policies. MOB denies any CPS involvement in the past. CSW inquired with MOB about cotton balls in baby's diaper, MOB laughed and continued to watch TV. MOB informed CSW that the baby would test positive for marijuana. MOB reports having a car seat and bassinet for newborn. MOB [facility]. MOB provided very brief, one word responses to all CSW questions. CSW was unable to obtain the FOB or newborn's name due to difficulty with assessment. CSW encouraged MOB to reach out for assistance if needs arise, her response was "bye bye."  CSW spoke with Barnetta ChapelLauren Rafeek, FNP regarding discharge for family. No barriers to discharge at this time. CSW will continue to monitor chart for UDS and cord results and will make report if warranted.  CSW Plan/Description:  CSW Will Continue to Monitor Umbilical Cord Tissue Drug Screen Results and Make Report if [facility], [facility] Drug Screen Policy Information    Patrecia PaceCarol L Mickala Laton, LCSWA 03/09/2018, 3:28 PM

## 2018-03-09 NOTE — Discharge Instructions (Signed)
Postpartum Care After Vaginal Delivery °The period of time right after you deliver your newborn is called the postpartum period. °What kind of medical care will I receive? °· You may continue to receive fluids and medicines through an IV tube inserted into one of your veins. °· If an incision was made near your vagina (episiotomy) or if you had some vaginal tearing during delivery, cold compresses may be placed on your episiotomy or your tear. This helps to reduce pain and swelling. °· You may be given a squirt bottle to use when you go to the bathroom. You may use this until you are comfortable wiping as usual. To use the squirt bottle, follow these steps: °? Before you urinate, fill the squirt bottle with warm water. Do not use hot water. °? After you urinate, while you are sitting on the toilet, use the squirt bottle to rinse the area around your urethra and vaginal opening. This rinses away any urine and blood. °? You may do this instead of wiping. As you start healing, you may use the squirt bottle before wiping yourself. Make sure to wipe gently. °? Fill the squirt bottle with clean water every time you use the bathroom. °· You will be given sanitary pads to wear. °How can I expect to feel? °· You may not feel the need to urinate for several hours after delivery. °· You will have some soreness and pain in your abdomen and vagina. °· If you are breastfeeding, you may have uterine contractions every time you breastfeed for up to several weeks postpartum. Uterine contractions help your uterus return to its normal size. °· It is normal to have vaginal bleeding (lochia) after delivery. The amount and appearance of lochia is often similar to a menstrual period in the first week after delivery. It will gradually decrease over the next few weeks to a dry, yellow-brown discharge. For most women, lochia stops completely by 6-8 weeks after delivery. Vaginal bleeding can vary from woman to woman. °· Within the first few  days after delivery, you may have breast engorgement. This is when your breasts feel heavy, full, and uncomfortable. Your breasts may also throb and feel hard, tightly stretched, warm, and tender. After this occurs, you may have milk leaking from your breasts. Your health care provider can help you relieve discomfort due to breast engorgement. Breast engorgement should go away within a few days. °· You may feel more sad or worried than normal due to hormonal changes after delivery. These feelings should not last more than a few days. If these feelings do not go away after several days, speak with your health care provider. °How should I care for myself? °· Tell your health care provider if you have pain or discomfort. °· Drink enough water to keep your urine clear or pale yellow. °· Wash your hands thoroughly with soap and water for at least 20 seconds after changing your sanitary pads, after using the toilet, and before holding or feeding your baby. °· If you are not breastfeeding, avoid touching your breasts a lot. Doing this can make your breasts produce more milk. °· If you become weak or lightheaded, or you feel like you might faint, ask for help before: °? Getting out of bed. °? Showering. °· Change your sanitary pads frequently. Watch for any changes in your flow, such as a sudden increase in volume, a change in color, the passing of large blood clots. If you pass a blood clot from your vagina, save it   to show to your health care provider. Do not flush blood clots down the toilet without having your health care provider look at them. °· Make sure that all your vaccinations are up to date. This can help protect you and your baby from getting certain diseases. You may need to have immunizations done before you leave the hospital. °· If desired, talk with your health care provider about methods of family planning or birth control (contraception). °How can I start bonding with my baby? °Spending as much time as  possible with your baby is very important. During this time, you and your baby can get to know each other and develop a bond. Having your baby stay with you in your room (rooming in) can give you time to get to know your baby. Rooming in can also help you become comfortable caring for your baby. Breastfeeding can also help you bond with your baby. °How can I plan for returning home with my baby? °· Make sure that you have a car seat installed in your vehicle. °? Your car seat should be checked by a certified car seat installer to make sure that it is installed safely. °? Make sure that your baby fits into the car seat safely. °· Ask your health care provider any questions you have about caring for yourself or your baby. Make sure that you are [place] to contact your health care provider with any questions after leaving the hospital. °This information is not intended to replace advice given to you by your health care provider. Make sure you discuss any questions you have with your health care provider. °Document Released: 05/07/2007 Document Revised: 12/13/2015 Document Reviewed: 06/14/2015 °Elsevier Interactive Patient Education © 2018 Elsevier Inc. ° °

## 2018-03-09 NOTE — Discharge Summary (Signed)
OB Discharge Summary     Patient Name: Theresa LinemanShinequa L Monjaras DOB: 03/10/1991 MRN: [id number]  Date of admission: 03/08/2018 Delivering MD: Cam HaiSHAW, KIMBERLY D   Date of discharge: 03/09/2018  Admitting diagnosis: labor Intrauterine pregnancy: 5841w6d     Secondary diagnosis:  Active Problems:   Labor and delivery, indication for care  Additional problems: N/A     Discharge diagnosis: Term Pregnancy Delivered                                                                                                Post partum procedures:N/A  Augmentation: None  Complications: None  Hospital course:  Onset of Labor With Vaginal Delivery     27 y.o. yo [id number] at 5141w6d was admitted in Active Labor on 03/08/2018. Patient had an uncomplicated labor course as follows:  Membrane Rupture Time/Date: 12:20 PM ,03/08/2018   Intrapartum Procedures: Episiotomy: None [1]                                         Lacerations:  None [1]  Patient had a delivery of a Viable infant. 03/08/2018  Information for the patient's newborn:  Theresa Finley, Theresa Finley [[id number]][[id number]]  Delivery Method: Vag-Spont    Pateint had an uncomplicated postpartum course.  She is ambulating, tolerating a regular diet, passing flatus, and urinating well. Patient is discharged home in stable condition on 03/09/18.   Physical exam  Vitals:   03/08/18 1952 03/09/18 0005 03/09/18 0605 03/09/18 1426  BP: 119/81 116/72 110/75 109/72  Pulse: 63 (!) 59 62 64  Resp: 18 20 18    Temp: 98 F (36.7 C) 98.1 F (36.7 C) (!) 97.4 F (36.3 C) 98.1 F (36.7 C)  TempSrc: Oral Oral Oral Oral  SpO2:    100%  Weight:      Height:       General: alert, cooperative and no distress Lochia: appropriate Uterine Fundus: firm Incision: N/A DVT Evaluation: No evidence of DVT seen on physical exam. Labs: Lab Results  Component Value Date   WBC 9.6 03/08/2018   HGB 12.1 03/08/2018   HCT 34.0 (L) 03/08/2018   MCV 90.7 03/08/2018   PLT 127 (L) 03/08/2018    CMP Latest Ref Rng & Units 01/05/2017  Glucose 65 - 99 mg/dL 89  BUN 6 - 20 mg/dL 7  Creatinine 2.440.44 - 0.101.00 mg/dL 2.720.90  Sodium 536135 - 644145 mmol/L 136  Potassium 3.5 - 5.1 mmol/L 5.5(H)  Chloride 101 - 111 mmol/L 105  CO2 19 - 32 mEq/L -  Calcium 8.4 - 10.5 mg/dL -  Total Protein 6.0 - 8.3 g/dL -  Total Bilirubin 0.3 - 1.2 mg/dL -  Alkaline Phos 39 - 034117 U/L -  AST 0 - 37 U/L -  ALT 0 - 35 U/L -    Discharge instruction: per After Visit Summary and "Baby and Me Booklet".  After visit meds:  Allergies as of 03/09/2018   No Known Allergies     Medication List  STOP taking these medications   cyclobenzaprine 5 MG tablet Commonly known as:  FLEXERIL   Elastic Bandages & Supports Misc   ondansetron 4 MG disintegrating tablet Commonly known as:  ZOFRAN-ODT     TAKE these medications   acetaminophen 325 MG tablet Commonly known as:  TYLENOL Take 2 tablets (650 mg total) by mouth every 4 (four) hours as needed (for pain scale < 4).   ibuprofen 600 MG tablet Commonly known as:  ADVIL,MOTRIN Take 1 tablet (600 mg total) by mouth every 6 (six) hours.   prenatal multivitamin Tabs tablet Take 1 tablet by mouth daily at 12 noon. Start taking on:  03/10/2018   witch hazel-glycerin pad Commonly known as:  TUCKS Apply 1 application topically as needed for hemorrhoids.       Diet: routine diet  Activity: Advance as tolerated. Pelvic rest for 6 weeks.   Outpatient follow up:4 weeks Follow up Appt: Future Appointments  Date Time Provider Department Center  03/11/2018  2:35 PM Armando ReichertHogan, Heather D, CNM [facility]   Follow up Visit:No follow-ups on file.  Postpartum contraception: Nexplanon  Newborn Data: Live born female  Birth Weight: 6 lb 9 oz (2977 g) APGAR: 9, 9  Newborn Delivery   Birth date/time:  03/08/2018 12:23:00 Delivery type:  Vaginal, Spontaneous     Baby Feeding: Bottle and Breast Disposition:home with mother   03/09/2018 Calvert CantorSamantha C Weinhold,  CNM

## 2018-03-11 ENCOUNTER — Encounter: Payer: Medicaid Other | Admitting: Advanced Practice Midwife

## 2018-04-17 ENCOUNTER — Ambulatory Visit: Payer: Medicaid Other | Admitting: Family Medicine

## 2018-09-12 ENCOUNTER — Emergency Department (HOSPITAL_COMMUNITY): Payer: Medicaid Other

## 2018-09-12 ENCOUNTER — Emergency Department (HOSPITAL_COMMUNITY)
Admission: EM | Admit: 2018-09-12 | Discharge: 2018-09-12 | Disposition: A | Discharge status: Home / Self Care | Payer: Medicaid Other | Attending: Emergency Medicine | Admitting: Emergency Medicine

## 2018-09-12 ENCOUNTER — Encounter (HOSPITAL_COMMUNITY): Payer: Self-pay | Admitting: *Deleted

## 2018-09-12 ENCOUNTER — Other Ambulatory Visit: Payer: Self-pay

## 2018-09-12 DIAGNOSIS — Y9389 Activity, other specified: Secondary | ICD-10-CM | POA: Insufficient documentation

## 2018-09-12 DIAGNOSIS — W19XXXA Unspecified fall, initial encounter: Secondary | ICD-10-CM | POA: Diagnosis not present

## 2018-09-12 DIAGNOSIS — Z79899 Other long term (current) drug therapy: Secondary | ICD-10-CM | POA: Insufficient documentation

## 2018-09-12 DIAGNOSIS — H9203 Otalgia, bilateral: Secondary | ICD-10-CM | POA: Diagnosis not present

## 2018-09-12 DIAGNOSIS — M25511 Pain in right shoulder: Secondary | ICD-10-CM

## 2018-09-12 DIAGNOSIS — Z87891 Personal history of nicotine dependence: Secondary | ICD-10-CM | POA: Insufficient documentation

## 2018-09-12 DIAGNOSIS — Y929 Unspecified place or not applicable: Secondary | ICD-10-CM | POA: Diagnosis not present

## 2018-09-12 DIAGNOSIS — M25421 Effusion, right elbow: Secondary | ICD-10-CM | POA: Insufficient documentation

## 2018-09-12 DIAGNOSIS — F1210 Cannabis abuse, uncomplicated: Secondary | ICD-10-CM | POA: Insufficient documentation

## 2018-09-12 DIAGNOSIS — R52 Pain, unspecified: Secondary | ICD-10-CM

## 2018-09-12 DIAGNOSIS — Y998 Other external cause status: Secondary | ICD-10-CM | POA: Diagnosis not present

## 2018-09-12 DIAGNOSIS — M25521 Pain in right elbow: Secondary | ICD-10-CM | POA: Diagnosis present

## 2018-09-12 MED ORDER — HYDROCODONE-ACETAMINOPHEN 5-325 MG PO TABS
1.0000 | ORAL_TABLET | Freq: Once | ORAL | Status: AC
  Administered 2018-09-12: 1 via ORAL
  Filled 2018-09-12: qty 1

## 2018-09-12 MED ORDER — HYDROCODONE-ACETAMINOPHEN 5-325 MG PO TABS: 1.0000 | ORAL_TABLET | Freq: Four times a day (QID) | ORAL | 0 refills | Status: DC | PRN

## 2018-09-12 NOTE — ED Notes (Signed)
Pt reported to [employer] that she is hurting in her R elbow not her R shoulder.  But states her whole R arm is in pain.

## 2018-09-12 NOTE — Discharge Instructions (Addendum)
Evaluated today for right elbow pain.  Your x-ray did show dislodgment of a portion of your prosthetic hardware.  You will need to follow-up with orthopedics.  Please call the number on your discharge paperwork to schedule an appointment.  I have given you pain medication.  Please take as prescribed.  Please do not drive or operate heavy machinery while taking this medication.  Return to the ED for any worsening symptoms.
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
## 2018-09-13 ENCOUNTER — Emergency Department (HOSPITAL_COMMUNITY)
Admission: EM | Admit: 2018-09-13 | Discharge: 2018-09-13 | Disposition: A | Discharge status: Home / Self Care | Payer: Medicaid Other | Attending: Emergency Medicine | Admitting: Emergency Medicine

## 2018-09-13 DIAGNOSIS — Z87891 Personal history of nicotine dependence: Secondary | ICD-10-CM | POA: Insufficient documentation

## 2018-09-13 DIAGNOSIS — M25421 Effusion, right elbow: Secondary | ICD-10-CM | POA: Insufficient documentation

## 2018-09-13 DIAGNOSIS — M79601 Pain in right arm: Secondary | ICD-10-CM

## 2018-09-13 DIAGNOSIS — Z96621 Presence of right artificial elbow joint: Secondary | ICD-10-CM | POA: Diagnosis not present

## 2018-09-13 MED ORDER — HYDROMORPHONE HCL 1 MG/ML IJ SOLN
1.0000 mg | Freq: Once | INTRAMUSCULAR | Status: AC
  Administered 2018-09-13: 1 mg via INTRAMUSCULAR
  Filled 2018-09-13: qty 1

## 2018-09-13 MED ORDER — KETOROLAC TROMETHAMINE 60 MG/2ML IM SOLN
30.0000 mg | Freq: Once | INTRAMUSCULAR | Status: AC
  Administered 2018-09-13: 30 mg via INTRAMUSCULAR
  Filled 2018-09-13: qty 2

## 2018-09-13 NOTE — ED Triage Notes (Signed)
Pt was seen earlier this evening and she's still in pain, she got her medications and took them with no relief, she also says that her sling may be too small because her hand in dangling out
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
## 2018-09-13 NOTE — ED Notes (Signed)
Patient stated she had a ride home, verbalized understanding of medication SE like drowsiness.

## 2018-09-13 NOTE — Discharge Instructions (Addendum)
Thank you for allowing me to care for you today in the Emergency Department.   Do not take any Norco for the next few hours.  You can also take 600 mg of ibuprofen with food every 6 hours in addition to Norco for pain control.  Each tablet of Norco contains 325 mg of Tylenol.  You can also take 650 mg of Tylenol every 6 hours, but you should not take more than 4000 mg of Tylenol from all sources in a 24-hour period.  Keep the sling on as directed.  Apply an ice pack for 15 to 20 minutes as frequently as needed to help with pain and swelling.  Return to the emergency department if your fingers turn blue, if you have any fall or injury, if your entire arm becomes swollen and red to the touch, or other new, concerning symptoms.

## 2018-10-10 ENCOUNTER — Encounter (HOSPITAL_BASED_OUTPATIENT_CLINIC_OR_DEPARTMENT_OTHER): Payer: Self-pay | Admitting: *Deleted

## 2018-10-10 ENCOUNTER — Other Ambulatory Visit: Payer: Self-pay

## 2018-10-14 NOTE — Anesthesia Preprocedure Evaluation (Addendum)
Anesthesia Evaluation  Patient identified by MRN, date of birth, ID band Patient awake    Reviewed: Allergy & Precautions, NPO status , Patient's Chart, lab work & pertinent test results  Airway Mallampati: II  TM Distance: >3 FB Neck ROM: Full    Dental no notable dental hx. (+) Teeth Intact, Dental Advisory Given   Pulmonary neg pulmonary ROS, former smoker,    Pulmonary exam normal breath sounds clear to auscultation       Cardiovascular negative cardio ROS Normal cardiovascular exam Rhythm:Regular Rate:Normal     Neuro/Psych negative neurological ROS  negative psych ROS   GI/Hepatic negative GI ROS, Neg liver ROS,   Endo/Other  negative endocrine ROS  Renal/GU negative Renal ROS  negative genitourinary   Musculoskeletal negative musculoskeletal ROS (+)   Abdominal   Peds  Hematology negative hematology ROS (+)   Anesthesia Other Findings   Reproductive/Obstetrics                            Anesthesia Physical Anesthesia Plan  ASA: I  Anesthesia Plan: Regional and MAC   Post-op Pain Management:  Regional for Post-op pain   Induction: Intravenous  PONV Risk Score and Plan: 2 and Midazolam, Ondansetron, Dexamethasone and Treatment may vary due to age or medical condition  Airway Management Planned: Natural Airway and Simple Face Mask  Additional Equipment:   Intra-op Plan:   Post-operative Plan:   Informed Consent: I have reviewed the patients History and Physical, chart, labs and discussed the procedure including the risks, benefits and alternatives for the proposed anesthesia with the patient or authorized representative who has indicated his/her understanding and acceptance.     Dental advisory given  Plan Discussed with: CRNA  Anesthesia Plan Comments:       Anesthesia Quick Evaluation

## 2018-10-15 ENCOUNTER — Encounter (HOSPITAL_BASED_OUTPATIENT_CLINIC_OR_DEPARTMENT_OTHER): Payer: Self-pay | Admitting: Anesthesiology

## 2018-10-15 ENCOUNTER — Ambulatory Visit (HOSPITAL_BASED_OUTPATIENT_CLINIC_OR_DEPARTMENT_OTHER)
Admission: RE | Admit: 2018-10-15 | Discharge: 2018-10-15 | Disposition: A | Discharge status: Home / Self Care | Payer: Medicaid Other | Attending: Orthopedic Surgery | Admitting: Orthopedic Surgery

## 2018-10-15 ENCOUNTER — Encounter (HOSPITAL_BASED_OUTPATIENT_CLINIC_OR_DEPARTMENT_OTHER)
Admission: RE | Disposition: A | Discharge status: Home / Self Care | Payer: Self-pay | Source: Home / Self Care | Attending: Orthopedic Surgery

## 2018-10-15 ENCOUNTER — Other Ambulatory Visit: Payer: Self-pay

## 2018-10-15 ENCOUNTER — Ambulatory Visit (HOSPITAL_BASED_OUTPATIENT_CLINIC_OR_DEPARTMENT_OTHER): Payer: Medicaid Other | Admitting: Anesthesiology

## 2018-10-15 DIAGNOSIS — M24521 Contracture, right elbow: Secondary | ICD-10-CM | POA: Diagnosis not present

## 2018-10-15 DIAGNOSIS — M659 Synovitis and tenosynovitis, unspecified: Secondary | ICD-10-CM | POA: Insufficient documentation

## 2018-10-15 DIAGNOSIS — T84122A Displacement of internal fixation device of bone of right forearm, initial encounter: Secondary | ICD-10-CM | POA: Insufficient documentation

## 2018-10-15 DIAGNOSIS — Z87891 Personal history of nicotine dependence: Secondary | ICD-10-CM | POA: Insufficient documentation

## 2018-10-15 DIAGNOSIS — Y831 Surgical operation with implant of artificial internal device as the cause of abnormal reaction of the patient, or of later complication, without mention of misadventure at the time of the procedure: Secondary | ICD-10-CM | POA: Diagnosis not present

## 2018-10-15 HISTORY — PX: HARDWARE REMOVAL: SHX979

## 2018-10-15 HISTORY — PX: CAPSULAR RELEASE: SHX6293

## 2018-10-15 LAB — POCT PREGNANCY, URINE: Preg Test, Ur: NEGATIVE

## 2018-10-15 SURGERY — CAPSULAR RELEASE
Anesthesia: Monitor Anesthesia Care | Site: Elbow | Laterality: Right

## 2018-10-15 MED ORDER — ONDANSETRON HCL 4 MG/2ML IJ SOLN
INTRAMUSCULAR | Status: DC | PRN
  Administered 2018-10-15: 4 mg via INTRAVENOUS

## 2018-10-15 MED ORDER — CLONIDINE HCL (ANALGESIA) 100 MCG/ML EP SOLN
EPIDURAL | Status: DC | PRN
  Administered 2018-10-15: 100 ug

## 2018-10-15 MED ORDER — CHLORHEXIDINE GLUCONATE 4 % EX LIQD: 60.0000 mL | Freq: Once | CUTANEOUS | Status: DC

## 2018-10-15 MED ORDER — ACETAMINOPHEN 500 MG PO TABS
1000.0000 mg | ORAL_TABLET | Freq: Once | ORAL | Status: AC
  Administered 2018-10-15: 1000 mg via ORAL

## 2018-10-15 MED ORDER — MIDAZOLAM HCL 2 MG/2ML IJ SOLN
INTRAMUSCULAR | Status: AC
  Filled 2018-10-15: qty 2

## 2018-10-15 MED ORDER — LACTATED RINGERS IV SOLN
INTRAVENOUS | Status: DC
  Administered 2018-10-15: 07:00:00 via INTRAVENOUS

## 2018-10-15 MED ORDER — FENTANYL CITRATE (PF) 100 MCG/2ML IJ SOLN
INTRAMUSCULAR | Status: AC
  Filled 2018-10-15: qty 2

## 2018-10-15 MED ORDER — FENTANYL CITRATE (PF) 100 MCG/2ML IJ SOLN
50.0000 ug | INTRAMUSCULAR | Status: DC | PRN
  Administered 2018-10-15 (×2): 50 ug via INTRAVENOUS

## 2018-10-15 MED ORDER — FENTANYL CITRATE (PF) 100 MCG/2ML IJ SOLN: 25.0000 ug | INTRAMUSCULAR | Status: DC | PRN

## 2018-10-15 MED ORDER — ACETAMINOPHEN 500 MG PO TABS
ORAL_TABLET | ORAL | Status: AC
  Filled 2018-10-15: qty 2

## 2018-10-15 MED ORDER — MIDAZOLAM HCL 2 MG/2ML IJ SOLN
1.0000 mg | INTRAMUSCULAR | Status: DC | PRN
  Administered 2018-10-15: 2 mg via INTRAVENOUS

## 2018-10-15 MED ORDER — SCOPOLAMINE 1 MG/3DAYS TD PT72: 1.0000 | MEDICATED_PATCH | Freq: Once | TRANSDERMAL | Status: DC | PRN

## 2018-10-15 MED ORDER — ONDANSETRON HCL 4 MG/2ML IJ SOLN
INTRAMUSCULAR | Status: AC
  Filled 2018-10-15: qty 2

## 2018-10-15 MED ORDER — CEFAZOLIN SODIUM-DEXTROSE 2-4 GM/100ML-% IV SOLN
2.0000 g | INTRAVENOUS | Status: AC
  Administered 2018-10-15: 2 g via INTRAVENOUS

## 2018-10-15 MED ORDER — CEFAZOLIN SODIUM-DEXTROSE 2-4 GM/100ML-% IV SOLN
INTRAVENOUS | Status: AC
  Filled 2018-10-15: qty 100

## 2018-10-15 MED ORDER — ROPIVACAINE HCL 5 MG/ML IJ SOLN
INTRAMUSCULAR | Status: DC | PRN
  Administered 2018-10-15: 20 mL via PERINEURAL

## 2018-10-15 MED ORDER — BUPIVACAINE HCL (PF) 0.25 % IJ SOLN
INTRAMUSCULAR | Status: AC
  Filled 2018-10-15: qty 60

## 2018-10-15 MED ORDER — PROPOFOL 500 MG/50ML IV EMUL
INTRAVENOUS | Status: DC | PRN
  Administered 2018-10-15: 50 ug/kg/min via INTRAVENOUS

## 2018-10-15 SURGICAL SUPPLY — 64 items
BANDAGE ACE 3X5.8 VEL STRL LF (GAUZE/BANDAGES/DRESSINGS) ×3 IMPLANT
BANDAGE ACE 4X5 VEL STRL LF (GAUZE/BANDAGES/DRESSINGS) IMPLANT
BLADE SURG 15 STRL LF DISP TIS (BLADE) ×2 IMPLANT
BLADE SURG 15 STRL SS (BLADE) ×6
BNDG COHESIVE 3X5 TAN STRL LF (GAUZE/BANDAGES/DRESSINGS) IMPLANT
BNDG CONFORM 3 STRL LF (GAUZE/BANDAGES/DRESSINGS) ×3 IMPLANT
BNDG GAUZE ELAST 4 BULKY (GAUZE/BANDAGES/DRESSINGS) ×3 IMPLANT
BRUSH SCRUB EZ PLAIN DRY (MISCELLANEOUS) ×3 IMPLANT
CANISTER SUCT 1200ML W/VALVE (MISCELLANEOUS) ×2 IMPLANT
CLOSURE WOUND 1/2 X4 (GAUZE/BANDAGES/DRESSINGS)
CORD BIPOLAR FORCEPS 12FT (ELECTRODE) ×3 IMPLANT
COVER BACK TABLE 60X90IN (DRAPES) ×3 IMPLANT
COVER WAND RF STERILE (DRAPES) IMPLANT
CUFF TOURN SGL QUICK 18X4 (TOURNIQUET CUFF) IMPLANT
DECANTER SPIKE VIAL GLASS SM (MISCELLANEOUS) IMPLANT
DRAIN TLS ROUND 10FR (DRAIN) IMPLANT
DRAPE EXTREMITY T 121X128X90 (DISPOSABLE) ×3 IMPLANT
DRAPE SURG 17X23 STRL (DRAPES) ×3 IMPLANT
DRSG EMULSION OIL 3X3 NADH (GAUZE/BANDAGES/DRESSINGS) ×3 IMPLANT
EVACUATOR 1/8 PVC DRAIN (DRAIN) ×2 IMPLANT
GAUZE 4X4 16PLY RFD (DISPOSABLE) IMPLANT
GLOVE BIO SURGEON STRL SZ 6.5 (GLOVE) ×3 IMPLANT
GLOVE BIO SURGEON STRL SZ8 (GLOVE) ×1 IMPLANT
GLOVE BIO SURGEONS STRL SZ 6.5 (GLOVE) ×3
GLOVE BIOGEL M STRL SZ7.5 (GLOVE) IMPLANT
GLOVE BIOGEL PI IND STRL 7.0 (GLOVE) IMPLANT
GLOVE BIOGEL PI INDICATOR 7.0 (GLOVE) ×6
GLOVE SS BIOGEL STRL SZ 8 (GLOVE) ×1 IMPLANT
GLOVE SUPERSENSE BIOGEL SZ 8 (GLOVE) ×2
GOWN STRL REUS W/ TWL LRG LVL3 (GOWN DISPOSABLE) ×1 IMPLANT
GOWN STRL REUS W/ TWL XL LVL3 (GOWN DISPOSABLE) ×1 IMPLANT
GOWN STRL REUS W/TWL LRG LVL3 (GOWN DISPOSABLE) ×9
GOWN STRL REUS W/TWL XL LVL3 (GOWN DISPOSABLE) ×3
NDL HYPO 25X1 1.5 SAFETY (NEEDLE) IMPLANT
NEEDLE HYPO 22GX1.5 SAFETY (NEEDLE) IMPLANT
NEEDLE HYPO 25X1 1.5 SAFETY (NEEDLE) IMPLANT
NS IRRIG 1000ML POUR BTL (IV SOLUTION) ×3 IMPLANT
PACK BASIN DAY SURGERY FS (CUSTOM PROCEDURE TRAY) ×3 IMPLANT
PAD ALCOHOL SWAB (MISCELLANEOUS) IMPLANT
PAD CAST 3X4 CTTN HI CHSV (CAST SUPPLIES) ×1 IMPLANT
PAD CAST 4YDX4 CTTN HI CHSV (CAST SUPPLIES) IMPLANT
PADDING CAST ABS 3INX4YD NS (CAST SUPPLIES) ×2
PADDING CAST ABS 4INX4YD NS (CAST SUPPLIES) ×2
PADDING CAST ABS COTTON 3X4 (CAST SUPPLIES) ×1 IMPLANT
PADDING CAST ABS COTTON 4X4 ST (CAST SUPPLIES) ×1 IMPLANT
PADDING CAST COTTON 3X4 STRL (CAST SUPPLIES) ×3
PADDING CAST COTTON 4X4 STRL (CAST SUPPLIES) ×3
SHEET MEDIUM DRAPE 40X70 STRL (DRAPES) ×3 IMPLANT
SPLINT PLASTER CAST XFAST 3X15 (CAST SUPPLIES) IMPLANT
SPLINT PLASTER CAST XFAST 4X15 (CAST SUPPLIES) IMPLANT
SPLINT PLASTER XTRA FAST SET 4 (CAST SUPPLIES)
SPLINT PLASTER XTRA FASTSET 3X (CAST SUPPLIES)
STOCKINETTE 4X48 STRL (DRAPES) ×3 IMPLANT
STRIP CLOSURE SKIN 1/2X4 (GAUZE/BANDAGES/DRESSINGS) IMPLANT
SUCTION FRAZIER HANDLE 10FR (MISCELLANEOUS)
SUCTION TUBE FRAZIER 10FR DISP (MISCELLANEOUS) IMPLANT
SUT PROLENE 3 0 PS 2 (SUTURE) ×2 IMPLANT
SUT PROLENE 4 0 PS 2 18 (SUTURE) ×3 IMPLANT
SYR BULB 3OZ (MISCELLANEOUS) ×3 IMPLANT
SYR CONTROL 10ML LL (SYRINGE) IMPLANT
TOWEL GREEN STERILE FF (TOWEL DISPOSABLE) ×6 IMPLANT
TUBE CONNECTING 20'X1/4 (TUBING) ×1
TUBE CONNECTING 20X1/4 (TUBING) ×1 IMPLANT
UNDERPAD 30X30 (UNDERPADS AND DIAPERS) ×3 IMPLANT

## 2018-10-15 NOTE — Progress Notes (Signed)
Assisted D. Woodrum with right, left, supraclavicular block. Side rails up, monitors on throughout procedure. See vital signs in flow sheet. Tolerated Procedure well.

## 2018-10-15 NOTE — H&P (Signed)
Theresa Finley is an 28 y.o. female.   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  Patient had a prior ORIF in the distant past and radial head arthroplasty.  Over the course of time is my notes detail her hardware is failed.  We have discussed and recommended hardware removal synovectomy and reconstruction is necessary.  Patient understands these issues and desires to proceed.  We discussed relevant findings concerns and pre-and postop issues in great detail.  Past Medical History:  Diagnosis Date  . Medical history non-contributory     Past Surgical History:  Procedure Laterality Date  . ORIF RADIAL FRACTURE  07/27/2012   Procedure: OPEN REDUCTION INTERNAL FIXATION (ORIF) RADIAL FRACTURE;  Surgeon: Dominica Severin, MD;  Location: [facility] OR;  Service: Orthopedics;  Laterality: Right;  ORIF Right Radial Head Fracture    Family History  Problem Relation Age of Onset  . Cancer Mother    Social History:  reports that she quit smoking about 5 years ago. She has never used smokeless tobacco. She reports current drug use. Drug: Marijuana. She reports that she does not drink alcohol.  Allergies: No Known Allergies  Medications Prior to Admission  Medication Sig Dispense Refill  . oxycodone (OXY-IR) 5 MG capsule Take 5 mg by mouth every 4 (four) hours as needed.      Results for orders placed or performed during the hospital encounter of 10/15/18 (from the past 48 hour(s))  Pregnancy, urine POC     Status: None   Collection Time: 10/15/18  6:58 AM  Result Value Ref Range   Preg Test, Ur NEGATIVE NEGATIVE    Comment:        THE SENSITIVITY OF THIS METHODOLOGY IS >24 mIU/mL    No results found.  Review of Systems  Respiratory: Negative.   Cardiovascular: Negative.   Gastrointestinal: Negative.   Genitourinary: Negative.     Blood pressure 115/78, pulse 77,  temperature 98.1 F (36.7 C), temperature source Oral, resp. rate 16, height 5\' 4"  (1.626 m), weight 58.8 kg, last menstrual period 10/10/2018, SpO2 100 %, not currently breastfeeding. Physical Exam  Fractured hardware and loose hardware about the right elbow.  The patient is intact to sensation and motor function although she has limited range of motion to the elbow and chronic pain.  I reviewed this with she and her family at length.  The patient is alert and oriented in no acute distress. The patient complains of pain in the affected upper extremity.  The patient is noted to have a normal HEENT exam. Lung fields show equal chest expansion and no shortness of breath. Abdomen exam is nontender without distention. Lower extremity examination does not show any fracture dislocation or blood clot symptoms. Pelvis is stable and the neck and back are stable and nontender. Assessment/Plan We will plan for hardware removal and repair reconstruction is necessary as outlined to the patient.  She understands this is my notes detail  We are planning surgery for your upper extremity. The risk and benefits of surgery to include risk of bleeding, infection, anesthesia,  damage to normal structures and failure of the surgery to accomplish its intended goals of relieving symptoms and restoring function have been discussed in detail. With this in mind we plan to proceed. I have specifically discussed with the patient the pre-and postoperative regime and the dos and don'ts and risk and benefits in great detail. Risk  and benefits of surgery also include risk of dystrophy(CRPS), chronic nerve pain, failure of the healing process to go onto completion and other inherent risks of surgery The relavent the pathophysiology of the disease/injury process, as well as the alternatives for treatment and postoperative course of action has been discussed in great detail with the patient who desires to proceed.  We will do  everything in our power to help you (the patient) restore function to the upper extremity. It is a pleasure to see this patient today.   Oletta Cohn III, MD 10/15/2018, 7:29 AM

## 2018-10-15 NOTE — Addendum Note (Signed)
Addendum  created 10/15/18 1202 by Elmer Picker, MD   Child order released for a procedure order, Clinical Note Signed, Intraprocedure Blocks edited, Intraprocedure Meds edited

## 2018-10-15 NOTE — Anesthesia Procedure Notes (Signed)
Anesthesia Regional Block: Supraclavicular block   Pre-Anesthetic Checklist: ,, timeout performed, Correct Patient, Correct Site, Correct Laterality, Correct Procedure, Correct Position, site marked, Risks and benefits discussed,  Surgical consent,  Pre-op evaluation,  At surgeon's request and post-op pain management  Laterality: Right  Prep: Maximum Sterile Barrier Precautions used, chloraprep       Needles:  Injection technique: Single-shot  Needle Type: Echogenic Stimulator Needle     Needle Length: 5cm  Needle Gauge: 22     Additional Needles:   Procedures:,,,, ultrasound used (permanent image in chart),,,,  Narrative:  Start time: 10/15/2018 7:27 AM End time: 10/15/2018 7:37 AM Injection made incrementally with aspirations every 5 mL.  Performed by: Personally  Anesthesiologist: Elmer Picker, MD  Additional Notes: Monitors applied. No increased pain on injection. No increased resistance to injection. Injection made in 5cc increments. Good needle visualization. Patient tolerated procedure well.
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
## 2018-10-15 NOTE — Transfer of Care (Signed)
Immediate Anesthesia Transfer of Care Note  Patient: Theresa Finley  Procedure(s) Performed: Right elbow hardware removal, arthrotomy and synovectomy right elbow with capsular release (Right Elbow) HARDWARE REMOVAL (Right Elbow)  Patient Location: PACU  Anesthesia Type:MAC combined with regional for post-op pain  Level of Consciousness: awake, alert  and oriented  Airway & Oxygen Therapy: Patient Spontanous Breathing  Post-op Assessment: Report given to RN and Post -op Vital signs reviewed and stable  Post vital signs: Reviewed and stable  Last Vitals:  Vitals Value Taken Time  BP    Temp    Pulse 65 10/15/2018  9:07 AM  Resp    SpO2 100 % 10/15/2018  9:07 AM  Vitals shown include unvalidated device data.  Last Pain:  Vitals:   10/15/18 0711  TempSrc: Oral  PainSc: 0-No pain         Complications: No apparent anesthesia complications

## 2018-10-15 NOTE — Anesthesia Postprocedure Evaluation (Signed)
Anesthesia Post Note  Patient: Kendalynn L Mccarley  Procedure(s) Performed: Right elbow hardware removal, arthrotomy and synovectomy right elbow with capsular release (Right Elbow) HARDWARE REMOVAL (Right Elbow)     Patient location during evaluation: PACU Anesthesia Type: Regional and MAC Level of consciousness: awake and alert Pain management: pain level controlled Vital Signs Assessment: post-procedure vital signs reviewed and stable Respiratory status: spontaneous breathing, nonlabored ventilation, respiratory function stable and patient connected to nasal cannula oxygen Cardiovascular status: stable and blood pressure returned to baseline Postop Assessment: no apparent nausea or vomiting Anesthetic complications: no    Last Vitals:  Vitals:   10/15/18 0915 10/15/18 0945  BP:  120/78  Pulse:  65  Resp:  14  Temp:  36.8 C  SpO2: 100% 100%    Last Pain:  Vitals:   10/15/18 0945  TempSrc:   PainSc: 0-No pain                 Jerame Hedding L Shain Pauwels

## 2018-10-15 NOTE — Discharge Instructions (Signed)
Please elevate move and massage her fingers once the block wears off.  Please take the antibiotic until it is completed.  You have a 14-day course of antibiotics.  We will call to see you in 14 days.  I have called in Robaxin and oxycodone for purposes of pain control and muscle relaxation postoperatively.  If you have any problems please call us.  Please be very careful with your arm and keep the bandage clean and dry.    Keep bandage clean and dry.  Call for any problems.  No smoking.  Criteria for driving a car: you should be off your pain medicine for 7-8 hours, Schrupp to drive one handed(confident), thinking clearly and feeling Swett in your judgement to drive. Continue elevation as it will decrease swelling.  If instructed by MD move your fingers within the confines of the bandage/splint.  Use ice if instructed by your MD. Call immediately for any sudden loss of feeling in your hand/arm or change in functional abilities of the extremity.   We recommend that you to take vitamin C 1000 mg a day to promote healing. We also recommend that if you require  pain medicine that you take a stool softener to prevent constipation as most pain medicines will have constipation side effects. We recommend either Peri-Colace or Senokot and recommend that you also consider adding MiraLAX as well to prevent the constipation affects from pain medicine if you are required to use them. These medicines are over the counter and may be purchased at a local pharmacy. A cup of yogurt and a probiotic can also be helpful during the recovery process as the medicines can disrupt your intestinal environment.  *You had 1000 MG of Tylenol at 7 am   Post Anesthesia Home Care Instructions  Activity: Get plenty of rest for the remainder of the day. A responsible individual must stay with you for 24 hours following the procedure.  For the next 24 hours, DO NOT: -Drive a car -Advertising copywriter -Drink alcoholic  beverages -Take any medication unless instructed by your physician -Make any legal decisions or sign important papers.  Meals: Start with liquid foods such as gelatin or soup. Progress to regular foods as tolerated. Avoid greasy, spicy, heavy foods. If nausea and/or vomiting occur, drink only clear liquids until the nausea and/or vomiting subsides. Call your physician if vomiting continues.  Special Instructions/Symptoms: Your throat may feel dry or sore from the anesthesia or the breathing tube placed in your throat during surgery. If this causes discomfort, gargle with warm salt water. The discomfort should disappear within 24 hours.  If you had a scopolamine patch placed behind your ear for the management of post- operative nausea and/or vomiting:  1. The medication in the patch is effective for 72 hours, after which it should be removed.  Wrap patch in a tissue and discard in the trash. Wash hands thoroughly with soap and water. 2. You may remove the patch earlier than 72 hours if you experience unpleasant side effects which may include dry mouth, dizziness or visual disturbances. 3. Avoid touching the patch. Wash your hands with soap and water after contact with the patch.       Regional Anesthesia Blocks  1. Numbness or the inability to move the "blocked" extremity may last from 3-48 hours after placement. The length of time depends on the medication injected and your individual response to the medication. If the numbness is not going away after 48 hours, call your surgeon.  2. The extremity that is blocked will need to be protected until the numbness is gone and the  Strength has returned. Because you cannot feel it, you will need to take extra care to avoid injury. Because it may be weak, you may have difficulty moving it or using it. You may not know what position it is in without looking at it while the block is in effect.  3. For blocks in the legs and feet, returning to weight  bearing and walking needs to be done carefully. You will need to wait until the numbness is entirely gone and the strength has returned. You should be Stivers to move your leg and foot normally before you try and bear weight or walk. You will need someone to be with you when you first try to ensure you do not fall and possibly risk injury.  4. Bruising and tenderness at the needle site are common side effects and will resolve in a few days.  5. Persistent numbness or new problems with movement should be communicated to the surgeon or the [facility] [phone]).

## 2018-10-16 ENCOUNTER — Encounter (HOSPITAL_BASED_OUTPATIENT_CLINIC_OR_DEPARTMENT_OTHER): Payer: Self-pay | Admitting: Orthopedic Surgery

## 2018-10-20 LAB — AEROBIC/ANAEROBIC CULTURE W GRAM STAIN (SURGICAL/DEEP WOUND)
Culture: NO GROWTH
Culture: NO GROWTH

## 2018-10-20 LAB — AEROBIC/ANAEROBIC CULTURE (SURGICAL/DEEP WOUND)

## 2018-12-20 ENCOUNTER — Encounter: Payer: Self-pay | Admitting: *Deleted

## 2019-06-11 ENCOUNTER — Other Ambulatory Visit: Payer: Self-pay

## 2019-06-11 ENCOUNTER — Encounter (HOSPITAL_COMMUNITY): Payer: Self-pay | Admitting: Emergency Medicine

## 2019-06-11 ENCOUNTER — Emergency Department (HOSPITAL_COMMUNITY)
Admission: EM | Admit: 2019-06-11 | Discharge: 2019-06-11 | Disposition: A | Discharge status: Home / Self Care | Payer: Medicaid Other | Attending: Emergency Medicine | Admitting: Emergency Medicine

## 2019-06-11 DIAGNOSIS — H9203 Otalgia, bilateral: Secondary | ICD-10-CM | POA: Diagnosis present

## 2019-06-11 DIAGNOSIS — H6983 Other specified disorders of Eustachian tube, bilateral: Secondary | ICD-10-CM | POA: Diagnosis not present

## 2019-06-11 DIAGNOSIS — Z87891 Personal history of nicotine dependence: Secondary | ICD-10-CM | POA: Diagnosis not present

## 2019-06-11 NOTE — Discharge Instructions (Signed)
We are sorry you are not feeling well! Your exam is normal today.  Your symptoms seem most consistent with Eustachian Tube dysfunction. Read instructions in this paperwork for over-the-counter treatments. It is also recommended you avoid tobacco/marijuana smoke, as this can be contributing to your symptoms. You can schedule an appointment with the [facility] specialist or your primary care provider if symptoms do not improve.

## 2019-06-11 NOTE — ED Provider Notes (Signed)
[facility] EMERGENCY DEPARTMENT Provider Note   CSN: [id number] Arrival date & time: 06/11/19  1046     History   Chief Complaint Chief Complaint  Patient presents with  . Cerumen Impaction    HPI Theresa Finley is a 28 y.o. female presenting to the ED with complaint of ear fullness x6 months. Pt reports symptoms began about 6 months ago.  She states she was evaluated at that time was told she had some fluid behind her ears.  She has been treating her symptoms with over-the-counter medications as instructed such as eardrops, antihistamines, and decongestants without relief.  She states she has a constant fullness sensation in bilateral ears like they need to pop.  She denies pain, tinnitus, fever.  She has not followed up outpatient for her symptoms.    The history is provided by the patient.    Past Medical History:  Diagnosis Date  . Medical history non-contributory     Patient Active Problem List   Diagnosis Date Noted  . Labor and delivery, indication for care 03/08/2018  . Limited prenatal care in third trimester 02/13/2018  . Trichomonas infection 01/30/2018  . Supervision of low-risk pregnancy 10/22/2017    Past Surgical History:  Procedure Laterality Date  . CAPSULAR RELEASE Right 10/15/2018   Procedure: Right elbow hardware removal, arthrotomy and synovectomy right elbow with capsular release;  Surgeon: Roseanne Kaufman, MD;  Location: [facility];  Service: Orthopedics;  Laterality: Right;  2 hrs for both procedures  . HARDWARE REMOVAL Right 10/15/2018   Procedure: HARDWARE REMOVAL;  Surgeon: Roseanne Kaufman, MD;  Location: [facility];  Service: Orthopedics;  Laterality: Right;  . ORIF RADIAL FRACTURE  07/27/2012   Procedure: OPEN REDUCTION INTERNAL FIXATION (ORIF) RADIAL FRACTURE;  Surgeon: Roseanne Kaufman, MD;  Location: [facility];  Service: Orthopedics;  Laterality: Right;  ORIF Right Radial Head Fracture     OB History     Gravida  4   Para  3   Term  3   Preterm  0   AB  1   Living  3     SAB  1   TAB  0   Ectopic  0   Multiple  0   Live Births  3            Home Medications    Prior to Admission medications   Medication Sig Start Date End Date Taking? Authorizing Provider  oxycodone (OXY-IR) 5 MG capsule Take 5 mg by mouth every 4 (four) hours as needed.    [provider]    Family History Family History  Problem Relation Age of Onset  . Cancer Mother     Social History Social History   Tobacco Use  . Smoking status: Former Smoker    Quit date: 02/18/2013    Years since quitting: 6.3  . Smokeless tobacco: Never Used  . Tobacco comment: Patient reports that she rarly smokes  Substance Use Topics  . Alcohol use: No  . Drug use: Yes    Types: Marijuana    Comment: last use of weed 02/16/2018     Allergies   Patient has no known allergies.   Review of Systems Review of Systems  All other systems reviewed and are negative.    Physical Exam Updated Vital Signs BP 102/71   Pulse 85   Temp 98.3 F (36.8 C) (Oral)   Resp 15   Ht 5' (1.524 m)  Wt 56.7 kg   SpO2 100%   BMI 24.41 kg/m   Physical Exam Vitals signs and nursing note reviewed.  Constitutional:      General: She is not in acute distress.    Appearance: She is well-developed.  HENT:     Head: Normocephalic and atraumatic.     Right Ear: Tympanic membrane, ear canal and external ear normal. There is no impacted cerumen.     Left Ear: Tympanic membrane, ear canal and external ear normal. There is no impacted cerumen.     Nose: Nose normal.     Mouth/Throat:     Mouth: Mucous membranes are moist.     Pharynx: Oropharynx is clear. Uvula midline.  Eyes:     Conjunctiva/sclera: Conjunctivae normal.  Pulmonary:     Effort: Pulmonary effort is normal.  Neurological:     Mental Status: She is alert.  Psychiatric:        Mood and Affect: Mood normal.        Behavior: Behavior  normal.      ED Treatments / Results  Labs (all labs ordered are listed, but only abnormal results are displayed) Labs Reviewed - No data to display  EKG None  Radiology No results found.  Procedures Procedures (including critical care time)  Medications Ordered in ED Medications - No data to display   Initial Impression / Assessment and Plan / ED Course  I have reviewed the triage vital signs and the nursing notes.  Pertinent labs & imaging results that were available during my care of the patient were reviewed by me and considered in my medical decision making (see chart for details).        Patient presenting with symptoms consistent with eustachian tube dysfunction. Exam is overall benign, no effusion is present, external ear canals are normal, no cerumen impaction.  No acute interventions indicated at this time. Discussed continued symptomatic management and importance of outpatient follow-up.   Discussed results, findings, treatment and follow up. Patient advised of return precautions. Patient verbalized understanding and agreed with plan.  Final Clinical Impressions(s) / ED Diagnoses   Final diagnoses:  Eustachian tube dysfunction, bilateral    ED Discharge Orders    None       , [place] N, PA-C 06/11/19 1125    Virgina Norfolk, DO 06/11/19 1200

## 2019-06-11 NOTE — ED Triage Notes (Signed)
Pt reports her ears being clogged for 6 months.

## 2019-07-25 NOTE — L&D Delivery Note (Signed)
       Delivery Note   Theresa Finley is a 29 y.o. [id number] at [redacted]w[redacted]d Estimated Date of Delivery: 10/31/19  PRE-OPERATIVE DIAGNOSIS:  1) [redacted]w[redacted]d pregnancy.  2) SROM contractions  POST-OPERATIVE DIAGNOSIS:  1) [redacted]w[redacted]d pregnancy s/p Vaginal, Spontaneous    Delivery Type: Vaginal, Spontaneous    Delivery Anesthesia:    Labor Complications:      ESTIMATED BLOOD LOSS: 100  ml    FINDINGS:   1) female infant, Apgar scores of 8   at 1 minute and 9   at 5 minutes and a birthweight of   ounces.    2) Nuchal cord: leg cord X2  SPECIMENS:   PLACENTA:   Appearance: Intact    Removal: Spontaneous      Disposition:    DISPOSITION:  Infant to left in stable condition in the delivery room, with L&D personnel and mother,  NARRATIVE SUMMARY: Labor course:  Theresa Finley is a [id number] at [redacted]w[redacted]d who presented for SROM.  She progressed well in labor without pitocin.   She evidenced good maternal expulsive effort during the second stage. She went on to deliver a viable infant. The placenta delivered without problems and was noted to be complete. A perineal and vaginal examination was performed. Episiotomy/Lacerations: None  The patient tolerated this well.  Elonda Husky, M.D. 10/13/2019 1:28 AM

## 2019-07-31 ENCOUNTER — Other Ambulatory Visit: Payer: Self-pay | Admitting: Obstetrics and Gynecology

## 2019-07-31 ENCOUNTER — Encounter: Payer: Self-pay | Admitting: Obstetrics and Gynecology

## 2019-07-31 ENCOUNTER — Ambulatory Visit (INDEPENDENT_AMBULATORY_CARE_PROVIDER_SITE_OTHER): Payer: Medicaid Other

## 2019-07-31 ENCOUNTER — Other Ambulatory Visit: Payer: Self-pay

## 2019-07-31 ENCOUNTER — Ambulatory Visit (INDEPENDENT_AMBULATORY_CARE_PROVIDER_SITE_OTHER): Payer: Medicaid Other | Admitting: Obstetrics and Gynecology

## 2019-07-31 VITALS — BP 114/64 | HR 78 | Ht 60.0 in | Wt 125.8 lb

## 2019-07-31 DIAGNOSIS — O99322 Drug use complicating pregnancy, second trimester: Secondary | ICD-10-CM

## 2019-07-31 DIAGNOSIS — N939 Abnormal uterine and vaginal bleeding, unspecified: Secondary | ICD-10-CM

## 2019-07-31 DIAGNOSIS — Z363 Encounter for antenatal screening for malformations: Secondary | ICD-10-CM

## 2019-07-31 DIAGNOSIS — Z1379 Encounter for other screening for genetic and chromosomal anomalies: Secondary | ICD-10-CM

## 2019-07-31 DIAGNOSIS — Z0283 Encounter for blood-alcohol and blood-drug test: Secondary | ICD-10-CM

## 2019-07-31 DIAGNOSIS — Z3A26 26 weeks gestation of pregnancy: Secondary | ICD-10-CM

## 2019-07-31 DIAGNOSIS — Z3482 Encounter for supervision of other normal pregnancy, second trimester: Secondary | ICD-10-CM

## 2019-07-31 DIAGNOSIS — O0932 Supervision of pregnancy with insufficient antenatal care, second trimester: Secondary | ICD-10-CM | POA: Diagnosis not present

## 2019-07-31 DIAGNOSIS — O09892 Supervision of other high risk pregnancies, second trimester: Secondary | ICD-10-CM | POA: Diagnosis not present

## 2019-07-31 DIAGNOSIS — O99332 Smoking (tobacco) complicating pregnancy, second trimester: Secondary | ICD-10-CM

## 2019-07-31 DIAGNOSIS — F17200 Nicotine dependence, unspecified, uncomplicated: Secondary | ICD-10-CM

## 2019-07-31 DIAGNOSIS — Z113 Encounter for screening for infections with a predominantly sexual mode of transmission: Secondary | ICD-10-CM

## 2019-07-31 DIAGNOSIS — F1290 Cannabis use, unspecified, uncomplicated: Secondary | ICD-10-CM

## 2019-07-31 NOTE — Progress Notes (Signed)
Pt present today for possible miscarriage and nob pe. Pt stated that she was doing well.

## 2019-07-31 NOTE — Patient Instructions (Signed)

## 2019-08-01 LAB — TOXOPLASMA ANTIBODIES- IGG AND  IGM
Toxoplasma Antibody- IgM: <3 AU/mL (ref 0.0–7.9)
Toxoplasma IgG Ratio: <3 IU/mL (ref 0.0–7.1)

## 2019-08-01 LAB — HEPATITIS B SURFACE ANTIGEN: Hepatitis B Surface Ag: NEGATIVE

## 2019-08-01 LAB — URINALYSIS, ROUTINE W REFLEX MICROSCOPIC
Bilirubin, UA: NEGATIVE
Glucose, UA: NEGATIVE
Ketones, UA: NEGATIVE
Leukocytes,UA: NEGATIVE
Nitrite, UA: NEGATIVE
Protein,UA: NEGATIVE
RBC, UA: NEGATIVE
Specific Gravity, UA: 1.016 (ref 1.005–1.030)
Urobilinogen, Ur: 0.2 mg/dL (ref 0.2–1.0)
pH, UA: 8 — ABNORMAL HIGH (ref 5.0–7.5)

## 2019-08-01 LAB — RUBELLA SCREEN: Rubella Antibodies, IGG: 2.33 index (ref >0.99)

## 2019-08-01 LAB — HIV ANTIBODY (ROUTINE TESTING W REFLEX): HIV Screen 4th Generation wRfx: NONREACTIVE

## 2019-08-01 LAB — ABO AND RH: Rh Factor: POSITIVE

## 2019-08-01 LAB — ANTIBODY SCREEN: Antibody Screen: NEGATIVE

## 2019-08-01 LAB — GC/CHLAMYDIA PROBE AMP
Chlamydia trachomatis, NAA: NEGATIVE
Neisseria Gonorrhoeae by PCR: NEGATIVE

## 2019-08-01 LAB — HGB SOLU + RFLX FRAC: Sickle Solubility Test - HGBRFX: NEGATIVE

## 2019-08-01 LAB — VARICELLA ZOSTER ANTIBODY, IGG: Varicella zoster IgG: <135 index — ABNORMAL LOW (ref >165)

## 2019-08-01 LAB — RPR: RPR Ser Ql: NONREACTIVE

## 2019-08-02 LAB — URINE CULTURE, OB REFLEX: Organism ID, Bacteria: NO GROWTH

## 2019-08-02 LAB — CULTURE, OB URINE

## 2019-08-03 ENCOUNTER — Encounter: Payer: Self-pay | Admitting: Obstetrics and Gynecology

## 2019-08-03 DIAGNOSIS — F17200 Nicotine dependence, unspecified, uncomplicated: Secondary | ICD-10-CM | POA: Insufficient documentation

## 2019-08-03 NOTE — Progress Notes (Signed)
OBSTETRIC INITIAL PRENATAL VISIT  Subjective:    Theresa Finley is being seen today for her first obstetrical visit.  This is not a planned pregnancy. She is a 29 y.o. [id number] female at [redacted]w[redacted]d gestation, Estimated Date of Delivery: 10/31/19 with Patient's last menstrual period was 10/10/2018 (exact date), consistent with today's.  Patient notes that she is late to prenatal care as she thought that she had a miscarriage during her first trimester.  Noted bleeding with passage of large clots, and so she assumed she miscarried (did not f/u with an OB/GYN to confirm). Once she discovered she was still pregnant several weeks ago (after noting persistent fetal movement), she was trying to decide if she would still keep the pregnancy, but now notes she and her partner are fine with the pregnancy. Her obstetrical history is significant for smoker (tobacco and marijuana). Relationship with FOB: significant other, living together. Patient is unsure of intent to breast feed (reports breastfeeding in the past with her other pregnancies, but was hard). Pregnancy history fully reviewed.    OB History  Gravida Para Term Preterm AB Living  5 3 3  0 1 3  SAB TAB Ectopic Multiple Live Births  1 0 0 0 3    # Outcome Date GA Lbr Len/2nd Weight Sex Delivery Anes PTL Lv  5 Current           4 Term 03/08/18 [redacted]w[redacted]d 09:10 / 00:13 6 lb 9 oz (2.977 kg) F Vag-Spont EPI  LIV     Name: Smolen,GIRL Morrisa     Apgar1: 9  Apgar5: 9  3 SAB 03/24/17 [redacted]w[redacted]d            Birth Comments: per patient had 3 home upt's positive , but did not go to doctor and had a lot of bleeding  2 Term 05/25/14 [redacted]w[redacted]d 04:58 / 00:14 5 lb 7 oz (2.466 kg) F Vag-Spont EPI  LIV     Name: Fix,GIRL Jeanna     Apgar1: 9  Apgar5: 9  1 Term 2012 [redacted]w[redacted]d  6 lb 14 oz (3.118 kg)  Vag-Spont EPI  LIV     Birth Comments: no complications    Gynecologic History:  Last pap smear was 10/22/2017.  Results were normal.  Denies h/o abnormal pap smears in the past.    Reports history of STIs: Trichomoniasis in 10/2017.  Contraception: None  Past Medical History:  Diagnosis Date  . Medical history non-contributory   . Trichomonas infection 10/31/2017   Neg 12/15/17    Family History  Problem Relation Age of Onset  . Cancer Mother     Past Surgical History:  Procedure Laterality Date  . CAPSULAR RELEASE Right 10/15/2018   Procedure: Right elbow hardware removal, arthrotomy and synovectomy right elbow with capsular release;  Surgeon: 10/17/2018, MD;  Location: [facility];  Service: Orthopedics;  Laterality: Right;  2 hrs for both procedures  . HARDWARE REMOVAL Right 10/15/2018   Procedure: HARDWARE REMOVAL;  Surgeon: 10/17/2018, MD;  [facility];  Service: Orthopedics;  Laterality: Right;  . ORIF RADIAL FRACTURE  07/27/2012   Procedure: OPEN REDUCTION INTERNAL FIXATION (ORIF) RADIAL FRACTURE;  Surgeon: 09/24/2012, MD;  Location: [facility];  Service: Orthopedics;  Laterality: Right;  ORIF Right Radial Head Fracture    Social History   Socioeconomic History  . Marital status: Single    Spouse name: Not on file  . Number of children: Not on file  . Years  of education: Not on file  . Highest education level: Not on file  Occupational History  . Not on file  Tobacco Use  . Smoking status: Former Smoker    Quit date: 02/18/2013    Years since quitting: 6.4  . Smokeless tobacco: Never Used  . Tobacco comment: Patient reports that she rarly smokes  Substance and Sexual Activity  . Alcohol use: No  . Drug use: Yes    Types: Marijuana    Comment: last use of weed 02/16/2018  . Sexual activity: Yes    Birth control/protection: None  Other Topics Concern  . Not on file  Social History Narrative  . Not on file   Social Determinants of Health   Financial Resource Strain:   . Difficulty of Paying Living Expenses: Not on file  Food Insecurity:   . Worried About Charity fundraiser in the Last Year:  Not on file  . Ran Out of Food in the Last Year: Not on file  Transportation Needs:   . Lack of Transportation (Medical): Not on file  . Lack of Transportation (Non-Medical): Not on file  Physical Activity:   . Days of Exercise per Week: Not on file  . Minutes of Exercise per Session: Not on file  Stress:   . Feeling of Stress : Not on file  Social Connections:   . Frequency of Communication with Friends and Family: Not on file  . Frequency of Social Gatherings with Friends and Family: Not on file  . Attends Religious Services: Not on file  . Active Member of Clubs or Organizations: Not on file  . Attends Archivist Meetings: Not on file  . Marital Status: Not on file  Intimate Partner Violence:   . Fear of Current or Ex-Partner: Not on file  . Emotionally Abused: Not on file  . Physically Abused: Not on file  . Sexually Abused: Not on file    No current outpatient medications on file prior to visit.   No current facility-administered medications on file prior to visit.    No Known Allergies   Review of Systems General: Not Present- Fever, Weight Loss and Weight Gain. Skin: Not Present- Rash. HEENT: Not Present- Blurred Vision, Headache and Bleeding Gums. Respiratory: Not Present- Difficulty Breathing. Breast: Not Present- Breast Mass. Cardiovascular: Not Present- Chest Pain, Elevated Blood Pressure, Fainting / Blacking Out and Shortness of Breath. Gastrointestinal: Not Present- Abdominal Pain, Constipation, Nausea and Vomiting. Female Genitourinary: Not Present- Frequency, Painful Urination, Pelvic Pain, Vaginal Bleeding, Vaginal Discharge, Contractions, regular, Fetal Movements Decreased, Urinary Complaints and Vaginal Fluid. Musculoskeletal: Not Present- Back Pain and Leg Cramps. Neurological: Not Present- Dizziness. Psychiatric: Not Present- Depression.     Objective:   Blood pressure 114/64, pulse 78, weight 125 lb 12.8 oz (57.1 kg), last menstrual  period 10/10/2018, not currently breastfeeding.  Body mass index is 24.57 kg/m.  General Appearance:    Alert, cooperative, no distress, appears stated age  Head:    Normocephalic, without obvious abnormality, atraumatic  Eyes:    PERRL, conjunctiva/corneas clear, EOM's intact, both eyes  Ears:    Normal external ear canals, both ears  Nose:   Nares normal, septum midline, mucosa normal, no drainage or sinus tenderness  Throat:   Lips, mucosa, and tongue normal; teeth and gums normal  Neck:   Supple, symmetrical, trachea midline, no adenopathy; thyroid: no enlargement/tenderness/nodules; no carotid bruit or JVD  Back:     Symmetric, no curvature, ROM normal, no CVA  tenderness  Lungs:     Clear to auscultation bilaterally, respirations unlabored  Chest Wall:    No tenderness or deformity   Heart:    Regular rate and rhythm, S1 and S2 normal, no murmur, rub or gallop  Breast Exam:    No tenderness, masses, or nipple abnormality  Abdomen:     Soft, non-tender, bowel sounds active all four quadrants, no masses, no organomegaly.  FH 27.  FHT 150  bpm.  Genitalia:    Pelvic:external genitalia normal, vagina without lesions, discharge, or tenderness, rectovaginal septum  normal. Cervix normal in appearance, no cervical motion tenderness, no adnexal masses or tenderness.  Pregnancy positive findings: uterine enlargement: 27 wk size, nontender.   Rectal:    Normal external sphincter.  No hemorrhoids appreciated. Internal exam not done.   Extremities:   Extremities normal, atraumatic, no cyanosis or edema  Pulses:   2+ and symmetric all extremities  Skin:   Skin color, texture, turgor normal, no rashes or lesions  Lymph nodes:   Cervical, supraclavicular, and axillary nodes normal  Neurologic:   CNII-XII intact, normal strength, sensation and reflexes throughout      Assessment:   1. Encounter for supervision of other normal pregnancy in second trimester   2. Genetic screening   3. Late prenatal  care affecting pregnancy in second trimester   4. Encounter for drug screening   5. Screening examination for STD (sexually transmitted disease)   6. Smoking     Plan:   1. Supervision of normal pregnancy  -  Initial labs ordered.  - Pap smear up to date.  - Prenatal vitamins encouraged. - Problem list reviewed and updated. - New OB counseling:  The patient has been given an overview regarding routine prenatal care.  Recommendations regarding diet, weight gain, and exercise in pregnancy were given. - Prenatal testing, optional genetic testing, and ultrasound use in pregnancy were reviewed.  [id number] ordered today.  - Benefits of Breast Feeding were discussed. The patient is encouraged to consider nursing her baby post partum. - Dating and anatomy scan performed today, consistent with LMP.  - Patient declines flu vaccine.  2. Smoker  - Counseled on smoking and tobacco cessation.  3. Follow up in 2 weeks.  Will need glucola that visit.   50% of 30 min visit spent on counseling and coordination of care.    The patient has Medicaid.  [facility] Medicaid Risk Screening Form completed today    Hildred Laser, MD [facility]

## 2019-08-06 LAB — DRUG PROFILE, UR, 9 DRUGS (LABCORP)
Amphetamines, Urine: NEGATIVE ng/mL
Barbiturate Quant, Ur: NEGATIVE ng/mL
Benzodiazepine Quant, Ur: NEGATIVE ng/mL
Cannabinoid Quant, Ur: POSITIVE — AB
Cocaine (Metab.): NEGATIVE ng/mL
Methadone Screen, Urine: NEGATIVE ng/mL
Opiate Quant, Ur: NEGATIVE ng/mL
PCP Quant, Ur: NEGATIVE ng/mL
Propoxyphene: NEGATIVE ng/mL

## 2019-08-06 LAB — MATERNIT21  PLUS CORE+ESS+SCA, BLOOD
11q23 deletion (Jacobsen): NOT DETECTED
15q11 deletion (PW Angelman): NOT DETECTED
1p36 deletion syndrome: NOT DETECTED
22q11 deletion (DiGeorge): NOT DETECTED
4p16 deletion(Wolf-Hirschhorn): NOT DETECTED
5p15 deletion (Cri-du-chat): NOT DETECTED
8q24 deletion (Langer-Giedion): NOT DETECTED
Fetal Fraction: 18
Monosomy X (Turner Syndrome): NOT DETECTED
Result (T21): NEGATIVE
Trisomy 13 (Patau syndrome): NEGATIVE
Trisomy 16: NOT DETECTED
Trisomy 18 (Edwards syndrome): NEGATIVE
Trisomy 21 (Down syndrome): NEGATIVE
Trisomy 22: NOT DETECTED
XXX (Triple X Syndrome): NOT DETECTED
XXY (Klinefelter Syndrome): NOT DETECTED
XYY (Jacobs Syndrome): NOT DETECTED

## 2019-08-06 LAB — NICOTINE SCREEN, URINE: Cotinine Ql Scrn, Ur: POSITIVE ng/mL — AB

## 2019-08-19 ENCOUNTER — Other Ambulatory Visit: Payer: Medicaid Other

## 2019-08-19 ENCOUNTER — Encounter: Payer: Self-pay | Admitting: Surgical

## 2019-08-19 ENCOUNTER — Ambulatory Visit (INDEPENDENT_AMBULATORY_CARE_PROVIDER_SITE_OTHER): Payer: Medicaid Other | Admitting: Obstetrics and Gynecology

## 2019-08-19 ENCOUNTER — Other Ambulatory Visit: Payer: Self-pay

## 2019-08-19 VITALS — BP 106/65 | HR 66 | Wt 133.0 lb

## 2019-08-19 DIAGNOSIS — F1290 Cannabis use, unspecified, uncomplicated: Secondary | ICD-10-CM

## 2019-08-19 DIAGNOSIS — O09893 Supervision of other high risk pregnancies, third trimester: Secondary | ICD-10-CM | POA: Diagnosis not present

## 2019-08-19 DIAGNOSIS — Z3483 Encounter for supervision of other normal pregnancy, third trimester: Secondary | ICD-10-CM

## 2019-08-19 DIAGNOSIS — O99323 Drug use complicating pregnancy, third trimester: Secondary | ICD-10-CM

## 2019-08-19 DIAGNOSIS — O0933 Supervision of pregnancy with insufficient antenatal care, third trimester: Secondary | ICD-10-CM

## 2019-08-19 DIAGNOSIS — O09213 Supervision of pregnancy with history of pre-term labor, third trimester: Secondary | ICD-10-CM | POA: Diagnosis not present

## 2019-08-19 DIAGNOSIS — Z3A29 29 weeks gestation of pregnancy: Secondary | ICD-10-CM

## 2019-08-19 DIAGNOSIS — O99333 Smoking (tobacco) complicating pregnancy, third trimester: Secondary | ICD-10-CM | POA: Diagnosis not present

## 2019-08-19 NOTE — Progress Notes (Signed)
ROB: Patient complains of occasional back pain and occasional heartburn.  Have discussed symptomatic relief measures.  1 hour GCT today.  Patient not taking prenatal vitamins as directed-samples given. Patient has described history of preterm birth all births prior to 36 weeks.  Signs and symptoms of labor discussed/reviewed.

## 2019-08-20 LAB — CBC
Hematocrit: 34 % (ref 34.0–46.6)
Hemoglobin: 11.6 g/dL (ref 11.1–15.9)
MCH: 32.8 pg (ref 26.6–33.0)
MCHC: 34.1 g/dL (ref 31.5–35.7)
MCV: 96 fL (ref 79–97)
Platelets: 125 10*3/uL — ABNORMAL LOW (ref 150–450)
RBC: 3.54 x10E6/uL — ABNORMAL LOW (ref 3.77–5.28)
RDW: 12.7 % (ref 11.7–15.4)
WBC: 7 10*3/uL (ref 3.4–10.8)

## 2019-08-20 LAB — GLUCOSE, 1 HOUR GESTATIONAL: Gestational Diabetes Screen: 73 mg/dL (ref 65–139)

## 2019-08-20 LAB — RPR: RPR Ser Ql: NONREACTIVE

## 2019-09-08 NOTE — Progress Notes (Deleted)
ROB-Pt present for routine prenatal care 

## 2019-09-09 ENCOUNTER — Encounter: Payer: Medicaid Other | Admitting: Obstetrics and Gynecology

## 2019-09-19 ENCOUNTER — Encounter: Payer: Medicaid Other | Admitting: Obstetrics and Gynecology

## 2019-10-12 ENCOUNTER — Inpatient Hospital Stay
Admission: EM | Admit: 2019-10-12 | Discharge: 2019-10-14 | DRG: 806 | Discharge status: Against Medical Advice | Payer: Medicaid Other | Attending: Obstetrics and Gynecology | Admitting: Obstetrics and Gynecology

## 2019-10-12 ENCOUNTER — Encounter: Payer: Self-pay | Admitting: Obstetrics and Gynecology

## 2019-10-12 ENCOUNTER — Other Ambulatory Visit: Payer: Self-pay

## 2019-10-12 DIAGNOSIS — O99324 Drug use complicating childbirth: Secondary | ICD-10-CM | POA: Diagnosis present

## 2019-10-12 DIAGNOSIS — Z20822 Contact with and (suspected) exposure to covid-19: Secondary | ICD-10-CM | POA: Diagnosis present

## 2019-10-12 DIAGNOSIS — O99334 Smoking (tobacco) complicating childbirth: Secondary | ICD-10-CM | POA: Diagnosis present

## 2019-10-12 DIAGNOSIS — F1290 Cannabis use, unspecified, uncomplicated: Secondary | ICD-10-CM | POA: Diagnosis present

## 2019-10-12 DIAGNOSIS — Z5329 Procedure and treatment not carried out because of patient's decision for other reasons: Secondary | ICD-10-CM | POA: Diagnosis present

## 2019-10-12 DIAGNOSIS — Z3A37 37 weeks gestation of pregnancy: Secondary | ICD-10-CM | POA: Diagnosis not present

## 2019-10-12 DIAGNOSIS — O26893 Other specified pregnancy related conditions, third trimester: Secondary | ICD-10-CM | POA: Diagnosis present

## 2019-10-12 LAB — CBC
HCT: 36 % (ref 36.0–46.0)
Hemoglobin: 12.1 g/dL (ref 12.0–15.0)
MCH: 32 pg (ref 26.0–34.0)
MCHC: 33.6 g/dL (ref 30.0–36.0)
MCV: 95.2 fL (ref 80.0–100.0)
Platelets: 113 10*3/uL — ABNORMAL LOW (ref 150–400)
RBC: 3.78 MIL/uL — ABNORMAL LOW (ref 3.87–5.11)
RDW: 13.3 % (ref 11.5–15.5)
WBC: 9.6 10*3/uL (ref 4.0–10.5)
nRBC: 0 % (ref 0.0–0.2)

## 2019-10-12 LAB — RESPIRATORY PANEL BY RT PCR (FLU A&B, COVID)
Influenza A by PCR: NEGATIVE
Influenza B by PCR: NEGATIVE
SARS Coronavirus 2 by RT PCR: NEGATIVE

## 2019-10-12 LAB — URINE DRUG SCREEN, QUALITATIVE (ARMC ONLY)
Amphetamines, Ur Screen: NOT DETECTED
Barbiturates, Ur Screen: NOT DETECTED
Benzodiazepine, Ur Scrn: NOT DETECTED
Cannabinoid 50 Ng, Ur ~~LOC~~: POSITIVE — AB
Cocaine Metabolite,Ur ~~LOC~~: NOT DETECTED
MDMA (Ecstasy)Ur Screen: NOT DETECTED
Methadone Scn, Ur: NOT DETECTED
Opiate, Ur Screen: NOT DETECTED
Phencyclidine (PCP) Ur S: NOT DETECTED
Tricyclic, Ur Screen: NOT DETECTED

## 2019-10-12 MED ORDER — AMMONIA AROMATIC IN INHA
RESPIRATORY_TRACT | Status: AC
  Filled 2019-10-12: qty 10

## 2019-10-12 MED ORDER — LACTATED RINGERS IV SOLN: 500.0000 mL | INTRAVENOUS | Status: DC | PRN

## 2019-10-12 MED ORDER — LIDOCAINE HCL (PF) 1 % IJ SOLN: 30.0000 mL | INTRAMUSCULAR | Status: DC | PRN

## 2019-10-12 MED ORDER — ONDANSETRON HCL 4 MG/2ML IJ SOLN: 4.0000 mg | Freq: Four times a day (QID) | INTRAMUSCULAR | Status: DC | PRN

## 2019-10-12 MED ORDER — MISOPROSTOL 200 MCG PO TABS
ORAL_TABLET | ORAL | Status: AC
  Filled 2019-10-12: qty 4

## 2019-10-12 MED ORDER — SODIUM CHLORIDE 0.9 % IV SOLN
2.0000 g | Freq: Once | INTRAVENOUS | Status: AC
  Administered 2019-10-12: 2 g via INTRAVENOUS
  Filled 2019-10-12: qty 2000

## 2019-10-12 MED ORDER — ACETAMINOPHEN 325 MG PO TABS: 650.0000 mg | ORAL_TABLET | ORAL | Status: DC | PRN

## 2019-10-12 MED ORDER — SOD CITRATE-CITRIC ACID 500-334 MG/5ML PO SOLN: 30.0000 mL | ORAL | Status: DC | PRN

## 2019-10-12 MED ORDER — OXYTOCIN 10 UNIT/ML IJ SOLN
INTRAMUSCULAR | Status: AC
  Filled 2019-10-12: qty 2

## 2019-10-12 MED ORDER — OXYTOCIN BOLUS FROM INFUSION
500.0000 mL | Freq: Once | INTRAVENOUS | Status: AC
  Administered 2019-10-13: 500 mL via INTRAVENOUS

## 2019-10-12 MED ORDER — OXYTOCIN 40 UNITS IN NORMAL SALINE INFUSION - SIMPLE MED
2.5000 [IU]/h | INTRAVENOUS | Status: DC
  Filled 2019-10-12: qty 1000

## 2019-10-12 MED ORDER — BUTORPHANOL TARTRATE 1 MG/ML IJ SOLN: 1.0000 mg | INTRAMUSCULAR | Status: DC | PRN

## 2019-10-12 MED ORDER — LIDOCAINE HCL (PF) 1 % IJ SOLN
INTRAMUSCULAR | Status: AC
  Filled 2019-10-12: qty 30

## 2019-10-12 MED ORDER — LACTATED RINGERS IV SOLN: INTRAVENOUS | Status: DC

## 2019-10-12 NOTE — Progress Notes (Signed)
Pt refusing external fetal monitoring. RN educated pt on importance and significance of monitoring well being of her baby and her contractions. Pt ripped monitors off continuously. D.Evans, MD aware.

## 2019-10-12 NOTE — H&P (Signed)
History and Physical   HPI  Theresa Finley is a 29 y.o. E3P2951 at [redacted]w[redacted]d Estimated Date of Delivery: 10/31/19 who is being admitted for SROM and contractions. Pregnancy complicated by limited prenatal visits. Tob and Mj use during pregnancy. Pt has repeatedly refused cervical checks and external fetal monitoring. No says contractions are stronger and occ. Feels increasing urge to push.  OB History  OB History  Gravida Para Term Preterm AB Living  5 3 3  0 1 3  SAB TAB Ectopic Multiple Live Births  1 0 0 0 3    # Outcome Date GA Lbr Len/2nd Weight Sex Delivery Anes PTL Lv  5 Current           4 Term 03/08/18 [redacted]w[redacted]d 09:10 / 00:13 2977 g F Vag-Spont EPI  LIV     Name: CLAUDELL, RHODY     Apgar1: 9  Apgar5: 9  3 SAB 03/24/17 [redacted]w[redacted]d            Birth Comments: per patient had 3 home upt's positive , but did not go to doctor and had a lot of bleeding  2 Term 05/25/14 [redacted]w[redacted]d 04:58 / 00:14 2466 g F Vag-Spont EPI  LIV     Name: Landowski,GIRL Nataki     Apgar1: 9  Apgar5: 9  1 Term 2012 [redacted]w[redacted]d  3118 g  Vag-Spont EPI  LIV     Birth Comments: no complications    PROBLEM LIST  Pregnancy complications or risks: Patient Active Problem List   Diagnosis Date Noted  . Normal labor 10/12/2019  . Smoking 08/03/2019  . Labor and delivery, indication for care 03/08/2018  . Supervision of low-risk pregnancy 10/22/2017    Prenatal labs and studies: ABO, Rh: A/Positive/-- (01/07 1629) Antibody: Negative (01/07 1629) Rubella: 2.33 (01/07 1629) RPR: Non Reactive (01/26 0925)  HBsAg: Negative (01/07 1629)  HIV: Non Reactive (01/07 1629)  GBS:    Past Medical History:  Diagnosis Date  . Medical history non-contributory   . Trichomonas infection 01/30/2018   Neg 12/15/17     Past Surgical History:  Procedure Laterality Date  . CAPSULAR RELEASE Right 10/15/2018   Procedure: Right elbow hardware removal, arthrotomy and synovectomy right elbow with capsular release;  Surgeon: 10/17/2018, MD;  Location: [facility];  Service: Orthopedics;  Laterality: Right;  2 hrs for both procedures  . HARDWARE REMOVAL Right 10/15/2018   Procedure: HARDWARE REMOVAL;  Surgeon: 10/17/2018, MD;  Location: [facility];  Service: Orthopedics;  Laterality: Right;  . ORIF RADIAL FRACTURE  07/27/2012   Procedure: OPEN REDUCTION INTERNAL FIXATION (ORIF) RADIAL FRACTURE;  Surgeon: 09/24/2012, MD;  Location: [facility];  Service: Orthopedics;  Laterality: Right;  ORIF Right Radial Head Fracture     Medications    There are no discharge medications for this patient.    Allergies  Patient has no known allergies.  Review of Systems  Pertinent items are noted in HPI.  Physical Exam  LMP 10/10/2018 (Exact Date)   Lungs:  CTA B Cardio: RRR without M/R/G Abd: Soft, gravid, NT Presentation: cephalic EXT: No C/C/ 1+ Edema DTRs: 2+ B CERVIX: Dilation: 5 Effacement (%): 80 Cervical Position: Middle Station: -2 Presentation: Vertex Exam by:: K.Maldonado,RN    FHR:  refused  Toco:  refused   Test Results  Results for orders placed or performed during the hospital encounter of 10/12/19 (from the past 24 hour(s))  CBC     Status:  Abnormal   Collection Time: 10/12/19 10:03 PM  Result Value Ref Range   WBC 9.6 4.0 - 10.5 K/uL   RBC 3.78 (L) 3.87 - 5.11 MIL/uL   Hemoglobin 12.1 12.0 - 15.0 g/dL   HCT 36.0 36.0 - 46.0 %   MCV 95.2 80.0 - 100.0 fL   MCH 32.0 26.0 - 34.0 pg   MCHC 33.6 30.0 - 36.0 g/dL   RDW 13.3 11.5 - 15.5 %   Platelets 113 (L) 150 - 400 K/uL   nRBC 0.0 0.0 - 0.2 %  Urine Drug Screen, Qualitative ([facility] only)     Status: Abnormal   Collection Time: 10/12/19 10:03 PM  Result Value Ref Range   Tricyclic, Ur Screen NONE DETECTED NONE DETECTED   Amphetamines, Ur Screen NONE DETECTED NONE DETECTED   MDMA (Ecstasy)Ur Screen NONE DETECTED NONE DETECTED   Cocaine Metabolite,Ur [place] NONE DETECTED NONE DETECTED   Opiate, Ur Screen NONE  DETECTED NONE DETECTED   Phencyclidine (PCP) Ur S NONE DETECTED NONE DETECTED   Cannabinoid 50 Ng, Ur [place] POSITIVE (A) NONE DETECTED   Barbiturates, Ur Screen NONE DETECTED NONE DETECTED   Benzodiazepine, Ur Scrn NONE DETECTED NONE DETECTED   Methadone Scn, Ur NONE DETECTED NONE DETECTED   GBS unknown  Assessment   G5P3013 at [redacted]w[redacted]d Estimated Date of Delivery: 10/31/19  Unknown fetal status.   Patient Active Problem List   Diagnosis Date Noted  . Normal labor 10/12/2019  . Smoking 08/03/2019  . Labor and delivery, indication for care 03/08/2018  . Supervision of low-risk pregnancy 10/22/2017    Plan  1. Admit to L&D :   2. Prepare for delivery  Finis Bud, M.D. 10/12/2019 11:07 PM

## 2019-10-12 NOTE — Progress Notes (Addendum)
Pt grunting and involuntary pushing with contractions. Bedside RN asked pt if it would be okay to check her cervix, pt stated, "NO, move" and swatted RN away. D.Evans, MD aware.
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
## 2019-10-12 NOTE — Progress Notes (Addendum)
Pt presents to the ED c/o leaking of fluid. Pt denies vaginal bleeding and states positive fetal movement. Upon assessment pt is grossly ruptured. SVE exam performed by RN 5/80/-2. External monitors applied but pt is taking external monitors off stating "Get these off of me, they hurt." RN also unable to get pt's vital signs. FHT obtained, 130. D.Logan Bores, MD aware.

## 2019-10-12 NOTE — Progress Notes (Signed)
Pt refusing to answer admission questions, refusing external monitoring. Pt is telling bedside nurse 'Get out of my face and stressing me out. Give me my pain medicine first."

## 2019-10-12 NOTE — Progress Notes (Signed)
Pt ambulated to the bathroom and left a urine sample. Pt refusing to get off the toilet and refusing external monitoring. Pt educated. Pt refusing blood pressure check or temperature check.

## 2019-10-13 ENCOUNTER — Encounter: Payer: Self-pay | Admitting: Obstetrics and Gynecology

## 2019-10-13 DIAGNOSIS — O99334 Smoking (tobacco) complicating childbirth: Secondary | ICD-10-CM

## 2019-10-13 LAB — RPR: RPR Ser Ql: NONREACTIVE

## 2019-10-13 MED ORDER — ACETAMINOPHEN 325 MG PO TABS
650.0000 mg | ORAL_TABLET | ORAL | Status: DC | PRN
  Administered 2019-10-13: 650 mg via ORAL
  Filled 2019-10-13 (×3): qty 2

## 2019-10-13 MED ORDER — ZOLPIDEM TARTRATE 5 MG PO TABS: 5.0000 mg | ORAL_TABLET | Freq: Every evening | ORAL | Status: DC | PRN

## 2019-10-13 MED ORDER — DOCUSATE SODIUM 100 MG PO CAPS
100.0000 mg | ORAL_CAPSULE | Freq: Two times a day (BID) | ORAL | Status: DC
  Administered 2019-10-13 – 2019-10-14 (×2): 100 mg via ORAL
  Filled 2019-10-13 (×3): qty 1

## 2019-10-13 MED ORDER — SIMETHICONE 80 MG PO CHEW: 80.0000 mg | CHEWABLE_TABLET | ORAL | Status: DC | PRN

## 2019-10-13 MED ORDER — OXYTOCIN 40 UNITS IN NORMAL SALINE INFUSION - SIMPLE MED: 2.5000 [IU]/h | INTRAVENOUS | Status: DC | PRN

## 2019-10-13 MED ORDER — TETANUS-DIPHTH-ACELL PERTUSSIS 5-2.5-18.5 LF-MCG/0.5 IM SUSP: 0.5000 mL | Freq: Once | INTRAMUSCULAR | Status: DC

## 2019-10-13 MED ORDER — OXYCODONE-ACETAMINOPHEN 5-325 MG PO TABS
1.0000 | ORAL_TABLET | ORAL | Status: DC | PRN
  Administered 2019-10-13 – 2019-10-14 (×10): 1 via ORAL
  Filled 2019-10-13 (×10): qty 1

## 2019-10-13 MED ORDER — PRENATAL MULTIVITAMIN CH
1.0000 | ORAL_TABLET | Freq: Every day | ORAL | Status: DC
  Administered 2019-10-13 – 2019-10-14 (×2): 1 via ORAL
  Filled 2019-10-13 (×2): qty 1

## 2019-10-13 MED ORDER — BENZOCAINE-MENTHOL 20-0.5 % EX AERO
1.0000 "application " | INHALATION_SPRAY | CUTANEOUS | Status: DC | PRN
  Administered 2019-10-13: 1 via TOPICAL
  Filled 2019-10-13 (×2): qty 56

## 2019-10-13 MED ORDER — DIPHENHYDRAMINE HCL 25 MG PO CAPS: 25.0000 mg | ORAL_CAPSULE | Freq: Four times a day (QID) | ORAL | Status: DC | PRN

## 2019-10-13 MED ORDER — IBUPROFEN 600 MG PO TABS
600.0000 mg | ORAL_TABLET | Freq: Four times a day (QID) | ORAL | Status: DC
  Administered 2019-10-13 – 2019-10-14 (×7): 600 mg via ORAL
  Filled 2019-10-13 (×8): qty 1

## 2019-10-13 NOTE — Clinical Social Work Maternal (Signed)
CLINICAL SOCIAL WORK MATERNAL/CHILD NOTE  Patient Details  Name: Theresa Finley MRN: [id number] Date of Birth: 03-12-1991  Date:  10/13/2019  Clinical Social Worker Initiating Note:  Oleh Genin, LCSW Date/Time: Initiated:  10/13/19/      Child's Name:  Theresa Finley Parents:  Mother(MOB says FOB is involved but was not present during assessment.)   Need for Interpreter:  None   Reason for Referral:  Current Substance Use/Substance Use During Pregnancy    Address:  [place] [address]  -- MOB reported her address is [address], [place], [place]    Phone number:  [phone] (home)     Additional phone number:  Household Members/Support Persons (HM/SP):   Household Member/Support Person 1, Household Member/Support Person 2, Household Member/Support Person 3, Household Member/Support Person 4   HM/SP Name Relationship DOB or Age  HM/SP -1 [place] child 8  HM/SP -[place] child 5  HM/SP -3 [place] child 1  HM/SP -[place] spouse/FOB unknown  HM/SP -5        HM/SP -6        HM/SP -7        HM/SP -8          Natural Supports (not living in the home):  Extended Family, Friends, Immediate Family   Professional Supports:     Employment: Animator   Type of Work: Music therapist   Education:  [place] arranged:    Museum/gallery curator Resources:  Medicaid   Other Resources:  Physicist, medical , [place] Considerations Which May Impact Care:  None reported  Strengths:  Home prepared for child , Pediatrician chosen   Psychotropic Medications:         Pediatrician:    Solicitor area  Pediatrician List:   [place])  [place]      Pediatrician Fax Number:    Risk Factors/Current Problems:  Substance Use    Cognitive State:  Alert ,  [place] to Concentrate    Mood/Affect:  Calm    CSW Assessment: CSW consulted for MOB testing positive for marijuana during pregnancy. CSW met with MOB at bedside. Explained CSW role and reason for referral.   MOB reported she lives with her husband Theresa Finley) and 3 (now 4) children. She reported they have supportive family on both sides.   MOB reported she receives [facility] and Physicist, medical and plans to inform her Workers [employer] birth.   MOB denied mental health history or current issues.   CSW educated and provided information sheets on PPD and SIDS. MOB verbalized understanding.   MOB reported she has a crib, clothes, diapers, a new car seat, and other items needed for Baby [employer].   MOB reported she plans to use [place] for [facility] and that she can drive Baby to appointments.   CSW explained Drug Screen Policy and that CSW will have to make CPS report as 25 UDS was positive for marijuana. MOB verbalized understanding. MOB reported she found out that she was pregnant at [redacted] weeks and could not stop smoking marijuana. MOB reported she did cut back and that she does not smoke around her children. MOB reported she has been involved with CPS in the past for  the same reason when her previous children were born. She could not recall the name of her Worker, but said it was in [facility].   CSW called [facility] CPS, left a voicemail requesting a return call to make CPS report and to follow-up regarding any barriers to discharge.  CSW will continue to follow.  CSW Plan/Description:  Hospital Drug Screen Policy Information, Child Protective Service Report , CSW Awaiting CPS Disposition Plan, CSW Will Continue to Monitor Umbilical Cord Tissue Drug Screen Results and Make Report if Angelene Giovanni, LCSW 10/13/2019, 11:20 AM

## 2019-10-13 NOTE — Progress Notes (Addendum)
CSW received return call from [facility] CPS Intake Worker, Burnis Kingfisher. Report made. Rinaldo Cloud reported a Stage manager will be calling if there are any barriers to Baby's discharge.   3:30- CSW attempted call to Rinaldo Cloud to follow-up if report was screened in and if there are any barriers to [facility] discharge. Left a voicemail requesting a return call.    3:45- Return call from Rinaldo Cloud who reported the report was transferred to [facility] CPS due to a conflict of interest with [facility] Left a voicemail requesting a return all from [facility] CPS.  Alfonso Ramus, [place] [phone]

## 2019-10-13 NOTE — Plan of Care (Signed)
Transferred to Room 345 from L&D. Oriented to Room, Fall Prevention and POC. V/O.

## 2019-10-13 NOTE — Progress Notes (Signed)
Pt. Cont to Rate Pain at 5 despite having received Tylenol, Percocet and Ibuprofen Prior to arrival to Unit. Heating Pad Provided and Pitocin stopped and IV Saline Locked because Pt. Has been tolerating P.O. fluids, Lochia is WNL with Firm and U/-1 Fundus. She has voided 300cc upon arrival to Unit.

## 2019-10-13 NOTE — Progress Notes (Signed)
Pt bonding with infant well. Pt ambulatory and in stable condition transferred to m/b unit for couplet care. Report given to Romeo Apple, RN .

## 2019-10-13 NOTE — Lactation Note (Signed)
This note was copied from a baby's chart. Lactation Consultation Note  Patient Name: Girl Aubry Rankin [id number] Date: 10/13/2019   Mom had initially said her feeding choice was breast and formula.  Mom has only given Good Start Formula via bottle since delivery.  This is mom's second baby.  She said she breast fed her first baby for 3 months and admits that baby was healthy and smart, but only wants to bottlefeed formula with this baby.  Mom reports that she does not want the baby to get the marijuana she is smoking and she plans to go back to work early with this baby.  Mom has been positive for marijuana every time she had been tested and the baby's UDS tox screen was (+) for marijuana.  Handout given on formula preparation and reviewed.  Discussed how to dry up her milk if she was sure about her decision to only formula feed.  Discussed differences and management of full breasts, engorgement, plugged ducts and mastitis.  Encouraged mom to call if she changed her mind and decided she wanted to breast feed or pump.  Maternal Data    Feeding    LATCH Score                   Interventions    Lactation Tools Discussed/Used     Consult Status      Louis Meckel 10/13/2019, 1:55 PM

## 2019-10-14 MED ORDER — MEDROXYPROGESTERONE ACETATE 150 MG/ML IM SUSP
150.0000 mg | Freq: Once | INTRAMUSCULAR | Status: AC
  Administered 2019-10-14: 150 mg via INTRAMUSCULAR
  Filled 2019-10-14: qty 1

## 2019-10-14 NOTE — Progress Notes (Signed)
Patient ID: Theresa Finley, female   DOB: 1990/10/12, 29 y.o.   MRN: [id number]   Progress Note - Vaginal Delivery  Theresa Finley is a 29 y.o. T4S5681 now PP day 1 s/p Vaginal, Spontaneous .   Subjective:  The patient reports no complaints, up ad lib, voiding and tolerating PO She is doing very well today.  She has no complaints.  She is talkative and very happy.  Objective:  Vital signs in last 24 hours: Temp:  [97.8 F (36.6 C)-98.7 F (37.1 C)] 97.9 F (36.6 C) (03/23 0812) Pulse Rate:  [63-73] 63 (03/23 0812) Resp:  [18-20] 20 (03/23 0812) BP: (106-119)/(62-81) 110/81 (03/23 0812) SpO2:  [99 %-100 %] 99 % (03/23 2751)  Physical Exam:  General: alert, cooperative and no distress Lochia: appropriate Uterine Fundus: firm     Data Review Recent Labs    10/12/19 2203  HGB 12.1  HCT 36.0    Assessment/Plan: Active Problems:   Normal labor   Plan for discharge tomorrow Patient is staying today because the baby has to stay 1 more day for unknown GBS status.  -- Continue routine PP care.     Elonda Husky, M.D. 10/14/2019 12:14 PM

## 2019-10-14 NOTE — Plan of Care (Signed)
Pt left AMA prior to education being completed.
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
## 2019-10-14 NOTE — Progress Notes (Signed)
RN informed MOB that carseat test would be completed. RN takes carseat to perform recall check and set up. At 2025 RN returns to room to assess newborn prior to testing. MOB tells nurse "someone already checked baby and the baby is fine and I just need to know how much longer or I am leaving, I have been waiting all day". RN educated why she needed re- assessment and she proceeded to decline. After RN exited room, MOB took off infant saftey tag setting off alarm.  At 2035 [facility] at bedside. She explained the circumstances and the need to have the newborns carseat and congenital heart screen completed. SCC reiterated the importance and safety concerns it posed for the newborn.  Mother stated "I have four kids I know how to take care of her". MOB was not receptive to information provided. MOB proceeds to place infant in carseat without testing completed.  At 2100 MOB and newborn exit room with newborn sitting on top of cart with other belongings. AMA were signed. CPS worker Theone Stanley [phone] at [facility] was notified at 2130 of departure per SW note from today. Dr. Valentino Saxon text paged about departure.
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
## 2019-12-04 IMAGING — CR DG ELBOW COMPLETE 3+V*R*
4 series · 4 of 4 positions shown · non-contrast
Comparison: 06/13/2016

CLINICAL DATA: Fall

EXAM:
RIGHT ELBOW - COMPLETE 3+ VIEW

[x elbow ap right]
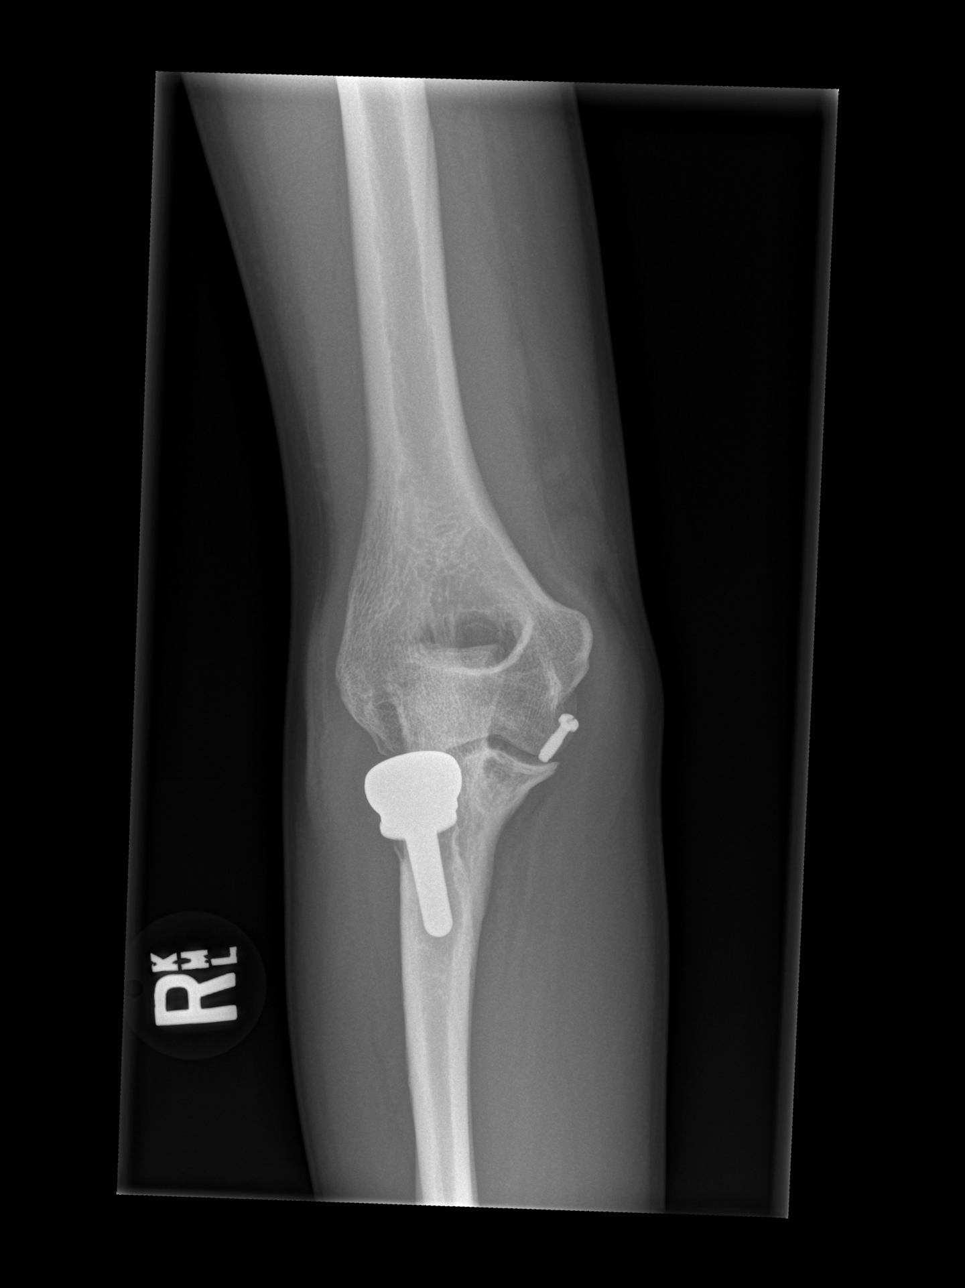

[x elbow obl right (1 of 2)]
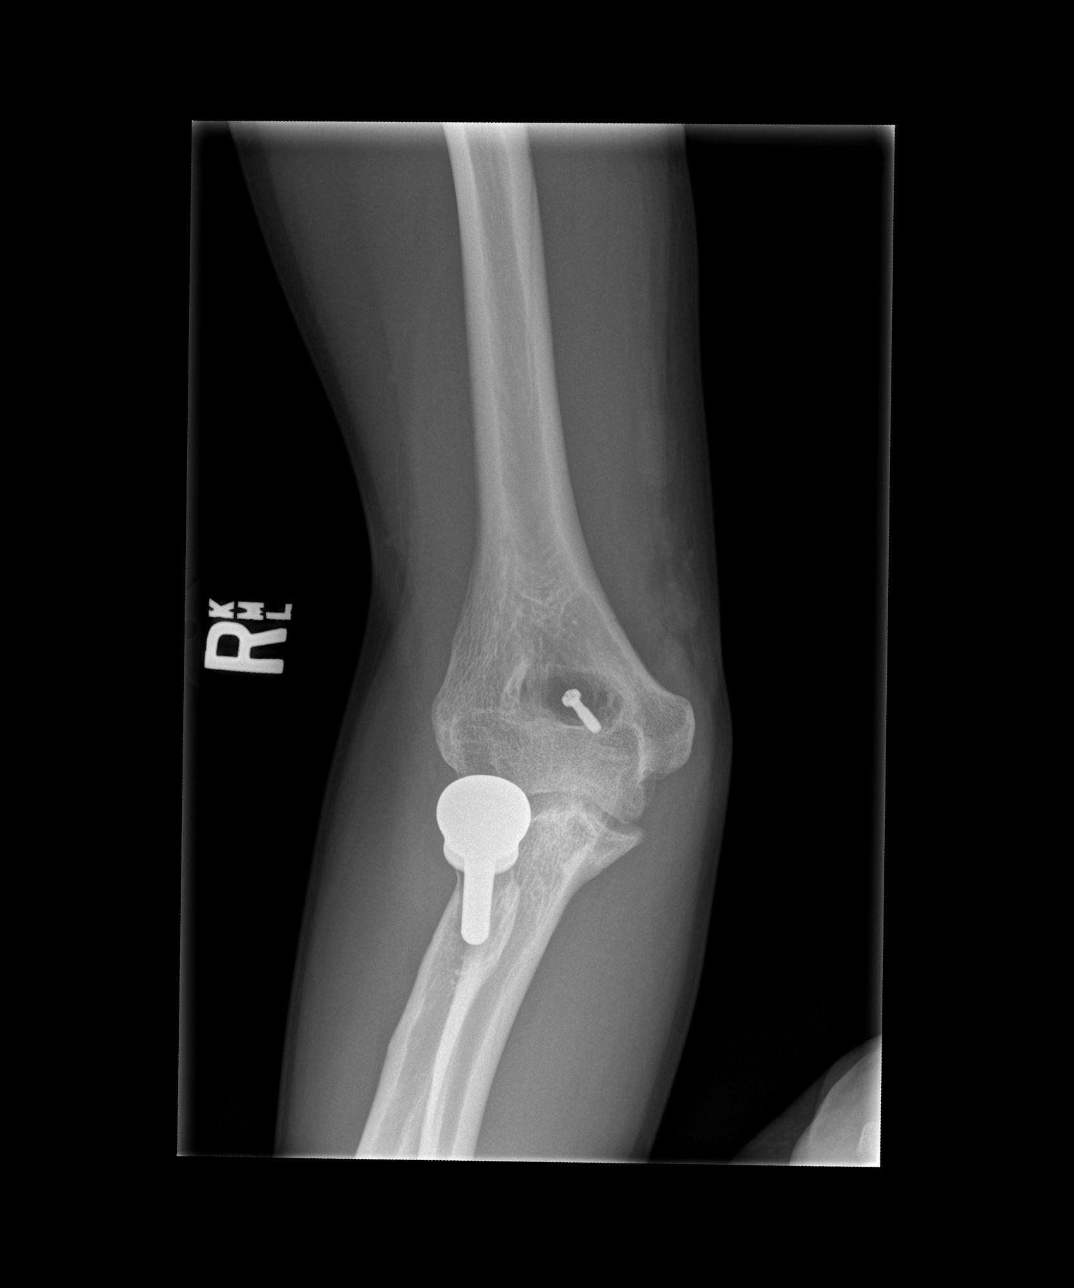

[x elbow obl right (2 of 2)]
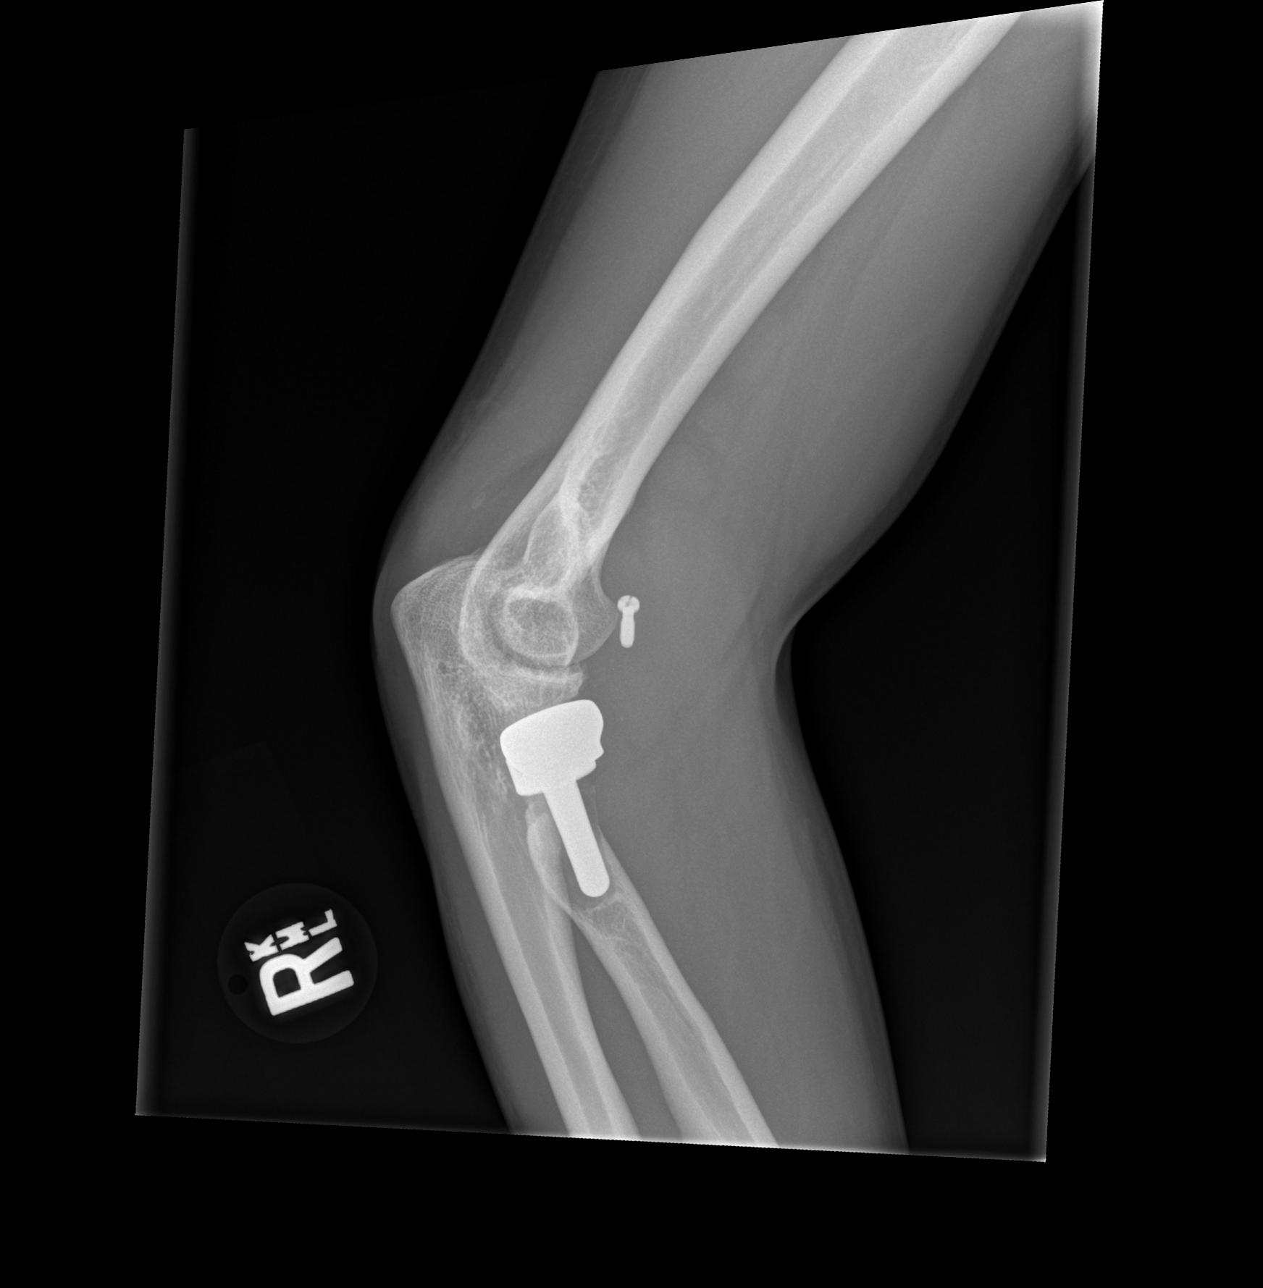

[x elbow lat right]
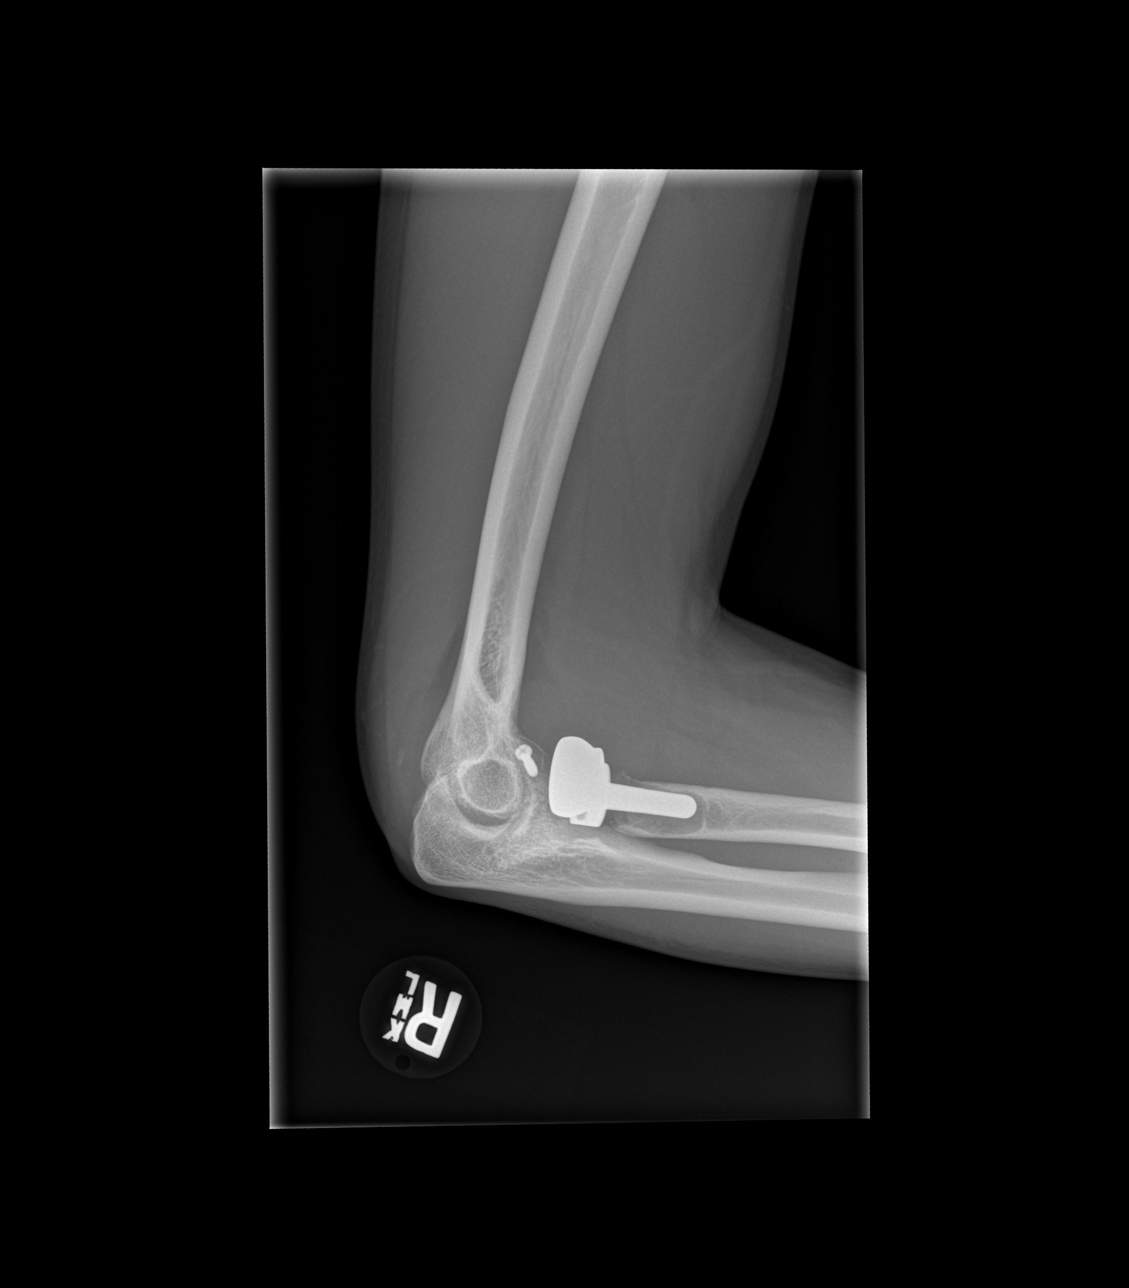

[4 of 4 positions shown; findings below may reference images not displayed]

FINDINGS: Radial head prosthetic. Worsened lucency around the radial
prosthetic stem consistent with loosening. Interval mild
dislodgement of the components of the radial prosthesis with the
head portion displaced slightly anterior with respect to the
anchored stem. Suspected free-floating screw along the anterior
elbow. Large elbow effusion. No obvious fracture.
IMPRESSION: 1. No definite acute displaced fracture.
2. Large elbow effusion
3. Radial head prosthesis with increased periprosthetic lucency at
the radial shaft consistent with loosening. Interval dislodgement of
the radial head portion of the prosthetic with respect to the
anchored stem. Apparent free-floating screw within the anterior
elbow.

## 2020-02-17 ENCOUNTER — Ambulatory Visit: Payer: Self-pay

## 2020-04-26 ENCOUNTER — Ambulatory Visit: Payer: Medicaid Other

## 2020-07-24 NOTE — L&D Delivery Note (Signed)
Delivery Summary for Theresa Finley  Labor Events:   Preterm labor: No data found  Rupture date: 01/01/2021  Rupture time: 3:18 AM  Rupture type: Artificial  Fluid Color: Clear  Induction: No data found  Augmentation: No data found  Complications: No data found  Cervical ripening: No data found No data found   No data found     Delivery:   Episiotomy: No data found  Lacerations: No data found  Repair suture: No data found  Repair # of packets: No data found  Blood loss (ml): No data found   Information for the patient's newborn:  Carime, Dinkel [[id number]]   Delivery 01/01/2021 4:51 AM by  Vaginal, Spontaneous Sex:  female Gestational Age: [redacted]w[redacted]d Delivery Clinician:   Living?:         APGARS  One minute Five minutes Ten minutes  Skin color:        Heart rate:        Grimace:        Muscle tone:        Breathing:        Totals: 9  9      Presentation/position:      Resuscitation:   Cord information:    Disposition of cord blood:     Blood gases sent?  Complications:   Placenta: Delivered:       appearance Newborn Measurements: Weight: 6 lb 5.9 oz (2890 g)  Height: 20"  Head circumference:    Chest circumference:    Other providers:    Additional  information: Forceps:   Vacuum:   Breech:   Observed anomalies      Delivery Note At 4:51 AM a viable female was delivered via  (Presentation: vertex, OP).  APGAR: 9, 9; weight 2890 grams.   Placenta status: spontaneously removed, intact.  Cord:  3-vessel with the following complications: none.  Cord pH: not obtained.  Delayed cord clamping observed > 2 minutes.   Anesthesia: Nitrous Oxide Episiotomy:  None Lacerations:  None Suture Repair:  N/A Est. Blood Loss (mL):  < 50  Mom to postpartum.  Baby to Couplet care / Skin to Skin.  Hildred Laser, MD 01/01/2021, 5:14 AM

## 2020-08-18 ENCOUNTER — Encounter: Payer: Self-pay | Admitting: Emergency Medicine

## 2020-08-18 ENCOUNTER — Other Ambulatory Visit: Payer: Self-pay

## 2020-08-18 ENCOUNTER — Emergency Department
Admission: EM | Admit: 2020-08-18 | Discharge: 2020-08-18 | Disposition: A | Discharge status: Home / Self Care | Payer: Medicaid Other | Attending: Emergency Medicine | Admitting: Emergency Medicine

## 2020-08-18 ENCOUNTER — Emergency Department: Payer: Medicaid Other

## 2020-08-18 DIAGNOSIS — T1490XA Injury, unspecified, initial encounter: Secondary | ICD-10-CM

## 2020-08-18 DIAGNOSIS — Z87891 Personal history of nicotine dependence: Secondary | ICD-10-CM | POA: Insufficient documentation

## 2020-08-18 DIAGNOSIS — W1839XA Other fall on same level, initial encounter: Secondary | ICD-10-CM | POA: Diagnosis not present

## 2020-08-18 DIAGNOSIS — Z3A20 20 weeks gestation of pregnancy: Secondary | ICD-10-CM | POA: Insufficient documentation

## 2020-08-18 DIAGNOSIS — O26892 Other specified pregnancy related conditions, second trimester: Secondary | ICD-10-CM | POA: Insufficient documentation

## 2020-08-18 DIAGNOSIS — M25552 Pain in left hip: Secondary | ICD-10-CM | POA: Insufficient documentation

## 2020-08-18 LAB — URINALYSIS, COMPLETE (UACMP) WITH MICROSCOPIC
Bilirubin Urine: NEGATIVE
Glucose, UA: NEGATIVE mg/dL
Hgb urine dipstick: NEGATIVE
Ketones, ur: 20 mg/dL — AB
Nitrite: NEGATIVE
Protein, ur: 30 mg/dL — AB
Specific Gravity, Urine: 1.017 (ref 1.005–1.030)
Squamous Epithelial / HPF: >50 — ABNORMAL HIGH (ref 0–5)
WBC, UA: >50 WBC/hpf — ABNORMAL HIGH (ref 0–5)
pH: 6 (ref 5.0–8.0)

## 2020-08-18 LAB — COMPREHENSIVE METABOLIC PANEL
ALT: 57 U/L — ABNORMAL HIGH (ref 0–44)
AST: 38 U/L (ref 15–41)
Albumin: 3.4 g/dL — ABNORMAL LOW (ref 3.5–5.0)
Alkaline Phosphatase: 53 U/L (ref 38–126)
Anion gap: 10 (ref 5–15)
BUN: 7 mg/dL (ref 6–20)
CO2: 22 mmol/L (ref 22–32)
Calcium: 8.7 mg/dL — ABNORMAL LOW (ref 8.9–10.3)
Chloride: 103 mmol/L (ref 98–111)
Creatinine, Ser: 0.54 mg/dL (ref 0.44–1.00)
GFR, Estimated: >60 mL/min (ref >60)
Glucose, Bld: 76 mg/dL (ref 70–99)
Potassium: 3.7 mmol/L (ref 3.5–5.1)
Sodium: 135 mmol/L (ref 135–145)
Total Bilirubin: 0.8 mg/dL (ref 0.3–1.2)
Total Protein: 7 g/dL (ref 6.5–8.1)

## 2020-08-18 LAB — CBC WITH DIFFERENTIAL/PLATELET
Abs Immature Granulocytes: 0.05 10*3/uL (ref 0.00–0.07)
Basophils Absolute: 0 10*3/uL (ref 0.0–0.1)
Basophils Relative: 0 %
Eosinophils Absolute: 0 10*3/uL (ref 0.0–0.5)
Eosinophils Relative: 1 %
HCT: 34.8 % — ABNORMAL LOW (ref 36.0–46.0)
Hemoglobin: 12 g/dL (ref 12.0–15.0)
Immature Granulocytes: 1 %
Lymphocytes Relative: 17 %
Lymphs Abs: 1.2 10*3/uL (ref 0.7–4.0)
MCH: 31.9 pg (ref 26.0–34.0)
MCHC: 34.5 g/dL (ref 30.0–36.0)
MCV: 92.6 fL (ref 80.0–100.0)
Monocytes Absolute: 0.4 10*3/uL (ref 0.1–1.0)
Monocytes Relative: 6 %
Neutro Abs: 5.3 10*3/uL (ref 1.7–7.7)
Neutrophils Relative %: 75 %
Platelets: 162 10*3/uL (ref 150–400)
RBC: 3.76 MIL/uL — ABNORMAL LOW (ref 3.87–5.11)
RDW: 12.7 % (ref 11.5–15.5)
WBC: 7 10*3/uL (ref 4.0–10.5)
nRBC: 0 % (ref 0.0–0.2)

## 2020-08-18 LAB — HCG, QUANTITATIVE, PREGNANCY: hCG, Beta Chain, Quant, S: 23575 m[IU]/mL — ABNORMAL HIGH (ref <5)

## 2020-08-18 MED ORDER — ACETAMINOPHEN 325 MG PO TABS
650.0000 mg | ORAL_TABLET | Freq: Once | ORAL | Status: AC
  Administered 2020-08-18: 650 mg via ORAL
  Filled 2020-08-18: qty 2
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
## 2020-11-09 ENCOUNTER — Encounter: Payer: Medicaid Other | Admitting: Obstetrics and Gynecology

## 2020-11-11 ENCOUNTER — Encounter: Payer: Self-pay | Admitting: Obstetrics and Gynecology

## 2020-11-11 ENCOUNTER — Ambulatory Visit (INDEPENDENT_AMBULATORY_CARE_PROVIDER_SITE_OTHER): Payer: Medicaid Other | Admitting: Obstetrics and Gynecology

## 2020-11-11 ENCOUNTER — Other Ambulatory Visit: Payer: Self-pay

## 2020-11-11 VITALS — BP 116/76 | HR 89 | Ht 64.0 in | Wt 149.8 lb

## 2020-11-11 DIAGNOSIS — Z3480 Encounter for supervision of other normal pregnancy, unspecified trimester: Secondary | ICD-10-CM

## 2020-11-11 DIAGNOSIS — Z113 Encounter for screening for infections with a predominantly sexual mode of transmission: Secondary | ICD-10-CM | POA: Diagnosis not present

## 2020-11-11 DIAGNOSIS — Z23 Encounter for immunization: Secondary | ICD-10-CM

## 2020-11-11 DIAGNOSIS — O0933 Supervision of pregnancy with insufficient antenatal care, third trimester: Secondary | ICD-10-CM

## 2020-11-11 DIAGNOSIS — Z3A32 32 weeks gestation of pregnancy: Secondary | ICD-10-CM

## 2020-11-11 DIAGNOSIS — Z1379 Encounter for other screening for genetic and chromosomal anomalies: Secondary | ICD-10-CM

## 2020-11-11 DIAGNOSIS — O09893 Supervision of other high risk pregnancies, third trimester: Secondary | ICD-10-CM

## 2020-11-11 DIAGNOSIS — Z8759 Personal history of other complications of pregnancy, childbirth and the puerperium: Secondary | ICD-10-CM

## 2020-11-11 DIAGNOSIS — Z0283 Encounter for blood-alcohol and blood-drug test: Secondary | ICD-10-CM

## 2020-11-11 LAB — POCT URINALYSIS DIPSTICK OB
Bilirubin, UA: NEGATIVE
Blood, UA: NEGATIVE
Glucose, UA: NEGATIVE
Ketones, UA: NEGATIVE
Leukocytes, UA: NEGATIVE
Nitrite, UA: NEGATIVE
POC,PROTEIN,UA: NEGATIVE
Spec Grav, UA: 1.025 (ref 1.010–1.025)
Urobilinogen, UA: 0.2 E.U./dL
pH, UA: 6 (ref 5.0–8.0)

## 2020-11-11 MED ORDER — TETANUS-DIPHTH-ACELL PERTUSSIS 5-2.5-18.5 LF-MCG/0.5 IM SUSY
0.5000 mL | PREFILLED_SYRINGE | Freq: Once | INTRAMUSCULAR | Status: AC
  Administered 2020-11-11: 0.5 mL via INTRAMUSCULAR

## 2020-11-11 NOTE — Addendum Note (Signed)
Addended by: Silvano Bilis on: 11/11/2020 04:16 PM   Modules accepted: Orders

## 2020-11-11 NOTE — Patient Instructions (Signed)

## 2020-11-11 NOTE — Progress Notes (Addendum)
NOB-Initial prenatal care-Pt to est care for late prenatal care. Pt c/o contractions and everything below the waist pressure. tdap and BTC completed

## 2020-11-11 NOTE — Progress Notes (Signed)
OBSTETRIC INITIAL PRENATAL VISIT  Subjective:    Theresa Finley is being seen today for her first obstetrical visit.  This is not a planned pregnancy. She is a 30 y.o. [id number] female at [redacted]w[redacted]d gestation, Estimated Date of Delivery: 01/05/21 with No LMP recorded (lmp unknown). Patient is pregnant.,  consistent with 20 week sono performed in the ER. Her obstetrical history is significant for late prenatal care, history of precipitous delivery. States that this is her 5th pregnancy and everything felt normal, so did not feel she needed to initiate care until now. Relationship with FOB: significant other, living together. Patient undecided intend to breast feed. Pregnancy history fully reviewed.    OB History  Gravida Para Term Preterm AB Living  6 4 4  0 1 4  SAB IAB Ectopic Multiple Live Births  1 0 0 0 4    # Outcome Date GA Lbr Len/2nd Weight Sex Delivery Anes PTL Lv  6 Current           5 Term 10/13/19 [redacted]w[redacted]d / 00:04 5 lb 4.7 oz (2.4 kg) F Vag-Spont   LIV     Name: MAKALYNN, BERWANGER     Apgar1: 8  Apgar5: 9  4 Term 03/08/18 [redacted]w[redacted]d 09:10 / 00:13 6 lb 9 oz (2.977 kg) F Vag-Spont EPI  LIV     Name: Elko,GIRL Aldona     Apgar1: 9  Apgar5: 9  3 SAB 03/24/17 [redacted]w[redacted]d            Birth Comments: per patient had 3 home upt's positive , but did not go to doctor and had a lot of bleeding  2 Term 05/25/14 [redacted]w[redacted]d 04:58 / 00:14 5 lb 7 oz (2.466 kg) F Vag-Spont EPI  LIV     Name: Depoy,GIRL Lizmary     Apgar1: 9  Apgar5: 9  1 Term 2012 [redacted]w[redacted]d  6 lb 14 oz (3.118 kg)  Vag-Spont EPI  LIV     Birth Comments: no complications    Gynecologic History:  Last pap smear was 10/2017.  Results were normal.  Denies h/o abnormal pap smears in the past.  History of STIs: Trichomoniasis in 2019. Contraception: None  Past Medical History:  Diagnosis Date  . Medical history non-contributory   . Trichomonas infection 01/30/2018   Neg 12/15/17    Family History  Problem Relation Age of Onset  . Cancer Mother      Past Surgical History:  Procedure Laterality Date  . CAPSULAR RELEASE Right 10/15/2018   Procedure: Right elbow hardware removal, arthrotomy and synovectomy right elbow with capsular release;  Surgeon: 10/17/2018, MD;  Location: [facility];  Service: Orthopedics;  Laterality: Right;  2 hrs for both procedures  . HARDWARE REMOVAL Right 10/15/2018   Procedure: HARDWARE REMOVAL;  Surgeon: 10/17/2018, MD;  Location: [facility];  Service: Orthopedics;  Laterality: Right;  . ORIF RADIAL FRACTURE  07/27/2012   Procedure: OPEN REDUCTION INTERNAL FIXATION (ORIF) RADIAL FRACTURE;  Surgeon: 09/24/2012, MD;  Location: [facility];  Service: Orthopedics;  Laterality: Right;  ORIF Right Radial Head Fracture    Social History   Socioeconomic History  . Marital status: Single    Spouse name: Not on file  . Number of children: Not on file  . Years of education: Not on file  . Highest education level: Not on file  Occupational History  . Not on file  Tobacco Use  . Smoking status: Former Dominica Severin  date: 02/18/2013    Years since quitting: 7.7  . Smokeless tobacco: Never Used  . Tobacco comment: Patient reports that she rarly smokes  Vaping Use  . Vaping Use: Never used  Substance and Sexual Activity  . Alcohol use: No  . Drug use: Not Currently    Types: Marijuana    Comment: last use of weed 02/16/2018  . Sexual activity: Yes    Birth control/protection: None  Other Topics Concern  . Not on file  Social History Narrative  . Not on file   Social Determinants of Health   Financial Resource Strain: Not on file  Food Insecurity: Not on file  Transportation Needs: Not on file  Physical Activity: Not on file  Stress: Not on file  Social Connections: Not on file  Intimate Partner Violence: Not on file    Current Outpatient Medications on File Prior to Visit  Medication Sig Dispense Refill  . Prenatal Vit-Fe Fumarate-FA (PRENATAL MULTIVITAMIN)  TABS tablet Take 1 tablet by mouth daily at 12 noon.     No current facility-administered medications on file prior to visit.    No Known Allergies   Review of Systems General: Not Present- Fever, Weight Loss and Weight Gain. Skin: Not Present- Rash. HEENT: Not Present- Blurred Vision, Headache and Bleeding Gums. Respiratory: Not Present- Difficulty Breathing. Breast: Not Present- Breast Mass. Cardiovascular: Not Present- Chest Pain, Elevated Blood Pressure, Fainting / Blacking Out and Shortness of Breath. Gastrointestinal: Not Present- Abdominal Pain, Constipation, Nausea and Vomiting. Female Genitourinary: Not Present- Frequency, Painful Urination, Vaginal Bleeding, Vaginal Discharge, Contractions, regular, Fetal Movements Decreased, Urinary Complaints and Vaginal Fluid.  Present - Pelvic Pressure.  Musculoskeletal: Not Present- Back Pain and Leg Cramps. Neurological: Not Present- Dizziness. Psychiatric: Not Present- Depression.     Objective:   Blood pressure 116/76, pulse 89, height 5\' 4"  (1.626 m), weight 149 lb 12.8 oz (67.9 kg), unknown if currently breastfeeding.  Body mass index is 25.71 kg/m.  General Appearance:    Alert, cooperative, no distress, appears stated age  Head:    Normocephalic, without obvious abnormality, atraumatic  Eyes:    PERRL, conjunctiva/corneas clear, EOM's intact, both eyes  Ears:    Normal external ear canals, both ears  Nose:   Nares normal, septum midline, mucosa normal, no drainage or sinus tenderness  Throat:   Lips, mucosa, and tongue normal; teeth and gums normal  Neck:   Supple, symmetrical, trachea midline, no adenopathy; thyroid: no enlargement/tenderness/nodules; no carotid bruit or JVD  Back:     Symmetric, no curvature, ROM normal, no CVA tenderness  Lungs:     Clear to auscultation bilaterally, respirations unlabored  Chest Wall:    No tenderness or deformity   Heart:    Regular rate and rhythm, S1 and S2 normal, no murmur, rub or  gallop  Breast Exam:    Declined  Abdomen:     Soft, non-tender, bowel sounds active all four quadrants, no masses, no organomegaly.  FH 29 cm.  FHT 156 bpm.  Genitalia:    Pelvic: Declined.   Extremities:   Extremities normal, atraumatic, no cyanosis or edema  Pulses:   2+ and symmetric all extremities  Skin:   Skin color, texture, turgor normal, no rashes or lesions  Neurologic:   CNII-XII intact, normal strength, sensation and reflexes throughout    Assessment:   1. Supervision of other normal pregnancy, antepartum   2. [redacted] weeks gestation of pregnancy   3. Encounter for drug screening  4. Screening examination for STD (sexually transmitted disease)   5. No prenatal care in current pregnancy in third trimester   6. Genetic screening   7. History of precipitous delivery   8. Need for diphtheria-tetanus-pertussis (Tdap) vaccine   9. Short interval between pregnancies affecting pregnancy in third trimester, antepartum    Plan:   1. Supervision of normal pregnancy with no prenatal care - Initial labs ordered.  Included HgbA1c ordered in lieu of 1 hr glucola as patient late in pregnancy and has high risk of no return.  - Declined pelvic exam or pap smear screening today. Reports that she will in the future prefer female for exams.  - Prenatal vitamins encouraged. - Problem list reviewed and updated. - New OB counseling:  The patient has been given an overview regarding routine prenatal care.  Recommendations regarding diet, weight gain, and exercise in pregnancy were given. Discussed importance of prenatal care in each pregnancy. - Prenatal testing, optional genetic testing, and ultrasound use in pregnancy were reviewed.  Discussed cell-free DNA testing, risks of false positives and need for additional invasive testing: orderedcell-free DNA testing with MaterniT21. - Benefits of Breast Feeding were discussed. The patient is encouraged to consider nursing her baby post partum. - Anatomy  scan ordered.   - Tdap administered today.  - Reviewed labs from previous pregnancy in EPIC, is Rh positive.  No need for Rhogam.  2. History of precipitous delivery - Notes history of last pregnancy with rapid delivery.  Advised on early signs of labor, also as patient tends to have most births around [redacted] weeks gestation, will need to perform GBS screen between 35-36 weeks.   3. Short interval pregnancy - Patient at risk for preterm birth, small gestational age, and other morbidities.   Follow up in 2 weeks for next prenatal visit.  The patient has Medicaid.  [facility] Medicaid Risk Screening Form completed today   50% of 30 min visit spent on counseling and coordination of care.     Hildred Laser, MD [facility]

## 2020-11-12 LAB — URINALYSIS, ROUTINE W REFLEX MICROSCOPIC
Bilirubin, UA: NEGATIVE
Glucose, UA: NEGATIVE
Ketones, UA: NEGATIVE
Leukocytes,UA: NEGATIVE
Nitrite, UA: NEGATIVE
Protein,UA: NEGATIVE
RBC, UA: NEGATIVE
Specific Gravity, UA: 1.023 (ref 1.005–1.030)
Urobilinogen, Ur: 1 mg/dL (ref 0.2–1.0)
pH, UA: 5.5 (ref 5.0–7.5)

## 2020-11-12 LAB — VIRAL HEPATITIS HBV, HCV
HCV Ab: <0.1 s/co ratio (ref 0.0–0.9)
Hep B Core Total Ab: NEGATIVE
Hep B Surface Ab, Qual: NONREACTIVE
Hepatitis B Surface Ag: NEGATIVE

## 2020-11-12 LAB — HCV INTERPRETATION

## 2020-11-12 LAB — ABO AND RH: Rh Factor: POSITIVE

## 2020-11-12 LAB — RPR: RPR Ser Ql: NONREACTIVE

## 2020-11-12 LAB — GC/CHLAMYDIA PROBE AMP
Chlamydia trachomatis, NAA: NEGATIVE
Neisseria Gonorrhoeae by PCR: NEGATIVE

## 2020-11-12 LAB — ANTIBODY SCREEN: Antibody Screen: NEGATIVE

## 2020-11-12 LAB — TOXOPLASMA ANTIBODIES- IGG AND  IGM
Toxoplasma Antibody- IgM: <3 AU/mL (ref 0.0–7.9)
Toxoplasma IgG Ratio: <3 IU/mL (ref 0.0–7.1)

## 2020-11-12 LAB — HGB SOLU + RFLX FRAC: Sickle Solubility Test - HGBRFX: NEGATIVE

## 2020-11-12 LAB — HEMOGLOBIN A1C
Est. average glucose Bld gHb Est-mCnc: 100 mg/dL
Hgb A1c MFr Bld: 5.1 % (ref 4.8–5.6)

## 2020-11-12 LAB — VARICELLA ZOSTER ANTIBODY, IGG: Varicella zoster IgG: <135 index — ABNORMAL LOW (ref >165)

## 2020-11-12 LAB — HIV ANTIBODY (ROUTINE TESTING W REFLEX): HIV Screen 4th Generation wRfx: NONREACTIVE

## 2020-11-12 LAB — RUBELLA SCREEN: Rubella Antibodies, IGG: 1.82 index (ref >0.99)

## 2020-11-13 LAB — CULTURE, OB URINE

## 2020-11-13 LAB — URINE CULTURE, OB REFLEX

## 2020-11-15 ENCOUNTER — Telehealth: Payer: Self-pay | Admitting: Obstetrics and Gynecology

## 2020-11-15 NOTE — Telephone Encounter (Signed)
NEW MESSAGE:   Pt is requesting drug test results be emailed to her.  She does not want to know the gender results and does not want to retrieve information from her MyChart.

## 2020-11-16 LAB — MATERNIT21 PLUS CORE+SCA
Fetal Fraction: 14
Monosomy X (Turner Syndrome): NOT DETECTED
Result (T21): NEGATIVE
Trisomy 13 (Patau syndrome): NEGATIVE
Trisomy 18 (Edwards syndrome): NEGATIVE
Trisomy 21 (Down syndrome): NEGATIVE
XXX (Triple X Syndrome): NOT DETECTED
XXY (Klinefelter Syndrome): NOT DETECTED
XYY (Jacobs Syndrome): NOT DETECTED

## 2020-11-16 NOTE — Progress Notes (Signed)
Per her request, can mail patient her results or she can have someone pick them up,

## 2020-11-17 NOTE — Telephone Encounter (Signed)
Pt called no answer LM via VM. I was calling to see which labs she wanted mail to her home all of her prenatal labs, or just the MaterniT21 or the prenatal labs and the MaterniT21.

## 2020-11-18 ENCOUNTER — Ambulatory Visit
Admission: RE | Admit: 2020-11-18 | Discharge: 2020-11-18 | Disposition: A | Discharge status: Home / Self Care | Payer: Medicaid Other | Source: Ambulatory Visit | Attending: Obstetrics and Gynecology | Admitting: Obstetrics and Gynecology

## 2020-11-18 ENCOUNTER — Inpatient Hospital Stay: Admit: 2020-11-18 | Payer: Medicaid Other

## 2020-11-18 ENCOUNTER — Other Ambulatory Visit: Payer: Self-pay

## 2020-11-18 DIAGNOSIS — Z3A32 32 weeks gestation of pregnancy: Secondary | ICD-10-CM | POA: Insufficient documentation

## 2020-11-18 DIAGNOSIS — O0933 Supervision of pregnancy with insufficient antenatal care, third trimester: Secondary | ICD-10-CM | POA: Insufficient documentation

## 2020-11-20 LAB — DRUG PROFILE, UR, 9 DRUGS (LABCORP)
Amphetamines, Urine: NEGATIVE ng/mL
Barbiturate Quant, Ur: NEGATIVE ng/mL
Benzodiazepine Quant, Ur: NEGATIVE ng/mL
Cannabinoid Quant, Ur: NEGATIVE
Cocaine (Metab.): NEGATIVE ng/mL
Methadone Screen, Urine: NEGATIVE ng/mL
Opiate Quant, Ur: NEGATIVE ng/mL
PCP Quant, Ur: NEGATIVE ng/mL
Propoxyphene: NEGATIVE ng/mL

## 2020-11-20 LAB — NICOTINE SCREEN, URINE: Cotinine Ql Scrn, Ur: NEGATIVE ng/mL

## 2020-11-22 NOTE — Telephone Encounter (Signed)
Pt called no answer LM via VM to please contact the office or reply via mychart to see which labs she wanted mailed to her.

## 2020-11-23 ENCOUNTER — Encounter: Payer: Self-pay | Admitting: Obstetrics and Gynecology

## 2020-11-24 SURGERY — Surgical Case
Anesthesia: *Unknown

## 2020-11-25 ENCOUNTER — Encounter: Payer: Medicaid Other | Admitting: Obstetrics and Gynecology

## 2020-11-29 ENCOUNTER — Encounter: Payer: Self-pay | Admitting: Obstetrics and Gynecology

## 2020-12-02 ENCOUNTER — Encounter: Payer: Medicaid Other | Admitting: Obstetrics and Gynecology

## 2020-12-07 ENCOUNTER — Other Ambulatory Visit: Payer: Self-pay

## 2020-12-07 ENCOUNTER — Ambulatory Visit (INDEPENDENT_AMBULATORY_CARE_PROVIDER_SITE_OTHER): Payer: Medicaid Other | Admitting: Obstetrics and Gynecology

## 2020-12-07 VITALS — BP 129/81 | HR 79 | Wt 155.7 lb

## 2020-12-07 DIAGNOSIS — Z3A35 35 weeks gestation of pregnancy: Secondary | ICD-10-CM

## 2020-12-07 DIAGNOSIS — Z3480 Encounter for supervision of other normal pregnancy, unspecified trimester: Secondary | ICD-10-CM

## 2020-12-07 LAB — POCT URINALYSIS DIPSTICK OB
Bilirubin, UA: NEGATIVE
Blood, UA: NEGATIVE
Glucose, UA: NEGATIVE
Ketones, UA: NEGATIVE
Leukocytes, UA: NEGATIVE
Nitrite, UA: NEGATIVE
POC,PROTEIN,UA: NEGATIVE
Spec Grav, UA: 1.01 (ref 1.010–1.025)
Urobilinogen, UA: 0.2 E.U./dL
pH, UA: 6 (ref 5.0–8.0)

## 2020-12-07 NOTE — Progress Notes (Signed)
ROB: Patient complains of pelvic discomfort inner thighs.  She states it is much worse with walking and movement.  Examination of the symphysis pubis -pain not reproduced.  Possible laxity of hip joints causing referred pain.  Discussed rest and Tylenol.  Referred to physical therapy for pelvic discomfort in pregnancy.  Patient declined cultures today says that she will do them next week.  Reports daily fetal movement.  Has occasional contractions but she says they are mostly Deberah Pelton.

## 2020-12-09 ENCOUNTER — Encounter: Payer: Self-pay | Admitting: Physical Therapy

## 2020-12-09 ENCOUNTER — Ambulatory Visit: Payer: Medicaid Other | Attending: Obstetrics and Gynecology | Admitting: Physical Therapy

## 2020-12-09 ENCOUNTER — Other Ambulatory Visit: Payer: Self-pay

## 2020-12-09 DIAGNOSIS — M533 Sacrococcygeal disorders, not elsewhere classified: Secondary | ICD-10-CM | POA: Diagnosis not present

## 2020-12-09 DIAGNOSIS — R2689 Other abnormalities of gait and mobility: Secondary | ICD-10-CM | POA: Diagnosis present

## 2020-12-09 NOTE — Patient Instructions (Signed)
  Clam Shell 45 Degrees  Lying with hips and knees bent 45, one pillow between knees and ankles. Heel together, toes apart like ballerina,  Lift knee with exhale while pressing heels together. Be sure pelvis does not roll backward. Do not arch back. Do 20 times, each leg, 2 times per day.     Complimentary stretch: Figure-4   3 breaths   __  Wear the belt all the time now and after delivery

## 2020-12-10 NOTE — Therapy (Signed)
[facility] MAIN [facility] SERVICES [address] [place], [place], [address] Phone: [phone]   Fax:  [phone]  Physical Therapy Evaluation  Patient Details  Name: Theresa Finley MRN: [id number] Date of Birth: 05-15-1991 Referring Provider (PT): Dr. Logan Bores   Encounter Date: 12/09/2020   PT End of Session - 12/09/20 1736    Visit Number 1    Number of Visits 6    Date for PT Re-Evaluation 01/20/21    PT Start Time 1611    PT Stop Time 1700    PT Time Calculation (min) 49 min    Activity Tolerance Patient tolerated treatment well    Behavior During Therapy [facility] for tasks assessed/performed           Past Medical History:  Diagnosis Date  . Medical history non-contributory   . Trichomonas infection 01/30/2018   Neg 12/15/17    Past Surgical History:  Procedure Laterality Date  . CAPSULAR RELEASE Right 10/15/2018   Procedure: Right elbow hardware removal, arthrotomy and synovectomy right elbow with capsular release;  Surgeon: Dominica Severin, MD;  Location: [facility];  Service: Orthopedics;  Laterality: Right;  2 hrs for both procedures  . HARDWARE REMOVAL Right 10/15/2018   Procedure: HARDWARE REMOVAL;  Surgeon: Dominica Severin, MD;  Location: [facility];  Service: Orthopedics;  Laterality: Right;  . ORIF RADIAL FRACTURE  07/27/2012   Procedure: OPEN REDUCTION INTERNAL FIXATION (ORIF) RADIAL FRACTURE;  Surgeon: Dominica Severin, MD;  Location: [facility];  Service: Orthopedics;  Laterality: Right;  ORIF Right Radial Head Fracture    There were no vitals filed for this visit.    Subjective Assessment - 12/09/20 1623    Subjective Pt is at 30 weeks of pregnancy with her 5th child. Pt reports she feels sharp pains inside the groin and sometimes she can barely get up. Pt tried to close her legs but it sometimes it helps, sometimes it does not. Pain radiates to inner thigh. Numbness feeling sometimes. Pt has not lost  sensation of urine and bowels. 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 Pt has difficulty with  household chores, walking in grocery store, getting in/out car  due this pain. Pain is at 8/10. Pain at its best 2/10. Pt had a fall on 08/18/20 and fell onto her tailbone. Pt had trouble walking and sitting. The tailbone pain got better. 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    Patient Stated Goals feel better, get loosen, fix sharp pains              [facility] PT Assessment - 12/10/20 1202      Assessment   Medical Diagnosis [redacted] week gestation    Referring Provider (PT) Dr. Logan Bores      Precautions   Precautions None      Restrictions   Weight Bearing Restrictions No      Balance Screen   Has the patient fallen in the past 6 months Yes    How many times? 1    Has the patient had a decrease in activity level because of a fear of falling?  No    Is the patient reluctant to leave their home because of a fear of falling?  No      Strength   Overall Strength Comments LLE 3+/5, RLE 4/5,  hip abd L 3/5 ( post Tx L BE 4/5)      Palpation   Palpation comment tenderness/ tightness at glut med/ minimi, L SIJ hypomobile,                      Objective measurements completed on examination: See above findings.       [facility] Adult PT Treatment/Exercise - 12/10/20 1200      Therapeutic Activites    Other Therapeutic Activities provided SIJ belt      Neuro Re-ed    Neuro Re-ed Details  cued for HEP body mechanics and hip abduction strengthening      Manual Therapy   Manual therapy comments , long axis distraction L LE, rotational iliac crest, STM/MWM at glut med in order to realign pelvic girdle                    PT Short Term Goals - 12/09/20 1632      PT SHORT TERM GOAL #1   Title Pt will demo equal alignment of pelvic girdle in order to walk and sit  to stand with pelvic stability    Baseline L iliac crest higher    Time 2    Period Weeks    Status New    Target Date 12/24/20      PT SHORT TERM GOAL #2   Title Pt will demo increased BLE strength to > 4/5 order to minimize pelvic pain and perform bending, getting out of car    Baseline LLE 3+/5, RLE 4/5,  hip abd B 3/5    Time 4    Period Weeks    Status New    Target Date 01/07/21      PT SHORT TERM GOAL #3   Title Pt will be IND with deep core exercises and body mechanics to minimize straining pelvic floor and back in order to perform ADLs    Time 6    Period Weeks    Status New    Target Date 01/21/21      PT SHORT TERM GOAL #4   Title PT will report no more numbness in inner thighs in order to promote nerve mobility and function    Time 4    Period Weeks    Status New    Target Date 01/07/21                     Plan - 12/10/20 1208    Clinical Impression Statement  Pt is a  30 yo  who is [redacted] weeks pregnant with her 5th child and is experiencing pelvic girdle pain with sharp pains in her groin and intermittent numbness at innter thigh regions. Pt has Hx of CLP.   Pt's musculoskeletal assessment revealed pelvic obliquities, limited spinal /pelvic mobility, weak hip weakness, poor body mechanics which places strain on the abdominal/pelvic floor mm.   These are deficits that indicate an ineffective intraabdominal pressure system associated with increased risk for pt's Sx.   Pt was provided education on etiology of Sx with anatomy, physiology explanation with images along with the benefits of customized pelvic PT Tx based on pt's medical conditions and musculoskeletal deficits.  Explained the physiology of deep core mm coordination and roles of pelvic floor function in urination, defecation, sexual function, and postural control with deep core mm system.   Following Tx today which pt tolerated without complaints, pt demo'd equal alignment of pelvic girdle and  decreased pain with walking, sit to stand. Pt demo'd IND with body mechanics that help increase pelvic girdle stability.      Examination-Activity Limitations Transfers;Lift;Locomotion Level;Sit;Caring for Others;Squat;Carry    Stability/Clinical Decision Making Evolving/Moderate complexity    Clinical Decision Making Low    Rehab Potential Good    PT Frequency 1x / week    PT Duration Other (comment)    PT Treatment/Interventions Therapeutic activities;Therapeutic exercise;Neuromuscular re-education;Patient/family education;Functional mobility training;Moist Heat;Cryotherapy;Stair training;Manual techniques;Taping;Scar mobilization;Spinal Manipulations;Gait training    Consulted and Agree with Plan of Care Patient           Patient will benefit from skilled therapeutic intervention in order to improve the following deficits and impairments:  Decreased balance,Decreased strength,Decreased mobility,Decreased activity tolerance,Decreased endurance,Decreased coordination,Abnormal gait,Hypermobility,Difficulty walking,Hypomobility,Impaired sensation,Postural dysfunction,Improper body mechanics,Decreased safety awareness,Increased muscle spasms  Visit Diagnosis: Sacrococcygeal disorders, not elsewhere classified  Other abnormalities of gait and mobility     Problem List Patient Active Problem List   Diagnosis Date Noted  . Normal labor 10/12/2019  . Smoking 08/03/2019  . Labor and delivery, indication for care 03/08/2018  . Supervision of low-risk pregnancy 10/22/2017    Mariane Masters ,PT, DPT, E-RYT  12/10/2020, 12:09 PM  [facility] MAIN [facility] SERVICES [address] [place], [place], [address] Phone: [phone]   Fax:  [phone]  Name: Theresa Finley MRN: [id number] Date of Birth: Apr 18, 1991

## 2020-12-14 ENCOUNTER — Encounter: Payer: Self-pay | Admitting: Obstetrics and Gynecology

## 2020-12-14 ENCOUNTER — Ambulatory Visit (INDEPENDENT_AMBULATORY_CARE_PROVIDER_SITE_OTHER): Payer: Medicaid Other | Admitting: Obstetrics and Gynecology

## 2020-12-14 ENCOUNTER — Other Ambulatory Visit: Payer: Self-pay

## 2020-12-14 VITALS — BP 129/80 | HR 71 | Wt 157.5 lb

## 2020-12-14 DIAGNOSIS — O99613 Diseases of the digestive system complicating pregnancy, third trimester: Secondary | ICD-10-CM

## 2020-12-14 DIAGNOSIS — Z3483 Encounter for supervision of other normal pregnancy, third trimester: Secondary | ICD-10-CM

## 2020-12-14 DIAGNOSIS — Z3A36 36 weeks gestation of pregnancy: Secondary | ICD-10-CM

## 2020-12-14 DIAGNOSIS — K219 Gastro-esophageal reflux disease without esophagitis: Secondary | ICD-10-CM

## 2020-12-14 DIAGNOSIS — Z3685 Encounter for antenatal screening for Streptococcus B: Secondary | ICD-10-CM

## 2020-12-14 LAB — POCT URINALYSIS DIPSTICK OB
Bilirubin, UA: NEGATIVE
Blood, UA: NEGATIVE
Glucose, UA: NEGATIVE
Ketones, UA: NEGATIVE
Leukocytes, UA: NEGATIVE
Nitrite, UA: NEGATIVE
POC,PROTEIN,UA: NEGATIVE
Spec Grav, UA: <=1.005 — AB (ref 1.010–1.025)
Urobilinogen, UA: 0.2 E.U./dL
pH, UA: 7 (ref 5.0–8.0)

## 2020-12-14 MED ORDER — PANTOPRAZOLE SODIUM 20 MG PO TBEC: 20.0000 mg | DELAYED_RELEASE_TABLET | Freq: Every day | ORAL | 2 refills | Status: DC

## 2020-12-14 NOTE — Progress Notes (Signed)
OB-Pt present for routine prenatal care and 36 week cultures. Pt stated having swollen hands and vaginal pain and pressure.

## 2020-12-14 NOTE — Patient Instructions (Signed)

## 2020-12-14 NOTE — Progress Notes (Signed)
ROB: Patient complains of more pelvic pressure and vaginal pain. Is wearing belly band. Given info on [employer]. For 36 week cultures. Discussed pain management in labor.  Patient notes she would prefer intermittent monitoring or the [facility] monitors in labor (desires to move as much as possible). Also complains of reflux not resolved with Tums. Given prescription for Protonix.  RTC in 1 week.

## 2020-12-17 LAB — STREP GP B NAA: Strep Gp B NAA: NEGATIVE

## 2020-12-18 LAB — GC/CHLAMYDIA PROBE AMP
Chlamydia trachomatis, NAA: NEGATIVE
Neisseria Gonorrhoeae by PCR: NEGATIVE

## 2020-12-21 ENCOUNTER — Encounter: Payer: Medicaid Other | Admitting: Obstetrics and Gynecology

## 2020-12-23 ENCOUNTER — Telehealth: Payer: Self-pay | Admitting: Obstetrics and Gynecology

## 2020-12-23 NOTE — Telephone Encounter (Signed)
Patient called and stated that she is in so much pain she can barely walk.  She states that she has been going to therapy but it doesn't seem to help.  Should she go to the ER?  Please Advise

## 2020-12-23 NOTE — Telephone Encounter (Signed)
Pt called no answer LM via VM advising pt to go to the ED.

## 2020-12-24 ENCOUNTER — Ambulatory Visit: Payer: Medicaid Other | Admitting: Physical Therapy

## 2020-12-24 NOTE — Telephone Encounter (Signed)
Pt called no answer LM via Vm to call the office to schedule an appointment or go to the ED.

## 2020-12-28 ENCOUNTER — Observation Stay
Admission: EM | Admit: 2020-12-28 | Discharge: 2020-12-29 | Disposition: A | Discharge status: Home / Self Care | Payer: Medicaid Other | Attending: Obstetrics and Gynecology | Admitting: Obstetrics and Gynecology

## 2020-12-28 ENCOUNTER — Encounter: Payer: Self-pay | Admitting: Obstetrics and Gynecology

## 2020-12-28 ENCOUNTER — Other Ambulatory Visit: Payer: Self-pay

## 2020-12-28 ENCOUNTER — Encounter: Payer: Medicaid Other | Admitting: Obstetrics and Gynecology

## 2020-12-28 DIAGNOSIS — Z3A39 39 weeks gestation of pregnancy: Secondary | ICD-10-CM | POA: Insufficient documentation

## 2020-12-28 DIAGNOSIS — O26893 Other specified pregnancy related conditions, third trimester: Principal | ICD-10-CM | POA: Insufficient documentation

## 2020-12-28 DIAGNOSIS — O99613 Diseases of the digestive system complicating pregnancy, third trimester: Secondary | ICD-10-CM | POA: Diagnosis not present

## 2020-12-28 DIAGNOSIS — K219 Gastro-esophageal reflux disease without esophagitis: Secondary | ICD-10-CM | POA: Diagnosis not present

## 2020-12-28 DIAGNOSIS — Z3403 Encounter for supervision of normal first pregnancy, third trimester: Secondary | ICD-10-CM

## 2020-12-28 DIAGNOSIS — R102 Pelvic and perineal pain: Secondary | ICD-10-CM | POA: Diagnosis not present

## 2020-12-28 DIAGNOSIS — Z3A38 38 weeks gestation of pregnancy: Secondary | ICD-10-CM

## 2020-12-28 MED ORDER — OXYCODONE-ACETAMINOPHEN 5-325 MG PO TABS
1.0000 | ORAL_TABLET | Freq: Four times a day (QID) | ORAL | Status: DC | PRN
  Administered 2020-12-29 (×4): 2 via ORAL
  Filled 2020-12-28 (×4): qty 2

## 2020-12-28 NOTE — OB Triage Note (Signed)
Pt G6P4 [redacted]w[redacted]d presents to bp w/ c/o pelvic pain started around 9am and has progressively worse. States this pain started weeks ago and pt had been going to physical therapy. Pain has gotten worse to point where pt cannot get up or lift legs by self. States it is hard to get out of bed by self. VSS. Monitors applied and assessing. FHT's in 130's.

## 2020-12-29 ENCOUNTER — Encounter: Payer: Self-pay | Admitting: Obstetrics and Gynecology

## 2020-12-29 DIAGNOSIS — R102 Pelvic and perineal pain: Secondary | ICD-10-CM

## 2020-12-29 DIAGNOSIS — Z3A39 39 weeks gestation of pregnancy: Secondary | ICD-10-CM

## 2020-12-29 DIAGNOSIS — O26893 Other specified pregnancy related conditions, third trimester: Secondary | ICD-10-CM | POA: Diagnosis not present

## 2020-12-29 DIAGNOSIS — O99891 Other specified diseases and conditions complicating pregnancy: Secondary | ICD-10-CM | POA: Diagnosis not present

## 2020-12-29 MED ORDER — OXYCODONE-ACETAMINOPHEN 5-325 MG PO TABS: 1.0000 | ORAL_TABLET | Freq: Four times a day (QID) | ORAL | 0 refills | Status: DC | PRN

## 2020-12-29 MED ORDER — CYCLOBENZAPRINE HCL 10 MG PO TABS
10.0000 mg | ORAL_TABLET | Freq: Three times a day (TID) | ORAL | Status: DC | PRN
  Administered 2020-12-29 (×3): 10 mg via ORAL
  Filled 2020-12-29 (×5): qty 1

## 2020-12-29 MED ORDER — ZOLPIDEM TARTRATE 5 MG PO TABS: 5.0000 mg | ORAL_TABLET | Freq: Every evening | ORAL | 0 refills | Status: DC | PRN

## 2020-12-29 MED ORDER — ZOLPIDEM TARTRATE 5 MG PO TABS
5.0000 mg | ORAL_TABLET | Freq: Every evening | ORAL | Status: DC | PRN
  Administered 2020-12-29 (×2): 5 mg via ORAL
  Filled 2020-12-29 (×2): qty 1

## 2020-12-29 NOTE — Progress Notes (Signed)
Pt called out to nursing station requesting to eat breakfast. Dr. Valentino Saxon to the bedside to evaluate and touch base with the patient. Physical therapy ordered to see patient. Verbal orders given for pt to have regular diet. RN to give pt a menu and put her room phone within reach. VSS this morning. Will continue to monitor.

## 2020-12-29 NOTE — Progress Notes (Signed)
Patient ID: Theresa Finley, female   DOB: 1991-02-15, 30 y.o.   MRN: [id number]  Telephone call from Dr. Logan Bores, CNM asked to see patient regarding use of non-pharmacologic techniques to reduced pelvic pain during pregnancy.  S: Patient resting quietly in bed with eyes closed, easily awaken. Husband present at bedside for continuous support.   Denies vaginal bleeding or leakage of fluid.   O: Temp:  [97.9 F (36.6 C)-98.6 F (37 C)] 98.3 F (36.8 C) (06/08 1554) Pulse Rate:  [61-91] 77 (06/08 1554) Resp:  [18] 18 (06/08 1554) BP: (109-157)/(60-85) 134/83 (06/08 1554)  Dilation: 1 Effacement (%): 40,50 Cervical Position: Posterior Station: -3 Exam by:: M.Javid Kemler, CNM  A: 30 year old female G6P4 at 39 weeks 0 days, pelvic pain in pregnancy, Rh positive, GBS negative  P: Consent obtained for vaginal exam. Discussed fetal position as likely reason for discomfort. Offered [employer] and [employer] and patient agrees. RN will complete [facility] [employer] and [employer] with patient, modify as needed; will call Dr. Logan Bores will update once completed and NST obtained.     Theresa Finley, CNM [facility], [facility] 12/29/20 4:42 PM

## 2020-12-29 NOTE — Final Progress Note (Signed)
L&D OB Triage Note  HPI:  Theresa Finley is a 30 y.o. [id number] female at [redacted]w[redacted]d. Estimated Date of Delivery: 01/05/21 who was admitted overnight for observation due to persistent pelvic pain.  She reports that she has vaginal, groin, and suprapubic pain that has been ongoing for several weeks. Nothing has helped her pain, including Tylenol, use of belly band, pregnancy exercises, or physical therapy (notes this helps for a short time but does not last long).  States that her pain is interfering with her ADLs, as she has difficulty ambulating to the restroom, standing for longer than several minutes, lifting her legs, and makes it difficult to take care of her other 4 children. She currently denies contractions, LOF, and reports good fetal movement.    OB History  Gravida Para Term Preterm AB Living  6 4 4  0 1 4  SAB IAB Ectopic Multiple Live Births  1 0 0 0 4    # Outcome Date GA Lbr Len/2nd Weight Sex Delivery Anes PTL Lv  6 Current           5 Term 10/13/19 [redacted]w[redacted]d / 00:04 2400 g F Vag-Spont   LIV  4 Term 03/08/18 [redacted]w[redacted]d 09:10 / 00:13 2977 g F Vag-Spont EPI  LIV  3 SAB 03/24/17 [redacted]w[redacted]d            Birth Comments: per patient had 3 home upt's positive , but did not go to doctor and had a lot of bleeding  2 Term 05/25/14 [redacted]w[redacted]d 04:58 / 00:14 2466 g F Vag-Spont EPI  LIV  1 Term 2012 [redacted]w[redacted]d  3118 g  Vag-Spont EPI  LIV     Birth Comments: no complications    Patient Active Problem List   Diagnosis Date Noted  . Pelvic pressure in pregnancy, antepartum, third trimester 12/28/2020  . Gastroesophageal reflux in pregnancy in third trimester 12/14/2020  . Normal labor 10/12/2019  . Smoking 08/03/2019  . Labor and delivery, indication for care 03/08/2018  . Supervision of low-risk pregnancy 10/22/2017    Past Medical History:  Diagnosis Date  . Medical history non-contributory   . Trichomonas infection 01/30/2018   Neg 12/15/17    No current facility-administered medications on file prior to  encounter.   Current Outpatient Medications on File Prior to Encounter  Medication Sig Dispense Refill  . acetaminophen (TYLENOL) 325 MG tablet Take 650 mg by mouth every 6 (six) hours as needed.    . pantoprazole (PROTONIX) 20 MG tablet Take 1 tablet (20 mg total) by mouth daily. 60 tablet 2  . Prenatal Vit-Fe Fumarate-FA (PRENATAL MULTIVITAMIN) TABS tablet Take 1 tablet by mouth daily at 12 noon.      No Known Allergies   ROS:  Review of Systems - Negative except what is noted in HPI.   Physical Exam:  Blood pressure 109/60, pulse 91, temperature 98.2 F (36.8 C), temperature source Oral, resp. rate 18, unknown if currently breastfeeding. General appearance: alert and mild distress Abdomen: soft, mildly tender in lower abdomen.  Gravid.  Pelvic: attempted cervical exam by nurse but discontinued due to pain.  Extremities: extremities normal, atraumatic, no cyanosis or edema   NST INTERPRETATION: Indications: rule out uterine contractions  Mode: External Baseline Rate (A): 135 bpm Variability: Moderate Accelerations: 15 x 15 Decelerations: None     Contraction Frequency (min): occasional  Impression: reactive   Assessment:  30 y.o. 37 at [redacted]w[redacted]d with:  1.  Pelvic pain in pregnancy    Plan:  1.  Attempted to manage pain with Percocet and Flexeril, however patient notes this did not do anything for her pain. Was given Ambien overnight to help her rest.  2. PT referral placed this morning to see if there can be additional assistance for patient's pain. Awaiting assessment.   Patient signed out to Dr. Brennan Bailey.   Hildred Laser, MD [facility]

## 2020-12-29 NOTE — Progress Notes (Signed)
   Theresa Finley, CNM at bedside at 1545 to provide education on spinning babies position to help alleviate pelvic pain. CNM performed SVE and charted in flowsheets. Pt did not seem interested in position changes and asked to stop when RN attempted position changes. Pt had the most pain relief with forward leaning inversion. Pt did not feel receptive to physical therapy and declined a walker. RN spoke with Dr.Evans regarding pt's current condition. Pt is not in labor, still reporting pelvic pain 10/10 and requesting pain medicine to manage pain with little to no pain relief. At 1800 Theresa Saxon, MD was in department at the time and requested for pt to make follow-up appointment with Dr.Cherry on 12/31/2020 at 1615 for ROB visit where they will discuss pelvic pain management.  RN at bedside to talk with patient regarding discharge. Pt states, "I don't think it's safe for me to go home when I am in this much pain, this is causing me stress and the baby. And how do you want me to go to the appointment when I can't even get in the car from this pain. I want to talk to a doctor now." Pt refused talking to the RN until she speaks with her providers. Evans,MD notified and will come to bedside to speak with pt per her request. RN notified Logan Bores, MD of pt's discharge refusal and request to speak with him.   1840Logan Bores, MD at bedside to discuss discharge vs staying overnight. Pt states, "I rather go home if you won't put me in labor." Pt agreed to discharge home with pain medicine. Logan Bores, MD placing orders for d/c.

## 2020-12-29 NOTE — Evaluation (Signed)
Physical Therapy Evaluation Patient Details Name: Theresa Finley MRN: [id number] DOB: 02-27-1991 Today's Date: 12/29/2020   History of Present Illness  Per H&P, pt "is a 30 y.o. [id number] female at [redacted]w[redacted]d. Estimated Date of Delivery: 01/05/21 who was admitted overnight for observation due to persistent pelvic pain.  She reports that she has vaginal, groin, and suprapubic pain that has been ongoing for several weeks. Nothing has helped her pain, including Tylenol, use of belly band, pregnancy exercises, or physical therapy (notes this helps for a short time but does not last long).  States that her pain is interfering with her ADLs, as she has difficulty ambulating to the restroom, standing for longer than several minutes, lifting her legs, and makes it difficult to take care of her other 4 children".  Clinical Impression  Prior to hospital admission, pt had seen OP PT d/t pelvic pain and difficulty with mobility (was issued SI belt in OP PT that pt reports she has been using all the time except when in bed but did not bring to the hospital); lives with her husband and 4 children (2 STE no railing; bedroom 2nd floor--pt reports unable to sleep on couch on main floor and needed to go to 2nd floor bedroom to sleep).  Therapist attempted to ask pt questions regarding symptoms, mobility, and home set-up in order to assist with determining pt's needs and to help with assessment but pt verbalizing frustration with therapists questions and stated that she thought therapist was coming to give her a massage (pt reports her husband has been giving her back massages like the OP PT did).  Pt requesting to use bathroom to toilet so therapist assisted pt with bed mobility, transfers, and walking short distance to bathroom and back to bed.  Discussed positioning and techniques to improve pelvic stability during mobility--pt reported she was utilizing these techniques already given to her by OP PT and did the ones that worked  for her.  Trialed RW but after 5 feet pt reporting she did not feel comfortable with using RW and preferred to hold onto furniture, door frame, and grab bar in bathroom for stability instead rest of ambulation.  Therapist encouraged pt to use SI belt (that pt was already issued in OP PT) with mobility to improve pelvic pain.  Pt tender to B low back area but reporting the worst pain in pubic symphysis area (sharp shooting).  Pt reporting she did not feel safe discharging home because she had 4 children at home and her husband had too much to manage between assisting her and her children; pt also reporting she tends to deliver quickly and was concerned about getting out of the home quickly enough to get to the hospital.  Pt stating that she just wanted to get induced and wanted MD to come talk with her: nurse notified.  Above information/concerns/sessions activities discussed with pt's nurse and pt's nurse reported she would talk with MD.  Pt would currently require 24/7 assist upon hospital discharge.  Pt declined RW use (as noted above).  D/t pt having difficulty ambulating longer distances, a manual w/c and BSC may be beneficial for short term use if pt agreeable.    Follow Up Recommendations Other (comment) (Follow-up with MD postpartum for further PT needs)    Equipment Recommendations  Other (comment);3in1 (PT);Wheelchair (measurements PT);Wheelchair cushion (measurements PT) (pt declined RW)    Recommendations for Other Services       Precautions / Restrictions Precautions Precautions: None Restrictions Weight  Bearing Restrictions: No      Mobility  Bed Mobility Overal bed mobility: Needs Assistance Bed Mobility: Sidelying to Sit;Sit to Sidelying   Sidelying to sit: Mod assist;HOB elevated     Sit to sidelying: Mod assist;HOB elevated General bed mobility comments: assist for B LE's (keeping knees together); increased effort to perform rest on own d/t pelvic pain     Transfers Overall transfer level: Needs assistance Equipment used: 1 person hand held assist Transfers: Sit to/from Stand Sit to Stand: Min assist;Mod assist         General transfer comment: pt requesting to hold onto therapists arm to stand from bed (other UE pushing up on bed rail); increased effort to stand; vc's for transverse abdominal contraction for core/pelvic stability with transfers  Ambulation/Gait Ambulation/Gait assistance: Min guard Gait Distance (Feet):  (10 feet x2 to/from bathroom) Assistive device: Rolling walker (2 wheeled);None   Gait velocity: decreased   General Gait Details: pt trialed RW x5 feet (vc's for technique/walker use) but pt reporting she did not feel comfortable using RW and preferred to hold onto furniture/doorframe instead rest of ambulation to/from bathroom; decreased B LE step length/foot clearance; mild flexed posture at hips and knees  Stairs            Wheelchair Mobility    Modified Rankin (Stroke Patients Only)       Balance Overall balance assessment: Needs assistance Sitting-balance support: No upper extremity supported;Feet supported Sitting balance-Leahy Scale: Good Sitting balance - Comments: steady sitting reaching within BOS   Standing balance support: No upper extremity supported   Standing balance comment: steady static standing in mild flexed posture                             Pertinent Vitals/Pain Pain Assessment: 0-10 Pain Score: 10-Worst pain ever Pain Location: pubic symphysis area/pelvic pain Pain Descriptors / Indicators: Sharp;Shooting Pain Intervention(s): Limited activity within patient's tolerance;Monitored during session;Repositioned;Patient requesting pain meds-RN notified    Home Living Family/patient expects to be discharged to:: Private residence Living Arrangements: Spouse/significant other;Children (4 children) Available Help at Discharge: Family Type of Home: House Home  Access: Stairs to enter Entrance Stairs-Rails: None Secretary/administrator of Steps: 2 Home Layout: Two level;Bed/bath upstairs Home Equipment: Other (comment) (SI belt)      Prior Function Level of Independence: Independent         Comments: Pt reports prior to coming to the hospital she started having more difficulty with getting OOB, with transfers, and with walking requiring the assistance of her husband.  Husband was also giving pt back massages to help with back pain.     Hand Dominance        Extremity/Trunk Assessment   Upper Extremity Assessment Upper Extremity Assessment: Overall WFL for tasks assessed    Lower Extremity Assessment Lower Extremity Assessment: RLE deficits/detail;LLE deficits/detail RLE Deficits / Details: at least 3/5 AROM knee flexion/extension and DF/PF RLE: Unable to fully assess due to pain LLE: Unable to fully assess due to pain    Cervical / Trunk Assessment Cervical / Trunk Assessment: Normal  Communication   Communication: No difficulties  Cognition Arousal/Alertness: Awake/alert Behavior During Therapy:  (pt reported being frustrated beginning of session with therapists questions) Overall Cognitive Status: Within Functional Limits for tasks assessed  General Comments   Nursing cleared pt for participation in physical therapy.  Pt agreeable to PT session.  Pt reports unable to perform OP PT ex's anymore d/t pelvic pain.  Difficult to assess pelvic alignment d/t pt unable to get in good positions for accurate assessment.    Exercises     Assessment/Plan    PT Assessment Patient needs continued PT services  PT Problem List Decreased strength;Decreased activity tolerance;Decreased balance;Decreased mobility;Decreased knowledge of use of DME;Pain       PT Treatment Interventions DME instruction;Gait training;Stair training;Functional mobility training;Therapeutic  activities;Therapeutic exercise;Balance training;Patient/family education    PT Goals (Current goals can be found in the Care Plan section)  Acute Rehab PT Goals Patient Stated Goal: to improve pain and mobility PT Goal Formulation: With patient Time For Goal Achievement: 01/12/21 Potential to Achieve Goals: Fair    Frequency Min 2X/week   Barriers to discharge Decreased caregiver support      Co-evaluation               AM-PAC PT "6 Clicks" Mobility  Outcome Measure Help needed turning from your back to your side while in a flat bed without using bedrails?: A Little Help needed moving from lying on your back to sitting on the side of a flat bed without using bedrails?: A Lot Help needed moving to and from a bed to a chair (including a wheelchair)?: A Little Help needed standing up from a chair using your arms (e.g., wheelchair or bedside chair)?: A Lot Help needed to walk in hospital room?: A Little Help needed climbing 3-5 steps with a railing? : A Lot 6 Click Score: 15    End of Session   Activity Tolerance: Patient limited by pain Patient left: in bed;with call bell/phone within reach Nurse Communication: Mobility status;Precautions;Patient requests pain meds PT Visit Diagnosis: Other abnormalities of gait and mobility (R26.89);Muscle weakness (generalized) (M62.81);Difficulty in walking, not elsewhere classified (R26.2)    Time: 7341-9379 PT Time Calculation (min) (ACUTE ONLY): 37 min   Charges:   PT Evaluation $PT Eval Low Complexity: 1 Low PT Treatments $Therapeutic Activity: 8-22 mins       Hendricks Limes, PT 12/29/20, 2:39 PM

## 2020-12-29 NOTE — Discharge Summary (Signed)
L&D OB Triage Note  SUBJECTIVE Theresa Finley is a 30 y.o. [id number] female at [redacted]w[redacted]d, EDD Estimated Date of Delivery: 01/05/21 who presented to triage with complaints of constant pelvic pain. Denies contractions, bleeding and leakage of fluid. Pt has had pelvic pain issues throughout pregnancy and has seen outpt PT.  OB History  Gravida Para Term Preterm AB Living  6 4 4  0 1 4  SAB IAB Ectopic Multiple Live Births  1 0 0 0 4    # Outcome Date GA Lbr Len/2nd Weight Sex Delivery Anes PTL Lv  6 Current           5 Term 10/13/19 [redacted]w[redacted]d / 00:04 2400 g F Vag-Spont   LIV     Name: KAHLEE, METIVIER     Apgar1: 8  Apgar5: 9  4 Term 03/08/18 [redacted]w[redacted]d 09:10 / 00:13 2977 g F Vag-Spont EPI  LIV     Name: [redacted]w[redacted]d     Apgar1: 9  Apgar5: 9  3 SAB 03/24/17 [redacted]w[redacted]d            Birth Comments: per patient had 3 home upt's positive , but did not go to doctor and had a lot of bleeding  2 Term 05/25/14 [redacted]w[redacted]d 04:58 / 00:14 2466 g F Vag-Spont EPI  LIV     Name: Haydel,GIRL Zakyia     Apgar1: 9  Apgar5: 9  1 Term 2012 [redacted]w[redacted]d  3118 g  Vag-Spont EPI  LIV     Birth Comments: no complications    Medications Prior to Admission  Medication Sig Dispense Refill Last Dose  . acetaminophen (TYLENOL) 325 MG tablet Take 650 mg by mouth every 6 (six) hours as needed.   12/28/2020 at Unknown time  . pantoprazole (PROTONIX) 20 MG tablet Take 1 tablet (20 mg total) by mouth daily. 60 tablet 2 12/27/2020 at Unknown time  . Prenatal Vit-Fe Fumarate-FA (PRENATAL MULTIVITAMIN) TABS tablet Take 1 tablet by mouth daily at 12 noon.   12/27/2020 at Unknown time     OBJECTIVE  Nursing Evaluation:   BP 134/83 (BP Location: Left Arm)   Pulse 77   Temp 98.3 F (36.8 C) (Oral)   Resp 18   LMP  (LMP Unknown)    Findings:        Pt not in labor.     Reassuring Fetal monitoring  Pt was given a sleep aid last night, flexeril and narcotic pain medications and has reported no relief through the day and no change in her pain  levels.  Pt refused pelvic exams most of the day. I consulted PT - see note.  I asked one of our CNM to see her and see if [facility] circuit or other positioning could help.  She did allow a vaginal exam which revealed an unfavorable cervix. Pt then said she could not tolerate the exercises/positions to try to manage pelvic discomfort.  Declined walker for mobility. Pt has repeatedly requested induction.   I am unwilling to do an induction of labor at this time.  With an unfavorable cervix and her dates being uncertain because of late [facility] dating - I think elective induction is unwarrented.   I have discussed all of the above repeatedly with her.  I have answered all of her questions.  I have offerred to let haer stay overnight tonight and continue to manage her pain and perhaps in the morning her pain will have improved.  She declined this option.  NST was performed and has been reviewed by me.  NST INTERPRETATION: Category I  Mode: External Baseline Rate (A): 125 bpm Variability: Moderate Accelerations: 15 x 15 Decelerations: None     Contraction Frequency (min): none noted  ASSESSMENT Impression:  1.  Pregnancy:  [id number] at [redacted]w[redacted]d , EDD Estimated Date of Delivery: 01/05/21 2.  Reassuring fetal and maternal status 3.  Pelvic pain/discomfort   PLAN 1. Discussed current condition and above findings with patient and reassurance given.  All questions answered. 2. Discharge home with standard labor precautions given to return to L&D or call the office for problems. 3. Continue routine prenatal care. 4. Pt asked to schedule an appointment early next week    I spent 65 minutes involved in the care of this patient preparing to see the patient by obtaining and reviewing her medical history (including labs, imaging tests and prior procedures), documenting clinical information in the electronic health record (EHR), counseling and coordinating care plans, writing and sending prescriptions, ordering  tests or procedures and directly communicating with the patient by discussing pertinent items from her history and physical exam as well as detailing my assessment and plan as noted above so that she has an informed understanding.  All of her questions were answered.

## 2020-12-30 ENCOUNTER — Other Ambulatory Visit: Payer: Self-pay | Admitting: Obstetrics and Gynecology

## 2020-12-30 ENCOUNTER — Telehealth: Payer: Self-pay | Admitting: Obstetrics and Gynecology

## 2020-12-30 DIAGNOSIS — O26893 Other specified pregnancy related conditions, third trimester: Secondary | ICD-10-CM

## 2020-12-30 MED ORDER — OXYCODONE-ACETAMINOPHEN 5-325 MG PO TABS: 1.0000 | ORAL_TABLET | ORAL | 0 refills | Status: DC | PRN

## 2020-12-30 NOTE — Telephone Encounter (Signed)
Please send to the new pharmacy.

## 2020-12-30 NOTE — Telephone Encounter (Signed)
Theresa Finley called in and states she went to [facility] to pick up her Percocet and [facility] denied filling it because they are not her primary pharmacy.  They gave her the ambiem but told her that the narcotic had to be filled at the pharmacy she always uses.   Theresa Finley would like the percocet sent to [facility] on [address]  Store # [phone].  She would also like a phone call when this is sent in.

## 2020-12-30 NOTE — Telephone Encounter (Signed)
Please call pharmacy to discuss a medication that was called in for patient.

## 2020-12-30 NOTE — Telephone Encounter (Signed)
Notified patient that prescription was sent to the pharmacy.

## 2020-12-30 NOTE — Telephone Encounter (Signed)
When I called the pharmacy about the prescription they stated that they could not fill due to Dr. Valentino Saxon not prescribing the RX. I explained that they both see the patient due to her being pregnant. Dr. Logan Bores is the provider that saw the patient therefore he prescribed the medication. They was [place] to get it to go through once I explained.

## 2020-12-31 ENCOUNTER — Telehealth: Payer: Self-pay | Admitting: Obstetrics and Gynecology

## 2020-12-31 ENCOUNTER — Encounter: Payer: Self-pay | Admitting: Obstetrics and Gynecology

## 2020-12-31 ENCOUNTER — Inpatient Hospital Stay
Admission: EM | Admit: 2020-12-31 | Discharge: 2021-01-03 | DRG: 807 | Disposition: A | Discharge status: Home / Self Care | Payer: Medicaid Other | Attending: Obstetrics and Gynecology | Admitting: Obstetrics and Gynecology

## 2020-12-31 ENCOUNTER — Ambulatory Visit (INDEPENDENT_AMBULATORY_CARE_PROVIDER_SITE_OTHER): Payer: Medicaid Other | Admitting: Obstetrics and Gynecology

## 2020-12-31 ENCOUNTER — Other Ambulatory Visit: Payer: Self-pay

## 2020-12-31 ENCOUNTER — Ambulatory Visit: Payer: Medicaid Other | Admitting: Physical Therapy

## 2020-12-31 VITALS — BP 138/84 | HR 97 | Wt 163.6 lb

## 2020-12-31 DIAGNOSIS — R102 Pelvic and perineal pain: Secondary | ICD-10-CM

## 2020-12-31 DIAGNOSIS — R030 Elevated blood-pressure reading, without diagnosis of hypertension: Secondary | ICD-10-CM

## 2020-12-31 DIAGNOSIS — Z3483 Encounter for supervision of other normal pregnancy, third trimester: Secondary | ICD-10-CM

## 2020-12-31 DIAGNOSIS — Z3A39 39 weeks gestation of pregnancy: Secondary | ICD-10-CM

## 2020-12-31 DIAGNOSIS — F1210 Cannabis abuse, uncomplicated: Secondary | ICD-10-CM | POA: Diagnosis present

## 2020-12-31 DIAGNOSIS — Z8759 Personal history of other complications of pregnancy, childbirth and the puerperium: Secondary | ICD-10-CM

## 2020-12-31 DIAGNOSIS — Z87891 Personal history of nicotine dependence: Secondary | ICD-10-CM

## 2020-12-31 DIAGNOSIS — O26893 Other specified pregnancy related conditions, third trimester: Secondary | ICD-10-CM

## 2020-12-31 DIAGNOSIS — O0930 Supervision of pregnancy with insufficient antenatal care, unspecified trimester: Secondary | ICD-10-CM

## 2020-12-31 DIAGNOSIS — O99324 Drug use complicating childbirth: Principal | ICD-10-CM | POA: Diagnosis present

## 2020-12-31 LAB — POCT URINALYSIS DIPSTICK OB
Bilirubin, UA: NEGATIVE
Blood, UA: NEGATIVE
Glucose, UA: NEGATIVE
Ketones, UA: NEGATIVE
Nitrite, UA: NEGATIVE
Spec Grav, UA: 1.01 (ref 1.010–1.025)
Urobilinogen, UA: 0.2 E.U./dL
pH, UA: 6 (ref 5.0–8.0)

## 2020-12-31 NOTE — Progress Notes (Signed)
ROB: Patient presents for f/u after recent admission for observation for severe pelvic pain in pregnancy several days ago.  She notes that she has been taking the medication which has helped (especially the sleeping medication).  Feels like the baby has moved some and has less intense pain and pressure in her pelvis. Does still feel like fetus is sitting on a nerve as she does still note some difficulty lifting her leg. Cervix more favorable on exam today (3/50/-2, previously 1/thick several days ago).  Offered membrane sweeping which patient agrees to.  Of note, patient with elevated BP of 150/90, also c/o swollen feet, trace pitting edema noted. Repeat BP 138/84. Patient may be developing some GHTN at term.  Discussed findings with patient. If no labor within 24-48 hrs, may need to return for BP check and possible IOL at that time on Monday if BPs elevated.    [facility] 12/31/2020 5:24 PM

## 2020-12-31 NOTE — Telephone Encounter (Signed)
Pt was seen in the office today at 4:15pm.

## 2020-12-31 NOTE — Telephone Encounter (Signed)
Please advise. Thanks Areebah Meinders 

## 2020-12-31 NOTE — Progress Notes (Signed)
OB-Pt present for follow up from ED visit. Pt c/o pressure, swelling in feet and hands, unable to walk and thick watery mucous discharge

## 2020-12-31 NOTE — Telephone Encounter (Signed)
Patient called this morning asking about her apt today- made her aware her apt today was not with [place] it was with PT. Pt said she was suppose to have a ER follow up with cherry. Pt no showed her apt on Tues. I believe pt was in hospital the day of her apt. How do you suggest we proceed with scheduling?

## 2020-12-31 NOTE — Telephone Encounter (Signed)
I told her she could come at 4:15 today when she was in the hospital.

## 2020-12-31 NOTE — Addendum Note (Signed)
Addended by: Fabian November on: 12/31/2020 09:15 PM   Modules accepted: Orders, SmartSet

## 2020-12-31 NOTE — OB Triage Note (Signed)
Pt Theresa Finley 30 y.o. presents to the ED complaining of contractions. Pt is a G6Y8472 at [redacted]w[redacted]d. Pt denies signs and symptoms consistent with rupture of membranes or active vaginal bleeding. Pt states that she has been having contractions mid-day today states positive fetal movement. External FM and TOCO applied to non-tender abdomen and assessing. Initial FHR 130. Vital signs obtained and within normal limits. Provider notified of pt.

## 2020-12-31 NOTE — Patient Instructions (Signed)
Hypertension During Pregnancy Hypertension is also called high blood pressure. High blood pressure means that the force of the blood moving in your body is high enough to cause problems for you and your baby. Different types of high blood pressure can happen during pregnancy. The types are: High blood pressure before you got pregnant. This is called chronic hypertension.  This can continue during your pregnancy. Your doctor will want to keep checking your blood pressure. You may need medicine to control your blood pressure while you are pregnant. You will need follow-up visits after you have your baby. High blood pressure that goes up during pregnancy when it was normal before. This is called gestational hypertension. It will often get better after you have your baby, but your doctor will need to watch your blood pressure to make sure that it is getting better. You may develop high blood pressure after giving birth. This is called postpartum hypertension. This often occurs within 48 hours after childbirth but may occur up to 6 weeks after giving birth. Very high blood pressure during pregnancy is an emergency that needs treatment right away. How does this affect me? If you have high blood pressure during pregnancy, you have a higher chance of developing high blood pressure: As you get older. If you get pregnant again. In some cases, high blood pressure during pregnancy can cause: Stroke. Heart attack. Damage to the kidneys, lungs, or liver. Preeclampsia. HELLP syndrome. Seizures. Problems with the placenta. How does this affect my baby? Your baby may: Be born early. Not weigh as much as he or she should. Not handle labor well, leading to a C-section. This condition may also result in a baby's death before birth (stillbirth). What are the risks? Having high blood pressure during a past pregnancy. Being overweight. Being age 35 or older. Being pregnant for the first time. Being pregnant  with more than one baby. Becoming pregnant using fertility methods, such as IVF. Having other problems, such as diabetes or kidney disease. What can I do to lower my risk?  Keep a healthy weight. Eat a healthy diet. Follow what your doctor tells you about treating any medical problems that you had before you got pregnant. It is very important to go to all of your doctor visits. Your doctor will check your blood pressure and make sure that your pregnancy is progressing as it should. Treatment should start early if a problem is found. How is this treated? Treatment for high blood pressure during pregnancy can vary. It depends on the type of high blood pressure you have and how serious it is. If you were taking medicine for your blood pressure before you got pregnant, talk with your doctor. You may need to change the medicine during pregnancy if it is not safe for your baby. If your blood pressure goes up during pregnancy, your doctor may order medicine to treat this. If you are at risk for preeclampsia, your doctor may tell you to take a low-dose aspirin while you are pregnant. If you have very high blood pressure, you may need to stay in the hospital so you and your baby can be watched closely. You may also need to take medicine to lower your blood pressure. In some cases, if your condition gets worse, you may need to have your baby early. Follow these instructions at home: Eating and drinking  Drink enough fluid to keep your pee (urine) pale yellow. Avoid caffeine. Lifestyle Do not smoke or use any products that contain   or tobacco. If you need help quitting, ask your doctor. Do not use alcohol or drugs. Avoid stress. Rest and get plenty of sleep. Regular exercise can help. Ask your doctor what kinds of exercise are best for you. General instructions Take over-the-counter and prescription medicines only as told by your doctor. Keep all prenatal and follow-up visits. Contact a  doctor if: You have symptoms that your doctor told you to watch for, such as: Headaches. A feeling like you may vomit (nausea). Vomiting. Belly (abdominal) pain. Feeling dizzy or light-headed. Get help right away if: You have symptoms of serious problems, such as: Very bad belly pain that does not get better with treatment. A very bad headache that does not get better. Blurry vision. Double vision. Vomiting that does not get better. Sudden, fast weight gain. Sudden swelling in your hands, ankles, or face. Bleeding from your vagina. Blood in your pee. Shortness of breath. Chest pain. Weakness on one side of your body. Trouble talking. Your baby is not moving as much as usual. These symptoms may be an emergency. Get help right away. Call your local emergency services (911 in the [place] Do not wait to see if the symptoms will go away. Do not drive yourself to the hospital. Summary High blood pressure is also called hypertension. High blood pressure means that the force of the blood moving in your body is high enough to cause problems for you and your baby. Get help right away if you have symptoms of serious problems due to high blood pressure. Keep all prenatal and follow-up visits. This information is not intended to replace advice given to you by your health care provider. Make sure you discuss any questions you have with your healthcare provider. Document Revised: 04/01/2020 Document Reviewed: 04/01/2020 Elsevier Patient Education  2022 [employer].

## 2020-12-31 NOTE — Progress Notes (Addendum)
2235 - RN provided pt. with information regarding the [employer] and encouraged pt to move around in the room, walk the halls, and use the birthing ball to bring contractions closer together and stronger.    2300 - RN informed Dr. Valentino Saxon that pt is irate regarding the possibility of being discharged this evening and that pt is demanding to speak with Dr. Valentino Saxon on the phone.   2303 - Gave pt. ASCOM phone with Dr. Valentino Saxon on the line to discuss plan of care. Pt specifically asked me to leave the room while she used the phone.   2308 - RN discussed plan of care with Dr. Valentino Saxon. Will continue with labor evaluation.

## 2021-01-01 ENCOUNTER — Encounter: Payer: Self-pay | Admitting: Obstetrics and Gynecology

## 2021-01-01 DIAGNOSIS — Z87891 Personal history of nicotine dependence: Secondary | ICD-10-CM | POA: Diagnosis not present

## 2021-01-01 DIAGNOSIS — O26893 Other specified pregnancy related conditions, third trimester: Secondary | ICD-10-CM | POA: Diagnosis present

## 2021-01-01 DIAGNOSIS — F1210 Cannabis abuse, uncomplicated: Secondary | ICD-10-CM | POA: Diagnosis present

## 2021-01-01 DIAGNOSIS — O09293 Supervision of pregnancy with other poor reproductive or obstetric history, third trimester: Secondary | ICD-10-CM

## 2021-01-01 DIAGNOSIS — O0930 Supervision of pregnancy with insufficient antenatal care, unspecified trimester: Secondary | ICD-10-CM

## 2021-01-01 DIAGNOSIS — O0933 Supervision of pregnancy with insufficient antenatal care, third trimester: Secondary | ICD-10-CM

## 2021-01-01 DIAGNOSIS — O99324 Drug use complicating childbirth: Secondary | ICD-10-CM | POA: Diagnosis present

## 2021-01-01 DIAGNOSIS — O99323 Drug use complicating pregnancy, third trimester: Secondary | ICD-10-CM | POA: Diagnosis not present

## 2021-01-01 DIAGNOSIS — Z8759 Personal history of other complications of pregnancy, childbirth and the puerperium: Secondary | ICD-10-CM

## 2021-01-01 DIAGNOSIS — Z3A39 39 weeks gestation of pregnancy: Secondary | ICD-10-CM

## 2021-01-01 LAB — CBC
HCT: 33.8 % — ABNORMAL LOW (ref 36.0–46.0)
Hemoglobin: 11.3 g/dL — ABNORMAL LOW (ref 12.0–15.0)
MCH: 30.5 pg (ref 26.0–34.0)
MCHC: 33.4 g/dL (ref 30.0–36.0)
MCV: 91.4 fL (ref 80.0–100.0)
Platelets: 112 10*3/uL — ABNORMAL LOW (ref 150–400)
RBC: 3.7 MIL/uL — ABNORMAL LOW (ref 3.87–5.11)
RDW: 13.4 % (ref 11.5–15.5)
WBC: 10.2 10*3/uL (ref 4.0–10.5)
nRBC: 0 % (ref 0.0–0.2)

## 2021-01-01 LAB — URINE DRUG SCREEN, QUALITATIVE (ARMC ONLY)
Amphetamines, Ur Screen: NOT DETECTED
Barbiturates, Ur Screen: NOT DETECTED
Benzodiazepine, Ur Scrn: NOT DETECTED
Cannabinoid 50 Ng, Ur ~~LOC~~: NOT DETECTED
Cocaine Metabolite,Ur ~~LOC~~: NOT DETECTED
MDMA (Ecstasy)Ur Screen: NOT DETECTED
Methadone Scn, Ur: NOT DETECTED
Opiate, Ur Screen: NOT DETECTED
Phencyclidine (PCP) Ur S: NOT DETECTED
Tricyclic, Ur Screen: POSITIVE — AB

## 2021-01-01 LAB — TYPE AND SCREEN
ABO/RH(D): A POS
Antibody Screen: NEGATIVE

## 2021-01-01 LAB — RPR: RPR Ser Ql: NONREACTIVE

## 2021-01-01 MED ORDER — LACTATED RINGERS IV SOLN
125.0000 mL/h | INTRAVENOUS | Status: DC
Start: 2021-01-01 — End: 2021-01-03
  Administered 2021-01-01: 125 mL/h via INTRAVENOUS

## 2021-01-01 MED ORDER — OXYCODONE-ACETAMINOPHEN 5-325 MG PO TABS
2.0000 | ORAL_TABLET | ORAL | Status: DC | PRN
  Administered 2021-01-01 – 2021-01-03 (×9): 2 via ORAL
  Filled 2021-01-01 (×8): qty 2

## 2021-01-01 MED ORDER — OXYTOCIN-SODIUM CHLORIDE 30-0.9 UT/500ML-% IV SOLN: 1.0000 m[IU]/min | INTRAVENOUS | Status: DC

## 2021-01-01 MED ORDER — ONDANSETRON HCL 4 MG/2ML IJ SOLN: 4.0000 mg | INTRAMUSCULAR | Status: DC | PRN

## 2021-01-01 MED ORDER — OXYTOCIN BOLUS FROM INFUSION
333.0000 mL | Freq: Once | INTRAVENOUS | Status: AC
  Administered 2021-01-01: 333 mL via INTRAVENOUS

## 2021-01-01 MED ORDER — IBUPROFEN 600 MG PO TABS
ORAL_TABLET | ORAL | Status: AC
  Filled 2021-01-01: qty 1

## 2021-01-01 MED ORDER — ZOLPIDEM TARTRATE 5 MG PO TABS
5.0000 mg | ORAL_TABLET | Freq: Every evening | ORAL | Status: DC | PRN
  Administered 2021-01-02: 5 mg via ORAL
  Filled 2021-01-01: qty 1

## 2021-01-01 MED ORDER — AMMONIA AROMATIC IN INHA
RESPIRATORY_TRACT | Status: AC
  Filled 2021-01-01: qty 10

## 2021-01-01 MED ORDER — SOD CITRATE-CITRIC ACID 500-334 MG/5ML PO SOLN: 30.0000 mL | ORAL | Status: DC | PRN

## 2021-01-01 MED ORDER — IBUPROFEN 600 MG PO TABS
600.0000 mg | ORAL_TABLET | Freq: Four times a day (QID) | ORAL | Status: DC
  Administered 2021-01-01 – 2021-01-03 (×10): 600 mg via ORAL
  Filled 2021-01-01 (×9): qty 1

## 2021-01-01 MED ORDER — LIDOCAINE HCL (PF) 1 % IJ SOLN
INTRAMUSCULAR | Status: AC
  Filled 2021-01-01: qty 30

## 2021-01-01 MED ORDER — BENZOCAINE-MENTHOL 20-0.5 % EX AERO
1.0000 "application " | INHALATION_SPRAY | CUTANEOUS | Status: DC | PRN
  Filled 2021-01-01: qty 56

## 2021-01-01 MED ORDER — DIPHENHYDRAMINE HCL 25 MG PO CAPS: 25.0000 mg | ORAL_CAPSULE | Freq: Four times a day (QID) | ORAL | Status: DC | PRN

## 2021-01-01 MED ORDER — SIMETHICONE 80 MG PO CHEW: 80.0000 mg | CHEWABLE_TABLET | ORAL | Status: DC | PRN

## 2021-01-01 MED ORDER — BENZOCAINE-MENTHOL 20-0.5 % EX AERO
INHALATION_SPRAY | CUTANEOUS | Status: AC
  Administered 2021-01-01: 1 via TOPICAL
  Filled 2021-01-01: qty 56

## 2021-01-01 MED ORDER — METHYLERGONOVINE MALEATE 0.2 MG/ML IJ SOLN
0.2000 mg | INTRAMUSCULAR | Status: DC | PRN
Start: 2021-01-01 — End: 2021-01-03

## 2021-01-01 MED ORDER — OXYCODONE-ACETAMINOPHEN 5-325 MG PO TABS
1.0000 | ORAL_TABLET | ORAL | Status: DC | PRN
  Administered 2021-01-01: 1 via ORAL
  Filled 2021-01-01 (×3): qty 1

## 2021-01-01 MED ORDER — OXYTOCIN 10 UNIT/ML IJ SOLN
INTRAMUSCULAR | Status: AC
  Filled 2021-01-01: qty 2

## 2021-01-01 MED ORDER — DIBUCAINE (PERIANAL) 1 % EX OINT
1.0000 "application " | TOPICAL_OINTMENT | CUTANEOUS | Status: DC | PRN
  Filled 2021-01-01: qty 28

## 2021-01-01 MED ORDER — SENNOSIDES-DOCUSATE SODIUM 8.6-50 MG PO TABS
2.0000 | ORAL_TABLET | Freq: Every day | ORAL | Status: DC
  Administered 2021-01-02: 2 via ORAL
  Filled 2021-01-01: qty 2

## 2021-01-01 MED ORDER — LACTATED RINGERS IV BOLUS
1000.0000 mL | Freq: Once | INTRAVENOUS | Status: AC
  Administered 2021-01-01: 1000 mL via INTRAVENOUS

## 2021-01-01 MED ORDER — LIDOCAINE HCL (PF) 1 % IJ SOLN: 30.0000 mL | INTRAMUSCULAR | Status: DC | PRN

## 2021-01-01 MED ORDER — OXYTOCIN-SODIUM CHLORIDE 30-0.9 UT/500ML-% IV SOLN
2.5000 [IU]/h | INTRAVENOUS | Status: DC
  Administered 2021-01-01: 2.5 [IU]/h via INTRAVENOUS
  Filled 2021-01-01: qty 500

## 2021-01-01 MED ORDER — ONDANSETRON HCL 4 MG PO TABS
4.0000 mg | ORAL_TABLET | ORAL | Status: DC | PRN
  Administered 2021-01-03 (×2): 4 mg via ORAL
  Filled 2021-01-01 (×3): qty 1

## 2021-01-01 MED ORDER — COCONUT OIL OIL
1.0000 "application " | TOPICAL_OIL | Status: DC | PRN
  Filled 2021-01-01 (×2): qty 120

## 2021-01-01 MED ORDER — ACETAMINOPHEN 325 MG PO TABS: 650.0000 mg | ORAL_TABLET | ORAL | Status: DC | PRN

## 2021-01-01 MED ORDER — TERBUTALINE SULFATE 1 MG/ML IJ SOLN: 0.2500 mg | Freq: Once | INTRAMUSCULAR | Status: DC | PRN

## 2021-01-01 MED ORDER — WITCH HAZEL-GLYCERIN EX PADS
1.0000 "application " | MEDICATED_PAD | CUTANEOUS | Status: DC | PRN
  Filled 2021-01-01: qty 100

## 2021-01-01 MED ORDER — ONDANSETRON HCL 4 MG/2ML IJ SOLN: 4.0000 mg | Freq: Four times a day (QID) | INTRAMUSCULAR | Status: DC | PRN

## 2021-01-01 MED ORDER — BUTORPHANOL TARTRATE 1 MG/ML IJ SOLN: 1.0000 mg | INTRAMUSCULAR | Status: DC | PRN

## 2021-01-01 MED ORDER — ACETAMINOPHEN 325 MG PO TABS
650.0000 mg | ORAL_TABLET | ORAL | Status: DC | PRN
  Administered 2021-01-01: 650 mg via ORAL
  Filled 2021-01-01: qty 2

## 2021-01-01 MED ORDER — MISOPROSTOL 200 MCG PO TABS
ORAL_TABLET | ORAL | Status: AC
  Filled 2021-01-01: qty 4

## 2021-01-01 MED ORDER — LACTATED RINGERS IV SOLN: 500.0000 mL | INTRAVENOUS | Status: DC | PRN

## 2021-01-01 MED ORDER — METHYLERGONOVINE MALEATE 0.2 MG PO TABS
0.2000 mg | ORAL_TABLET | ORAL | Status: DC | PRN
Start: 2021-01-01 — End: 2021-01-03
  Filled 2021-01-01: qty 1

## 2021-01-01 MED ORDER — PRENATAL MULTIVITAMIN CH
1.0000 | ORAL_TABLET | Freq: Every day | ORAL | Status: DC
  Administered 2021-01-01 – 2021-01-02 (×2): 1 via ORAL
  Filled 2021-01-01 (×2): qty 1

## 2021-01-01 MED ORDER — DIPHENHYDRAMINE HCL 50 MG/ML IJ SOLN
INTRAMUSCULAR | Status: AC
  Filled 2021-01-01: qty 1

## 2021-01-01 MED ORDER — DIPHENHYDRAMINE HCL 50 MG/ML IJ SOLN
25.0000 mg | Freq: Once | INTRAMUSCULAR | Status: AC
  Administered 2021-01-01: 25 mg via INTRAVENOUS

## 2021-01-01 NOTE — Lactation Note (Signed)
This note was copied from a baby's chart. Lactation Consultation Note  Patient Name: Theresa Finley [id number] Date: 01/01/2021 Reason for consult: Follow-up assessment Age:30 hours  Maternal Data Has patient been taught Hand Expression?: Yes Does the patient have breastfeeding experience prior to this delivery?: Yes How long did the patient breastfeed?: Mostly pumped  Feeding Mother's Current Feeding Choice: Breast Milk  Observed mom breast feeding Hope well without assistance in cradle hold on right breast.  Mom denies any breast or nipple pain.  Hope has a strong, rhythmic suck.  Mom reports trying to breast feed her last, but had problems with latching so pumped for a while.  Hand out given on what to expect with breast feeding the first 4 days of life reviewing feeding cues, normal newborn stomach size, adequate intake and out put, supply and demand, normal course of lactation and routine newborn feeding patterns.  Lactation Resource sheet given and reviewed support groups, information web sites and contact numbers.  Encouraged mom to call with any questions, concerns or assistance. LATCH Score Latch: Grasps breast easily, tongue down, lips flanged, rhythmical sucking.  Audible Swallowing: A few with stimulation  Type of Nipple: Everted at rest and after stimulation  Comfort (Breast/Nipple): Soft / non-tender  Hold (Positioning): No assistance needed to correctly position infant at breast.  LATCH Score: 9   Lactation Tools Discussed/Used    Interventions Interventions: Breast feeding basics reviewed;Breast massage;Hand express;Breast compression;Support pillows;Position options;Education  Discharge Discharge Education: Engorgement and breast care;Warning signs for feeding baby;Other (comment) WIC Program: Yes  Consult Status Consult Status: PRN    Louis Meckel 01/01/2021, 11:01 PM

## 2021-01-01 NOTE — H&P (Signed)
Obstetric History and Physical  Theresa Finley is a 30 y.o. [id number] with IUP at [redacted]w[redacted]d presenting for complaints of increasing contractions beginning today.  Of note, patient had membrane sweeping performed in the office earlier today. Patient states she has been having  irregular, every 5-7 minutes contractions, minimal vaginal bleeding, intact membranes, with active fetal movement.    Prenatal Course Source of Care: [facility] with onset of care at 32 weeks (4 visits total) Pregnancy complications or risks: Patient Active Problem List   Diagnosis Date Noted   Pelvic pressure in pregnancy, antepartum, third trimester 12/28/2020   Gastroesophageal reflux in pregnancy in third trimester 12/14/2020   Normal labor 10/12/2019   Smoking 08/03/2019   Labor and delivery, indication for care 03/08/2018   Supervision of low-risk pregnancy 10/22/2017   She plans to formula feed She desires  unsure method  for postpartum contraception.   Prenatal labs and studies: ABO, Rh: A/Positive/-- (04/21 1445) Antibody: Negative (04/21 1445) Rubella: 1.82 (04/21 1445) RPR: Non Reactive (04/21 1445)  HBsAg: Negative (04/21 1445)  HIV: Non Reactive (04/21 1445)  WVP:XTGGYIRS/-- (05/25 0912) 1 hr Glucola:  patient did not perform Genetic screening normal Anatomy US normal   Past Medical History:  Diagnosis Date   Medical history non-contributory    Trichomonas infection 01/30/2018   Neg 12/15/17    Past Surgical History:  Procedure Laterality Date   CAPSULAR RELEASE Right 10/15/2018   Procedure: Right elbow hardware removal, arthrotomy and synovectomy right elbow with capsular release;  Surgeon: Dominica Severin, MD;  Location: [facility];  Service: Orthopedics;  Laterality: Right;  2 hrs for both procedures   HARDWARE REMOVAL Right 10/15/2018   Procedure: HARDWARE REMOVAL;  Surgeon: Dominica Severin, MD;  Location: [facility];  Service: Orthopedics;   Laterality: Right;   ORIF RADIAL FRACTURE  07/27/2012   Procedure: OPEN REDUCTION INTERNAL FIXATION (ORIF) RADIAL FRACTURE;  Surgeon: Dominica Severin, MD;  Location: [facility];  Service: Orthopedics;  Laterality: Right;  ORIF Right Radial Head Fracture    OB History  Gravida Para Term Preterm AB Living  6 4 4  0 1 4  SAB IAB Ectopic Multiple Live Births  1 0 0 0 4    # Outcome Date GA Lbr Len/2nd Weight Sex Delivery Anes PTL Lv  6 Current           5 Term 10/13/19 [redacted]w[redacted]d / 00:04 2400 g F Vag-Spont   LIV  4 Term 03/08/18 [redacted]w[redacted]d 09:10 / 00:13 2977 g F Vag-Spont EPI  LIV  3 SAB 03/24/17 [redacted]w[redacted]d            Birth Comments: per patient had 3 home upt's positive , but did not go to doctor and had a lot of bleeding  2 Term 05/25/14 [redacted]w[redacted]d 04:58 / 00:14 2466 g F Vag-Spont EPI  LIV  1 Term 2012 [redacted]w[redacted]d  3118 g  Vag-Spont EPI  LIV     Birth Comments: no complications    Social History   Socioeconomic History   Marital status: Single    Spouse name: Not on file   Number of children: Not on file   Years of education: Not on file   Highest education level: Not on file  Occupational History   Not on file  Tobacco Use   Smoking status: Former    Pack years: 0.00    Types: Cigarettes    Quit date: 02/18/2013    Years since quitting: 7.8  Smokeless tobacco: Never   Tobacco comments:    Patient reports that she rarly smokes  Vaping Use   Vaping Use: Never used  Substance and Sexual Activity   Alcohol use: No   Drug use: Not Currently    Types: Marijuana    Comment: last use of weed 02/16/2018   Sexual activity: Yes    Birth control/protection: Injection  Other Topics Concern   Not on file  Social History Narrative   Not on file   Social Determinants of Health   Financial Resource Strain: Not on file  Food Insecurity: Not on file  Transportation Needs: Not on file  Physical Activity: Not on file  Stress: Not on file  Social Connections: Not on file    Family History  Problem Relation  Age of Onset   Cancer Mother     Medications Prior to Admission  Medication Sig Dispense Refill Last Dose   acetaminophen (TYLENOL) 325 MG tablet Take 650 mg by mouth every 6 (six) hours as needed.      oxyCODONE-acetaminophen (PERCOCET/ROXICET) 5-325 MG tablet Take 1 tablet by mouth every 4 (four) hours as needed for severe pain. 10 tablet 0    pantoprazole (PROTONIX) 20 MG tablet Take 1 tablet (20 mg total) by mouth daily. 60 tablet 2    Prenatal Vit-Fe Fumarate-FA (PRENATAL MULTIVITAMIN) TABS tablet Take 1 tablet by mouth daily at 12 noon.      zolpidem (AMBIEN) 5 MG tablet Take 1 tablet (5 mg total) by mouth at bedtime as needed for up to 2 days for sleep. 2 tablet 0     No Known Allergies  Review of Systems: Negative except for what is mentioned in HPI.  Physical Exam: BP 132/88 (BP Location: Left Arm)   Pulse 76   Temp 98.3 F (36.8 C) (Oral)   Resp 16   Ht 5\' 4"  (1.626 m)   Wt 73.9 kg   LMP  (LMP Unknown)   BMI 27.98 kg/m  CONSTITUTIONAL: Well-developed, well-nourished female in no acute distress.  HENT:  Normocephalic, atraumatic, External right and left ear normal. Oropharynx is clear and moist EYES: Conjunctivae and EOM are normal. Pupils are equal, round, and reactive to light. No scleral icterus.  NECK: Normal range of motion, supple, no masses SKIN: Skin is warm and dry. No rash noted. Not diaphoretic. No erythema. No pallor. NEUROLOGIC: Alert and oriented to person, place, and time. Normal reflexes, muscle tone coordination. No cranial nerve deficit noted. PSYCHIATRIC: Normal mood and affect. Normal behavior. Normal judgment and thought content. CARDIOVASCULAR: Normal heart rate noted, regular rhythm RESPIRATORY: Effort and breath sounds normal, no problems with respiration noted ABDOMEN: Soft, nontender, nondistended, gravid. MUSCULOSKELETAL: Normal range of motion. No edema and no tenderness. 2+ distal pulses.  Cervical Exam: Dilatation 4.5 cm   Effacement 60%    Station -3 to -2   Presentation: cephalic FHT:  Baseline rate 135 bpm   Variability minimal (intermittent periods of moderate) Accelerations scarce   Decelerations none Contractions: Every 4-6 mins   Pertinent Labs/Studies:   Results for orders placed or performed during the hospital encounter of 12/31/20 (from the past 24 hour(s))  CBC     Status: Abnormal   Collection Time: 01/01/21  2:16 AM  Result Value Ref Range   WBC 10.2 4.0 - 10.5 K/uL   RBC 3.70 (L) 3.87 - 5.11 MIL/uL   Hemoglobin 11.3 (L) 12.0 - 15.0 g/dL   HCT 03/03/21 (L) 82.5 - 05.3 %  MCV 91.4 80.0 - 100.0 fL   MCH 30.5 26.0 - 34.0 pg   MCHC 33.4 30.0 - 36.0 g/dL   RDW 10.9 32.3 - 55.7 %   Platelets 112 (L) 150 - 400 K/uL   nRBC 0.0 0.0 - 0.2 %  Type and screen     Status: None   Collection Time: 01/01/21  2:16 AM  Result Value Ref Range   ABO/RH(D) A POS    Antibody Screen NEG    Sample Expiration      01/04/2021,2359 Performed at [facility], [address], [place], [place] [address]     Assessment : CATHLENE GARDELLA is a 30 y.o. [id number] at [redacted]w[redacted]d being admitted for latent labor, Category II tracing. Limited PNC, marijuana use in pregnancy, h/o precipitous delivery.  Plan: Labor: Expectant management. Augmentation as needed  as per protocol. Analgesia as needed. FWB: Category II fetal heart tracing.  Continue to monitor. GBS negative Delivery plan: Hopeful for vaginal delivery   Hildred Laser, MD [facility]

## 2021-01-01 NOTE — Progress Notes (Signed)
Intrapartum Progress Note  S: Patient notes contractions getting a little stronger  O: Blood pressure 132/88, pulse 76, temperature 98.3 F (36.8 C), temperature source Oral, resp. rate 16, height 5\' 4"  (1.626 m), weight 73.9 kg, unknown if currently breastfeeding. Gen App: NAD, comfortable Abdomen: soft, gravid FHT: baseline 120 bpm.  Accels absent.  Decels absent. Minimal to moderate variability.   Tocometer: contractions q 2-4 minutes Cervix: 4.5/60/-3 to -2 Extremities: Nontender, no edema.  Pitocin: None  Labs: No new labs   Assessment:  1: SIUP at [redacted]w[redacted]d 2. Limited PNC 3. H/o precipitous delivery 4. Latent labor 5. Category II tracing  Plan:  1. AROM'd with scant clear fluid.  2. Category II tracing.  No major change after fluid bolus. Continue to monitor closely. No decelerations present.  3. Anticipate vaginal delivery.   [redacted]w[redacted]d, MD 01/01/2021 3:22 AM

## 2021-01-01 NOTE — Discharge Instructions (Signed)

## 2021-01-02 MED ORDER — IBUPROFEN 600 MG PO TABS: 600.0000 mg | ORAL_TABLET | Freq: Four times a day (QID) | ORAL | 1 refills | Status: DC

## 2021-01-02 NOTE — Discharge Summary (Signed)
   Postpartum Discharge Summary      Patient Name: Theresa Finley DOB: 05/20/1991 MRN: [id number]  Date of admission: 12/31/2020 Delivery date:01/01/2021  Delivering provider: CHERRY, ANIKA  Date of discharge: 01/03/2021  Admitting diagnosis: Normal labor [O80, Z37.9] Intrauterine pregnancy: [redacted]w[redacted]d     Secondary diagnosis:  Active Problems:   Limited prenatal care, antepartum   Marijuana abuse   History of precipitous delivery   Category II fetal heart rate tracing during labor and delivery  Additional problems: None   Discharge diagnosis: Term Pregnancy Delivered                                              Post partum procedures: None Augmentation: AROM Complications: None  Hospital course: Onset of Labor With Vaginal Delivery      30 y.o. yo G6P5015 at [redacted]w[redacted]d was admitted in Latent Labor on 12/31/2020. Patient had an uncomplicated labor course as follows:  Membrane Rupture Time/Date: 3:18 AM ,01/01/2021   Delivery Method:Vaginal, Spontaneous  Episiotomy: None  Lacerations:  None  Patient had an uncomplicated postpartum course.  She is ambulating, tolerating a regular diet, passing flatus, and urinating well. Patient is discharged home in stable condition on 01/03/21.  Newborn Data: Birth date:01/01/2021  Birth time:4:51 AM  Gender:Female  Living status:Living  Apgars:9 ,9  Weight:2890 g   Magnesium Sulfate received: No BMZ received: No Rhophylac:No MMR:No T-DaP:Given prenatally Flu: No Transfusion:No  Physical exam  Vitals:   01/02/21 0823 01/02/21 1738 01/02/21 2301 01/03/21 0757  BP: 118/80 122/82 134/86 135/80  Pulse: 90 81 91 80  Resp: 18 18 20 17  Temp: 97.7 F (36.5 C) 98.3 F (36.8 C) 98.2 F (36.8 C) 98.4 F (36.9 C)  TempSrc: Oral Oral Oral Oral  SpO2: 100% 100% 99% 99%  Weight:      Height:       General: alert, cooperative, and no distress Lochia: appropriate Uterine Fundus: firm Incision: N/A DVT Evaluation: No evidence of DVT seen on  physical exam. Negative Homan's sign.  No cords or calf tenderness. No significant calf/ankle edema.   Labs: Lab Results  Component Value Date   WBC 10.2 01/01/2021   HGB 11.3 (L) 01/01/2021   HCT 33.8 (L) 01/01/2021   MCV 91.4 01/01/2021   PLT 112 (L) 01/01/2021   CMP Latest Ref Rng & Units 08/18/2020  Glucose 70 - 99 mg/dL 76  BUN 6 - 20 mg/dL 7  Creatinine 0.44 - 1.00 mg/dL 0.54  Sodium 135 - 145 mmol/L 135  Potassium 3.5 - 5.1 mmol/L 3.7  Chloride 98 - 111 mmol/L 103  CO2 22 - 32 mmol/L 22  Calcium 8.9 - 10.3 mg/dL 8.7(L)  Total Protein 6.5 - 8.1 g/dL 7.0  Total Bilirubin 0.3 - 1.2 mg/dL 0.8  Alkaline Phos 38 - 126 U/L 53  AST 15 - 41 U/L 38  ALT 0 - 44 U/L 57(H)   Edinburgh Score: Edinburgh Postnatal Depression Scale Screening Tool 01/01/2021  I have been [place] to laugh and see the funny side of things. 0  I have looked forward with enjoyment to things. 0  I have blamed myself unnecessarily when things went wrong. 0  I have been anxious or worried for no good reason. 0  I have felt scared or panicky for no good reason. 0  Things have been getting on top of   me. 0  I have been so unhappy that I have had difficulty sleeping. 0  I have felt sad or miserable. 0  I have been so unhappy that I have been crying. 0  The thought of harming myself has occurred to me. 0  Edinburgh Postnatal Depression Scale Total 0      After visit meds:  Allergies as of 01/02/2021   No Known Allergies      Medication List     STOP taking these medications    oxyCODONE-acetaminophen 5-325 MG tablet Commonly known as: PERCOCET/ROXICET   pantoprazole 20 MG tablet Commonly known as: Protonix   prenatal multivitamin Tabs tablet   zolpidem 5 MG tablet Commonly known as: AMBIEN       TAKE these medications    acetaminophen 325 MG tablet Commonly known as: TYLENOL Take 650 mg by mouth every 6 (six) hours as needed.   ibuprofen 600 MG tablet Commonly known as: ADVIL Take 1  tablet (600 mg total) by mouth every 6 (six) hours. Start taking on: January 03, 2021         Discharge home in stable condition Infant Feeding: Bottle Infant Disposition:rooming in Discharge instruction: per After Visit Summary and Postpartum booklet. Activity: Advance as tolerated. Pelvic rest for 6 weeks.  Diet: routine diet Anticipated Birth Control: PP Depo given Postpartum Appointment:4 weeks Additional Postpartum F/U:  None Future Appointments: Future Appointments  Date Time Provider Department Center  01/03/2021  8:00 AM Yeung, Shin-Yiing, PT [facility] None  01/20/2021  5:00 PM Yeung, Shin-Yiing, PT [facility] None   Follow up Visit:  Follow-up Information     Cherry, Anika, MD. Schedule an appointment as soon as possible for a visit.   Specialties: Obstetrics and Gynecology, Radiology Why: 4 weeks postpartum visit Contact information: [address] [place] [address] [phone]                     01/02/2021 Anika Cherry, MD   

## 2021-01-02 NOTE — Progress Notes (Signed)
Post Partum Day # 1, s/p SVD  Subjective: no complaints, up ad lib, voiding, and tolerating PO  Objective: Temp:  [97.7 F (36.5 C)-98.5 F (36.9 C)] 97.7 F (36.5 C) (06/12 0823) Pulse Rate:  [70-90] 90 (06/12 0823) Resp:  [16-20] 18 (06/12 0823) BP: (118-142)/(65-89) 118/80 (06/12 0823) SpO2:  [99 %-100 %] 100 % (06/12 0823)  Physical Exam:  General: alert, cooperative, and no distress  Lungs: clear to auscultation bilaterally Breasts: normal appearance, no masses or tenderness Heart: regular rate and rhythm, S1, S2 normal, no murmur, click, rub or gallop Abdomen: soft, non-tender; bowel sounds normal; no masses,  no organomegaly Pelvis: Lochia: appropriate, Uterine Fundus: firm Extremities: DVT Evaluation: No evidence of DVT seen on physical exam. Negative Homan's  sign. No cords or calf tenderness.  No significant calf/ankle edema.       Recent Labs    01/01/21 0216  HGB 11.3*  HCT 33.8*    Assessment/Plan: Doing well postpartum Formula feeding Unsure of contraceptive desires Dispo: likely home tomorrow as infant receiving phototherapy.    LOS: 1 day   Hildred Laser, MD [facility]

## 2021-01-03 ENCOUNTER — Ambulatory Visit: Payer: Medicaid Other | Attending: Obstetrics and Gynecology | Admitting: Physical Therapy

## 2021-01-03 MED ORDER — MEDROXYPROGESTERONE ACETATE 150 MG/ML IM SUSP
150.0000 mg | Freq: Once | INTRAMUSCULAR | Status: AC
  Administered 2021-01-03: 150 mg via INTRAMUSCULAR
  Filled 2021-01-03: qty 1

## 2021-01-03 NOTE — Progress Notes (Addendum)
Patient discharged, infant remains under phototherapy. Discharge instructions, when to follow up, and prescriptions reviewed with patient.  Patient verbalized understanding. Patient will remain rooming in with infant.  Depo received prior to discharge.

## 2021-01-03 NOTE — Progress Notes (Signed)
Patient complaining of nausea. Zofran given.

## 2021-01-03 NOTE — Progress Notes (Addendum)
Post Partum Day # 2, s/p SVD  Subjective: no complaints, up ad lib, voiding, and tolerating PO  Objective: Vitals:   01/01/21 2304 01/02/21 0823 01/02/21 1738 01/02/21 2301  BP: 124/89 118/80 122/82 134/86  Pulse: 80 90 81 91  Resp: 18 18 18 20   Temp: 98.2 F (36.8 C) 97.7 F (36.5 C) 98.3 F (36.8 C) 98.2 F (36.8 C)  TempSrc: Oral Oral Oral Oral  SpO2: 100% 100% 100% 99%  Weight:      Height:         Physical Exam:  General: alert, cooperative, and no distress  Lungs: clear to auscultation bilaterally Breasts: normal appearance, no masses or tenderness Heart: regular rate and rhythm, S1, S2 normal, no murmur, click, rub or gallop Abdomen: soft, non-tender; bowel sounds normal; no masses,  no organomegaly Pelvis: Lochia: appropriate, Uterine Fundus: firm Extremities: DVT Evaluation: No evidence of DVT seen on physical exam. Negative Homan's  sign. No cords or calf tenderness.  No significant calf/ankle edema.      Recent Labs    01/01/21 0216  HGB 11.3*  HCT 33.8*    Assessment/Plan: Doing well postpartum Formula feeding Desires Depo Provera for contraception.  Dispo: home today. Will do rooming in with infant for continued management of hyperbilirubinemia.   LOS: 2 days   03/03/21, MD [facility]

## 2021-01-05 ENCOUNTER — Inpatient Hospital Stay: Admit: 2021-01-05 | Payer: Self-pay

## 2021-01-11 ENCOUNTER — Other Ambulatory Visit: Payer: Medicaid Other

## 2021-01-20 ENCOUNTER — Encounter: Payer: Medicaid Other | Admitting: Physical Therapy

## 2021-02-17 ENCOUNTER — Encounter: Payer: Medicaid Other | Admitting: Physical Therapy

## 2022-02-09 IMAGING — US US OB COMP +14 WK
1 series · 14 of 28 positions shown · non-contrast
Comparison: none

CLINICAL DATA: 29-year-old pregnant female presents for fetal
anatomic survey. No prenatal care.

EXAM:
OBSTETRICAL ULTRASOUND >14 WKS

[Series 1: us ob comp + 14 wk · 14 of 67 slices shown]
[im 3/67]
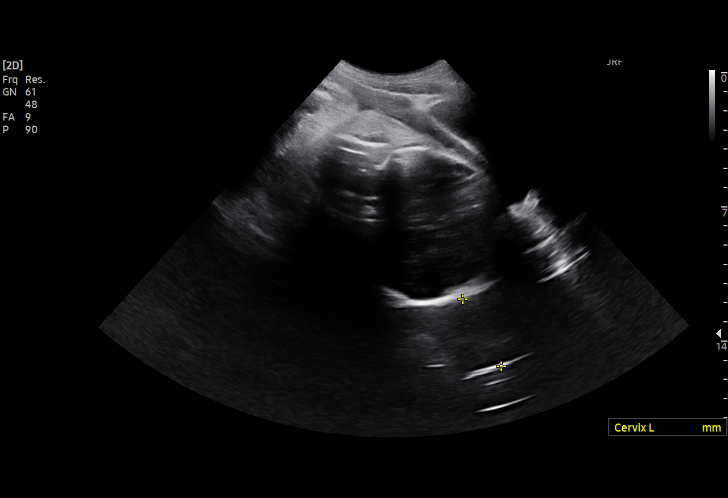
[im 8/67]
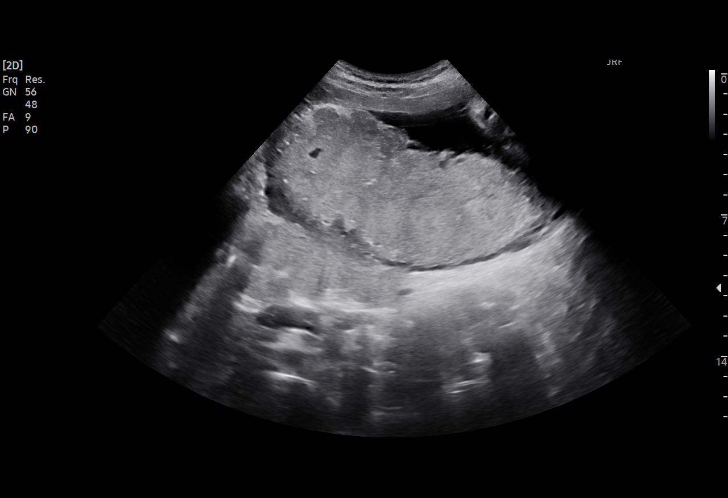
[im 13/67]
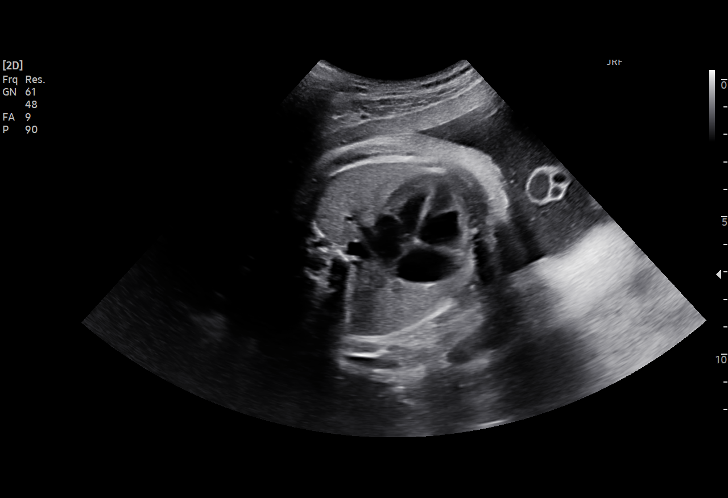
[im 18/67]
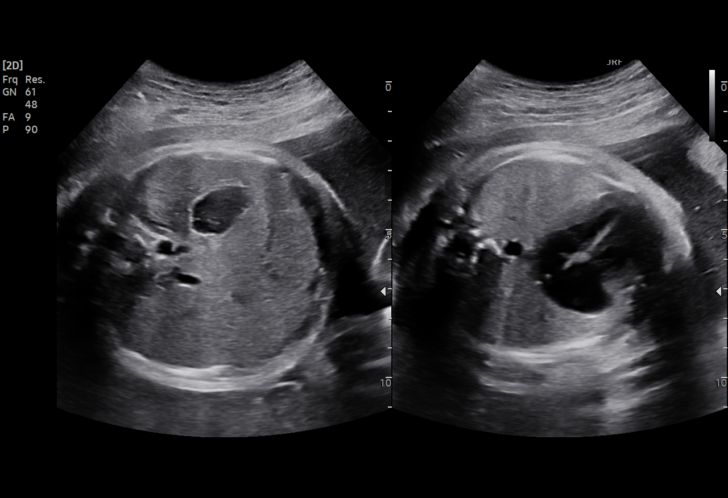
[im 23/67]
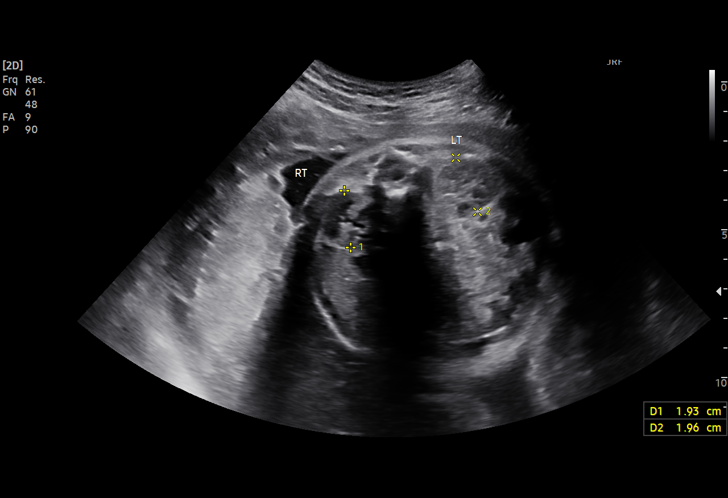
[im 27/67]
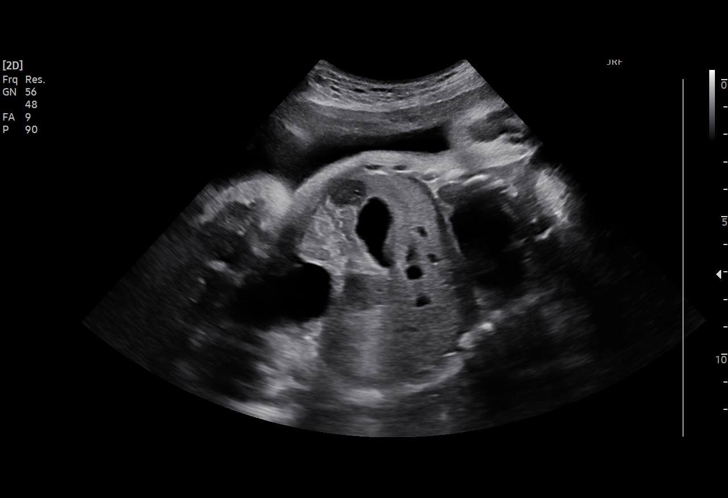
[im 32/67]
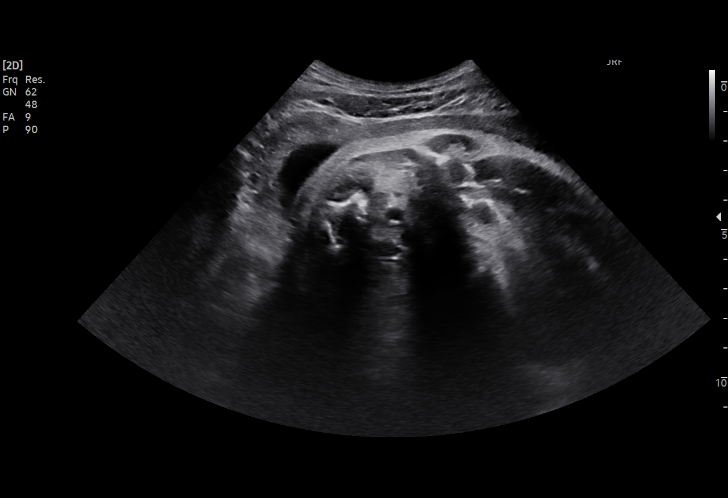
[im 37/67]
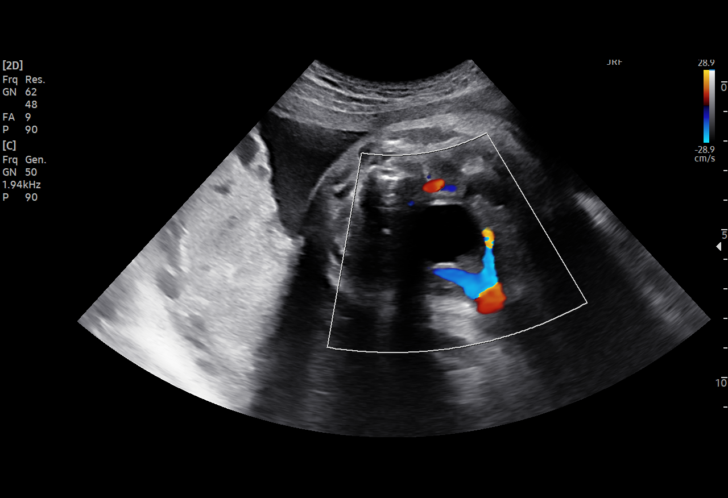
[im 42/67]
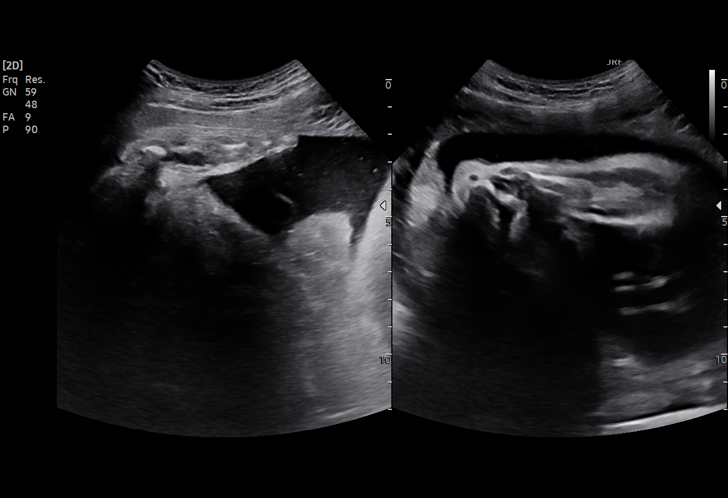
[im 47/67]
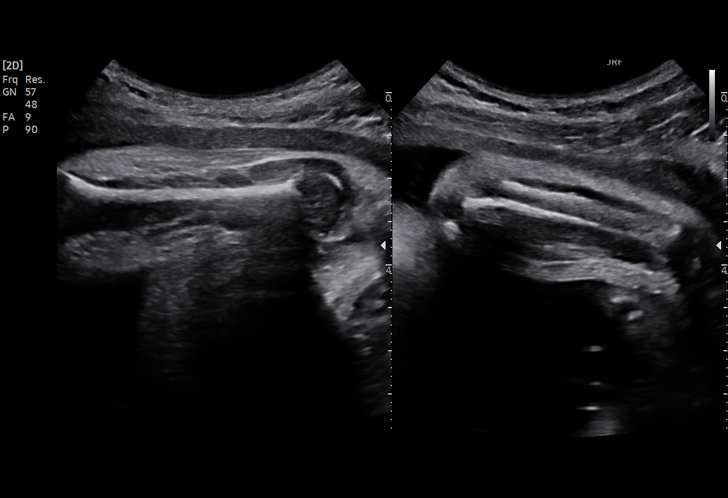
[im 52/67]
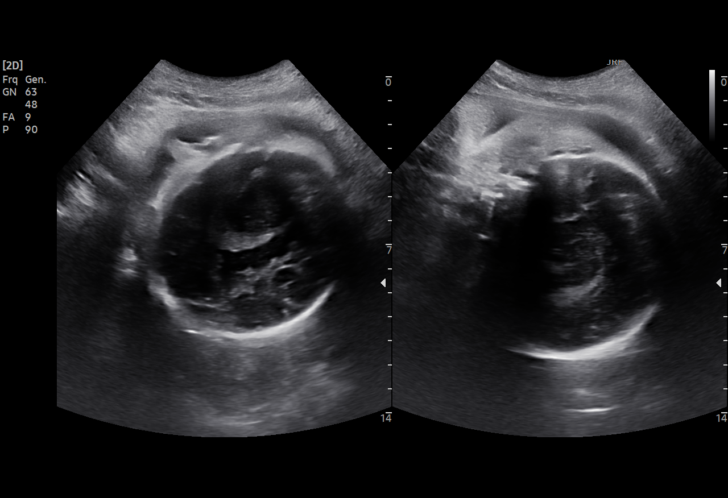
[im 57/67]
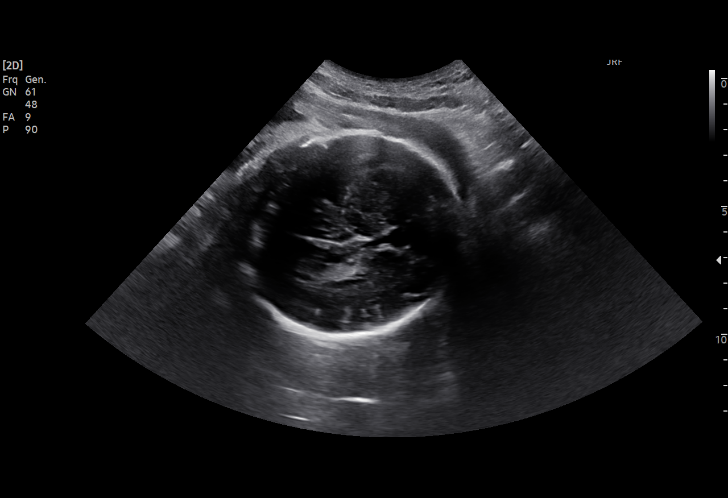
[im 62/67]
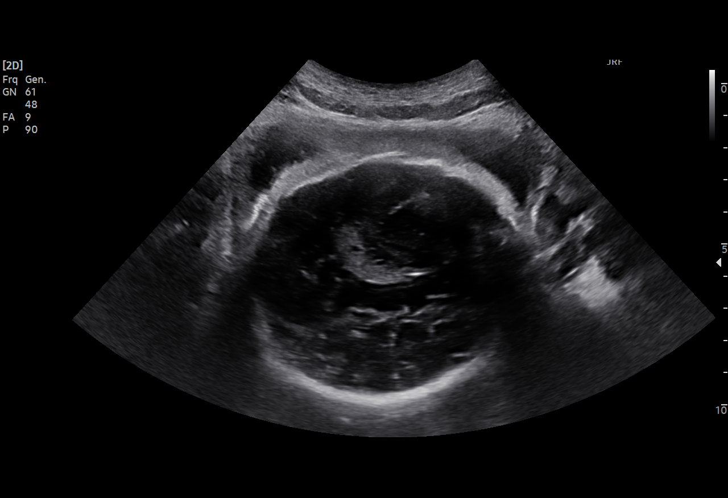
[im 67/67]
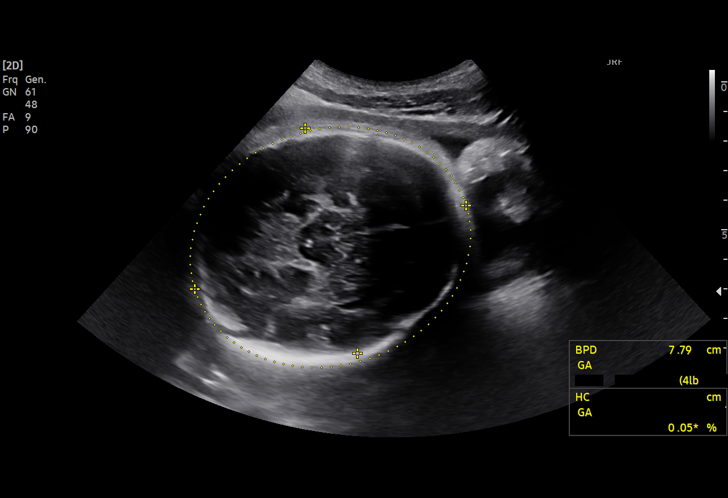

[14 of 28 positions shown; findings below may reference images not displayed]

FINDINGS: Number of Fetuses: 1

Heart Rate:  143 bpm

Movement: Yes

Presentation: Cephalic

Previa: No

Placental Location: Posterior

Amniotic Fluid (Subjective): Normal

Amniotic Fluid (Objective):

Vertical pocket = 4.6cm

AFI = 13.4 cm (5%ile= 8.3 cm, 95%= 24.5 cm for 33 wks)

FETAL BIOMETRY

BPD: 7.8cm 31w 2d

HC:   27.9cm 30w 4d

AC:   29.1cm 33w 1d

FL:   5.9cm 30w 5d

Current Mean GA: 33w 1d US EDC: 01/05/2021

Assigned GA:  31w 4d Assigned EDC: 01/16/2021

Estimated Fetal Weight:  1,881g 35%ile

FETAL ANATOMY

Lateral Ventricles: Appears normal. Tiny 0.3 cm left choroid plexus
cyst.

Thalami/CSP: Limited views appear normal

Posterior Fossa:  Not visualized

Nuchal Region: Not visualized   NFT= N/A > 20 WKS

Upper Lip: Appears normal

Spine: Limited views appear normal

4 Chamber Heart on Left: Appears normal

LVOT: Appears normal on limited view

RVOT: Appears normal

Stomach on Left: Appears normal

3 Vessel Cord: Appears normal

Cord Insertion site: Not visualized

Kidneys: Appears normal

Bladder: Appears normal

Extremities: Appears normal, 4 extremities demonstrated

Technically difficult due to: Advanced gestational age and fetal
position

Maternal Findings:

Cervix: Cervix length approximately 4.0 cm on limited transabdominal
views with no evidence of internal cervical funneling.
IMPRESSION: 1. Single living intrauterine gestation in cephalic lie at 31 weeks
4 days by average ultrasound age, compared to expected gestational
age of 33 weeks 1 day by provided dating.
2. Estimated fetal weight 5115 g, at the 35th percentile by expected
gestational age.
3. Normal amniotic fluid volume.  AFI 13.4.
4. Tiny left choroid plexus cyst. Otherwise no fetal abnormalities
detected, with limitations as detailed. Correlate with maternal risk
factors and serum testing as clinically warranted.
5. No maternal abnormalities detected.

These results will be called to the ordering clinician or
representative by the [HOSPITAL] at the imaging location.

## 2023-02-14 ENCOUNTER — Encounter (HOSPITAL_COMMUNITY): Payer: Self-pay | Admitting: *Deleted

## 2023-02-14 ENCOUNTER — Emergency Department (HOSPITAL_COMMUNITY): Payer: Medicaid Other

## 2023-02-14 ENCOUNTER — Emergency Department (HOSPITAL_COMMUNITY)
Admission: EM | Admit: 2023-02-14 | Discharge: 2023-02-14 | Disposition: A | Discharge status: Home / Self Care | Payer: Medicaid Other | Attending: Emergency Medicine | Admitting: Emergency Medicine

## 2023-02-14 ENCOUNTER — Other Ambulatory Visit: Payer: Self-pay

## 2023-02-14 DIAGNOSIS — R0789 Other chest pain: Secondary | ICD-10-CM | POA: Diagnosis not present

## 2023-02-14 DIAGNOSIS — S93602A Unspecified sprain of left foot, initial encounter: Secondary | ICD-10-CM | POA: Insufficient documentation

## 2023-02-14 DIAGNOSIS — S8391XA Sprain of unspecified site of right knee, initial encounter: Secondary | ICD-10-CM | POA: Diagnosis not present

## 2023-02-14 DIAGNOSIS — S8991XA Unspecified injury of right lower leg, initial encounter: Secondary | ICD-10-CM | POA: Diagnosis present

## 2023-02-14 DIAGNOSIS — S3991XA Unspecified injury of abdomen, initial encounter: Secondary | ICD-10-CM | POA: Diagnosis not present

## 2023-02-14 LAB — COMPREHENSIVE METABOLIC PANEL
ALT: 17 U/L (ref 0–44)
AST: 27 U/L (ref 15–41)
Albumin: 3.6 g/dL (ref 3.5–5.0)
Alkaline Phosphatase: 57 U/L (ref 38–126)
Anion gap: 10 (ref 5–15)
BUN: 6 mg/dL (ref 6–20)
CO2: 21 mmol/L — ABNORMAL LOW (ref 22–32)
Calcium: 8.9 mg/dL (ref 8.9–10.3)
Chloride: 103 mmol/L (ref 98–111)
Creatinine, Ser: 0.8 mg/dL (ref 0.44–1.00)
GFR, Estimated: >60 mL/min (ref >60)
Glucose, Bld: 82 mg/dL (ref 70–99)
Potassium: 3.4 mmol/L — ABNORMAL LOW (ref 3.5–5.1)
Sodium: 134 mmol/L — ABNORMAL LOW (ref 135–145)
Total Bilirubin: 1.3 mg/dL — ABNORMAL HIGH (ref 0.3–1.2)
Total Protein: 7.2 g/dL (ref 6.5–8.1)

## 2023-02-14 LAB — CBC
HCT: 35.7 % — ABNORMAL LOW (ref 36.0–46.0)
Hemoglobin: 12.1 g/dL (ref 12.0–15.0)
MCH: 32.2 pg (ref 26.0–34.0)
MCHC: 33.9 g/dL (ref 30.0–36.0)
MCV: 94.9 fL (ref 80.0–100.0)
Platelets: 252 10*3/uL (ref 150–400)
RBC: 3.76 MIL/uL — ABNORMAL LOW (ref 3.87–5.11)
RDW: 13.2 % (ref 11.5–15.5)
WBC: 10.6 10*3/uL — ABNORMAL HIGH (ref 4.0–10.5)
nRBC: 0 % (ref 0.0–0.2)

## 2023-02-14 LAB — LIPASE, BLOOD: Lipase: 27 U/L (ref 11–51)

## 2023-02-14 LAB — HCG, SERUM, QUALITATIVE: Preg, Serum: NEGATIVE

## 2023-02-14 MED ORDER — METHOCARBAMOL 500 MG PO TABS: 500.0000 mg | ORAL_TABLET | Freq: Three times a day (TID) | ORAL | 0 refills | Status: DC | PRN

## 2023-02-14 MED ORDER — METHOCARBAMOL 500 MG PO TABS: 500.0000 mg | ORAL_TABLET | Freq: Three times a day (TID) | ORAL | 0 refills | Status: AC | PRN

## 2023-02-14 MED ORDER — IOHEXOL 350 MG/ML SOLN
75.0000 mL | Freq: Once | INTRAVENOUS | Status: AC | PRN
  Administered 2023-02-14: 75 mL via INTRAVENOUS

## 2023-02-14 MED ORDER — OXYCODONE-ACETAMINOPHEN 5-325 MG PO TABS
1.0000 | ORAL_TABLET | ORAL | Status: AC | PRN
  Administered 2023-02-14 (×2): 1 via ORAL
  Filled 2023-02-14 (×2): qty 1

## 2023-02-14 MED ORDER — FENTANYL CITRATE PF 50 MCG/ML IJ SOSY
50.0000 ug | PREFILLED_SYRINGE | Freq: Once | INTRAMUSCULAR | Status: AC
  Administered 2023-02-14: 50 ug via INTRAVENOUS
  Filled 2023-02-14: qty 1

## 2023-02-14 MED ORDER — KETOROLAC TROMETHAMINE 15 MG/ML IJ SOLN
15.0000 mg | Freq: Once | INTRAMUSCULAR | Status: AC
  Administered 2023-02-14: 15 mg via INTRAVENOUS
  Filled 2023-02-14: qty 1

## 2023-02-14 MED ORDER — ONDANSETRON HCL 4 MG/2ML IJ SOLN
4.0000 mg | Freq: Once | INTRAMUSCULAR | Status: AC
  Administered 2023-02-14: 4 mg via INTRAVENOUS
  Filled 2023-02-14: qty 2

## 2023-02-14 MED ORDER — OXYCODONE-ACETAMINOPHEN 5-325 MG PO TABS
1.0000 | ORAL_TABLET | Freq: Once | ORAL | Status: DC
  Filled 2023-02-14: qty 1
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
## 2023-02-14 NOTE — ED Notes (Addendum)
Pt transported to xray 

## 2023-02-14 NOTE — ED Notes (Signed)
Pt screaming and cursing saying she needs her pain medicine. When this nurse attempted to administer pt refused stating she needs IV medication the PO medication is not going to work. MD Pickering advised.

## 2023-02-14 NOTE — ED Notes (Signed)
Pt left bf this nurse can get vitals
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
## 2023-02-14 NOTE — Progress Notes (Signed)
Orthopedic Tech Progress Note Patient Details:  Theresa Finley 02/04/1991 [id number]  Ortho Devices Type of Ortho Device: Knee Immobilizer Ortho Device/Splint Location: LLE Ortho Device/Splint Interventions: Ordered, Application, Adjustment   Post Interventions Patient Tolerated: Well Instructions Provided: Care of device, Adjustment of device  [name] 02/14/2023, 11:43 PM

## 2023-02-16 ENCOUNTER — Telehealth: Payer: Medicaid Other | Admitting: Family Medicine

## 2023-02-16 DIAGNOSIS — R0781 Pleurodynia: Secondary | ICD-10-CM

## 2023-02-16 DIAGNOSIS — R0789 Other chest pain: Secondary | ICD-10-CM

## 2023-02-16 NOTE — Patient Instructions (Signed)
Chest Wall Pain Chest wall pain is pain in or around the bones and muscles of your chest. Sometimes, an injury causes this pain. Excessive coughing or overuse of arm and chest muscles may also cause chest wall pain. Sometimes, the cause may not be known. This pain may take several weeks or longer to get better. Follow these instructions at home: Managing pain, stiffness, and swelling  If directed, put ice on the painful area: Put ice in a plastic bag. Place a towel between your skin and the bag. Leave the ice on for 20 minutes, 2-3 times per day. Activity Rest as told by your health care provider. Avoid activities that cause pain. These include any activities that use your chest muscles or your abdominal and side muscles to lift heavy items. Ask your health care provider what activities are safe for you. General instructions  Take over-the-counter and prescription medicines only as told by your health care provider. Do not use any products that contain nicotine or tobacco, such as cigarettes, e-cigarettes, and chewing tobacco. These can delay healing after injury. If you need help quitting, ask your health care provider. Keep all follow-up visits as told by your health care provider. This is important. Contact a health care provider if: You have a fever. Your chest pain becomes worse. You have new symptoms. Get help right away if: You have nausea or vomiting. You feel sweaty or light-headed. You have a cough with mucus from your lungs (sputum) or you cough up blood. You develop shortness of breath. These symptoms may represent a serious problem that is an emergency. Do not wait to see if the symptoms will go away. Get medical help right away. Call your local emergency services (911 in the [place] Do not drive yourself to the hospital. Summary Chest wall pain is pain in or around the bones and muscles of your chest. Depending on the cause, it may be treated with ice, rest, medicines, and  avoiding activities that cause pain. Contact a health care provider if you have a fever, worsening chest pain, or new symptoms. Get help right away if you feel light-headed or you develop shortness of breath. These symptoms may be an emergency. This information is not intended to replace advice given to you by your health care provider. Make sure you discuss any questions you have with your health care provider. Document Revised: 07/03/2022 Document Reviewed: 07/03/2022 Elsevier Patient Education  2024 Elsevier Inc.  

## 2023-02-16 NOTE — Progress Notes (Signed)
Pt in earlier. She is going to [facility] and wanted to know if we could order testing to be done there. I told her we are not [place] to order testing or referrals. She will go to [facility] for further evaluations. She also wants testing for STI's/DWB

## 2023-02-16 NOTE — Progress Notes (Signed)
Because Theresa Finley, I feel your condition warrants further evaluation and I recommend that you be seen in a face to face visit.    NOTE: There will be NO CHARGE for this eVisit   If you are having a true medical emergency please call 911.      For an urgent face to face visit, [facility] has eight urgent care centers for your convenience:   NEW!! [facility] Center at [facility] Get Driving Directions [phone] [address], [address] [place], [address]    [facility] Health Urgent Care Center at [facility] Get Driving Directions [phone] [address] [place], [place] [address]   [facility]) Get Driving Directions [phone] [address] [place], [place] [address]  [facility] Health Urgent Care Center [facility]) Get Driving Directions [phone] [address] [place],  [place]  [address]  [facility] Health Urgent Care Center [facility] - at [employer]  [phone] [address] [place],  [place] [address]   [facility] Health Urgent Care at [facility] Get Driving Directions [phone] [address] [place], [place] [address]   [facility] Health Urgent Care at [facility] Get Driving Directions  [phone] [address] [place], [place] [address]   [facility] Health Urgent Care at [facility] Directions [phone] [address], [address] [place], [place] [address]  Your MyChart E-visit questionnaire answers were reviewed by a board certified advanced clinical practitioner to complete your personal care plan based on your specific symptoms.  Thank you for using e-Visits.   Virtual Visit Consent   Bernardine L Peeler, you are scheduled for a virtual visit with a [facility] provider today. Just as with appointments in the office, your consent must be obtained to participate. Your consent will  be active for this visit and any virtual visit you may have with one of our providers in the next 365 days. If you have a MyChart account, a copy of this consent can be sent to you electronically.  As this is a virtual visit, video technology does not allow for your provider to perform a traditional examination. This may limit your provider's ability to fully assess your condition. If your provider identifies any concerns that need to be evaluated in person or the need to arrange testing (such as labs, EKG, etc.), we will make arrangements to do so. Although advances in technology are sophisticated, we cannot ensure that it will always work on either your end or our end. If the connection with a video visit is poor, the visit may have to be switched to a telephone visit. With either a video or telephone visit, we are not always [place] to ensure that we have a secure connection.  By engaging in this virtual visit, you consent to the provision of healthcare and authorize for your insurance to be billed (if applicable) for the services provided during this visit. Depending on your insurance coverage, you may receive a charge related to this service.  I need to obtain your verbal consent now. Are you willing to proceed with your visit today? Micheline L Vore has provided verbal consent on 02/16/2023 for a virtual visit (video or telephone). Georgana Curio, FNP  Date: 02/16/2023 8:09 AM  Virtual Visit via Video Note   I, Georgana Curio, connected with  Kamylah Yaccarino Vaness  ([id number], 05/08/1991) on 02/16/23 at  8:00 AM EDT by a  video-enabled telemedicine application and verified that I am speaking with the correct person using two identifiers.  Location: Patient: Virtual Visit Location Patient: Home Provider: Virtual Visit Location Provider: Home Office   I discussed the limitations of evaluation and management by telemedicine and the availability of in person appointments. The patient expressed understanding and  agreed to proceed.    History of Present Illness: Theresa Finley is a 32 y.o. who identifies as a female who was assigned female at birth, and is being seen today for rib pain. She has seen ortho and continues to have pain not relieved by pain meds given. Call disconnected during visit and pt did not respond to further efforts to reestablish connection. Marland Kitchen  HPI: HPI  Problems:  Patient Active Problem List   Diagnosis Date Noted   Limited prenatal care, antepartum 01/01/2021   Marijuana abuse 01/01/2021   History of precipitous delivery 01/01/2021   Category II fetal heart rate tracing during labor and delivery 01/01/2021   Pelvic pressure in pregnancy, antepartum, third trimester 12/28/2020   Gastroesophageal reflux in pregnancy in third trimester 12/14/2020   Normal labor 10/12/2019   Smoking 08/03/2019   Labor and delivery, indication for care 03/08/2018   Supervision of low-risk pregnancy 10/22/2017    Allergies: No Known Allergies Medications:  Current Outpatient Medications:    acetaminophen (TYLENOL) 325 MG tablet, Take 650 mg by mouth every 6 (six) hours as needed., Disp: , Rfl:    ibuprofen (ADVIL) 600 MG tablet, Take 1 tablet (600 mg total) by mouth every 6 (six) hours., Disp: 30 tablet, Rfl: 1   methocarbamol (ROBAXIN) 500 MG tablet, Take 1 tablet (500 mg total) by mouth every 8 (eight) hours as needed., Disp: 8 tablet, Rfl: 0  Observations/Objective: Pt declined turing on video saying there was "nothing to see".   Assessment and Plan: 1. Rib pain  Follow up at urgent care.   Follow Up Instructions: I discussed the assessment and treatment plan with the patient. The patient was provided an opportunity to ask questions and all were answered. The patient agreed with the plan and demonstrated an understanding of the instructions.  A copy of instructions were sent to the patient via MyChart unless otherwise noted below.     The patient was advised to call back or seek an  in-person evaluation if the symptoms worsen or if the condition fails to improve as anticipated.  Time:  I spent 10 minutes with the patient via telehealth technology discussing the above problems/concerns.    Georgana Curio, FNP

## 2023-06-10 ENCOUNTER — Telehealth: Payer: Medicaid Other

## 2023-06-10 ENCOUNTER — Telehealth: Payer: Medicaid Other | Admitting: Family

## 2023-06-10 DIAGNOSIS — L509 Urticaria, unspecified: Secondary | ICD-10-CM | POA: Diagnosis not present

## 2023-06-10 MED ORDER — PREDNISONE 10 MG (21) PO TBPK: ORAL_TABLET | ORAL | 0 refills | Status: DC

## 2023-06-10 MED ORDER — HYDROXYZINE PAMOATE 25 MG PO CAPS: 25.0000 mg | ORAL_CAPSULE | Freq: Three times a day (TID) | ORAL | 0 refills | Status: DC | PRN

## 2023-06-10 MED ORDER — CETIRIZINE HCL 10 MG PO TABS
10.0000 mg | ORAL_TABLET | Freq: Every day | ORAL | 1 refills | Status: AC
Start: 2023-06-10 — End: ?

## 2023-06-10 MED ORDER — FAMOTIDINE 20 MG PO TABS
20.0000 mg | ORAL_TABLET | Freq: Two times a day (BID) | ORAL | 1 refills | Status: AC
Start: 2023-06-10 — End: ?

## 2023-06-10 NOTE — Patient Instructions (Signed)
Hives Hives (urticaria) are itchy, red, swollen areas of skin. They can show up on any part of the body. They often fade within 24 hours (acute hives). If you get new hives after the old ones fade and the cycle goes on for many days or weeks, it is called chronic hives. Hives do not spread from person to person (are not contagious). Hives can happen when your body reacts to something you are allergic to (allergen) or to something that irritates your skin. When you are exposed to something that triggers hives, your body releases a chemical called histamine. This causes redness, itching, and swelling. Hives can show up right after you are exposed to a trigger or hours later. What are the causes? Hives may be caused by: Food allergies. Insect bites or stings. Allergies to pollen or pets. Spending time in sunlight, heat, or cold (exposure). Exercise. Stress. You can also get hives from other conditions and treatments. These include: Viruses, such as the common cold. Bacterial infections, such as urinary tract infections and strep throat. Certain medicines. Contact with latex or chemicals. Allergy shots. Blood transfusions. In some cases, the cause of hives is not known (idiopathic hives). What increases the risk? You are more likely to get hives if: You are female. You have food allergies. Hives are more common if you are allergic to citrus fruits, milk, eggs, peanuts, tree nuts, or shellfish. You are allergic to: Medicines. Latex. Insects. Animals. Pollen. What are the signs or symptoms? Common symptoms of hives include raised, itchy, red or white bumps or patches on your skin. These areas may: Become large and swollen (welts). Quickly change shape and location. This may happen more than once. Be separate hives or connect over a large area of skin. Sting or become painful. Turn white when pressed in the center (blanch). In severe cases, your hands, feet, and face may also become  swollen. This may happen if hives form deeper in your skin. How is this diagnosed? Hives may be diagnosed based on your symptoms, medical history, and a physical exam. You may have skin, pee (urine), or blood tests done. These can help find out what is causing your hives and rule out other health issues. You may also have a biopsy done. This is when a small piece of skin is removed for testing. How is this treated? Treatment for hives depends on the cause and on how severe your symptoms are. You may be told to use cool, wet cloths (cool compresses) or to take cool showers to relieve itching. Treatment may also include: Medicines to help: Relieve itching (antihistamines). Reduce swelling (corticosteroids). Treat infection (antibiotics). An injectable medicine called omalizumab. You may need this if you have chronic idiopathic hives and still have symptoms even after you are treated with antihistamines. In severe cases, you may need to use a device filled with medicine that gives an emergency shot of epinephrine (auto-injector pen) to prevent a very bad allergic reaction (anaphylactic reaction). Follow these instructions at home: Medicines Take and apply over-the-counter and prescription medicines only as told by your health care provider. If you were prescribed antibiotics, take them as told by your provider. Do not stop using the antibiotic even if you start to feel better. Skin care Apply cool compresses to the affected areas. Do not scratch or rub your skin. General instructions Do not take hot showers or baths. This can make itching worse. Do not wear tight-fitting clothing. Use sunscreen. Wear protective clothing when you are outside. Avoid   anything that causes your hives. Keep a journal to help track what causes your hives. Write down: What medicines you take. What you eat and drink. What products you use on your skin. Keep all follow-up visits. Your provider will track how well  treatment is working. Contact a health care provider if: Your symptoms do not get better with medicine. Your joints are painful or swollen. You have a fever. You have pain in your abdomen. Get help right away if: Your tongue, lips, or eyelids swell. Your chest or throat feels tight. You have trouble breathing or swallowing. These symptoms may be an emergency. Use the auto-injector pen right away. Then call 911. Do not wait to see if the symptoms will go away. Do not drive yourself to the hospital. This information is not intended to replace advice given to you by your health care provider. Make sure you discuss any questions you have with your health care provider. Document Revised: 04/06/2022 Document Reviewed: 03/28/2022 Elsevier Patient Education  2024 Elsevier Inc.  

## 2023-06-10 NOTE — Progress Notes (Signed)
Hello, The prescription I sent over was a dose pack. You will take 6 tablets the first day, 5 tablets the second, 4 tablets the third and so on. This should help.   Jannifer Rodney, FNP

## 2023-06-10 NOTE — Progress Notes (Signed)
Called and discussed with patient.   Jannifer Rodney, FNP

## 2023-06-10 NOTE — Progress Notes (Signed)
E Visit for Rash  We are sorry that you are not feeling well. Here is how we plan to help!   Based on what you shared with me you may have  an allergic reaction.   I recommend you take Benadryl 25 mg - 50 mg every 4 hours to control the symptoms but if they last over 24 hours it is best that you see an office based provider for follow up.    Prednisone 10 mg daily for 6 days (see taper instructions below)  Directions for 6 day taper: Day 1: 2 tablets before breakfast, 1 after both lunch & dinner and 2 at bedtime Day 2: 1 tab before breakfast, 1 after both lunch & dinner and 2 at bedtime Day 3: 1 tab at each meal & 1 at bedtime Day 4: 1 tab at breakfast, 1 at lunch, 1 at bedtime Day 5: 1 tab at breakfast & 1 tab at bedtime Day 6: 1 tab at breakfast     HOME CARE:  Take cool showers and avoid direct sunlight. Apply cool compress or wet dressings. Take a bath in an oatmeal bath.  Sprinkle content of one Aveeno packet under running faucet with comfortably warm water.  Bathe for 15-20 minutes, 1-2 times daily.  Pat dry with a towel. Do not rub the rash. Use hydrocortisone cream. Take an antihistamine like Benadryl for widespread rashes that itch.  The adult dose of Benadryl is 25-50 mg by mouth 4 times daily. Caution:  This type of medication may cause sleepiness.  Do not drink alcohol, drive, or operate dangerous machinery while taking antihistamines.  Do not take these medications if you have prostate enlargement.  Read package instructions thoroughly on all medications that you take.  GET HELP RIGHT AWAY IF:  Symptoms don't go away after treatment. Severe itching that persists. If you rash spreads or swells. If you rash begins to smell. If it blisters and opens or develops a yellow-brown crust. You develop a fever. You have a sore throat. You become short of breath.  MAKE SURE YOU:  Understand these instructions. Will watch your condition. Will get help right away if you  are not doing well or get worse.  Thank you for choosing an e-visit.  Your e-visit answers were reviewed by a board certified advanced clinical practitioner to complete your personal care plan. Depending upon the condition, your plan could have included both over the counter or prescription medications.  Please review your pharmacy choice. Make sure the pharmacy is open so you can pick up prescription now. If there is a problem, you may contact your provider through [employer] and have the prescription routed to another pharmacy.  Your safety is important to [place]. If you have drug allergies check your prescription carefully.   For the next 24 hours you can use MyChart to ask questions about today's visit, request a non-urgent call back, or ask for a work or school excuse. You will get an email in the next two days asking about your experience. I hope that your e-visit has been valuable and will speed your recovery.  Approximately 5 minutes was spent documenting and reviewing patient's chart.

## 2023-06-10 NOTE — Progress Notes (Signed)
Virtual Visit Consent   Theresa Finley, you are scheduled for a virtual visit with a [facility] provider today. Just as with appointments in the office, your consent must be obtained to participate. Your consent will be active for this visit and any virtual visit you may have with one of our providers in the next 365 days. If you have a MyChart account, a copy of this consent can be sent to you electronically.  As this is a virtual visit, video technology does not allow for your provider to perform a traditional examination. This may limit your provider's ability to fully assess your condition. If your provider identifies any concerns that need to be evaluated in person or the need to arrange testing (such as labs, EKG, etc.), we will make arrangements to do so. Although advances in technology are sophisticated, we cannot ensure that it will always work on either your end or our end. If the connection with a video visit is poor, the visit may have to be switched to a telephone visit. With either a video or telephone visit, we are not always [place] to ensure that we have a secure connection.  By engaging in this virtual visit, you consent to the provision of healthcare and authorize for your insurance to be billed (if applicable) for the services provided during this visit. Depending on your insurance coverage, you may receive a charge related to this service.  I need to obtain your verbal consent now. Are you willing to proceed with your visit today? Theresa Finley has provided verbal consent on 06/10/2023 for a virtual visit (video or telephone). Theresa Rodney, FNP  Date: 06/10/2023 7:28 PM  Virtual Visit via Video Note   I, Theresa Finley, connected with  Theresa Finley  ([id number], July 07, 1991) on 06/10/23 at  7:15 PM EST by a video-enabled telemedicine application and verified that I am speaking with the correct person using two identifiers.  Location: Patient: Virtual Visit Location Patient:  Home Provider: Virtual Visit Location Provider: Home Office   I discussed the limitations of evaluation and management by telemedicine and the availability of in person appointments. The patient expressed understanding and agreed to proceed.    History of Present Illness: Theresa Finley is a 32 y.o. who identifies as a female who was assigned female at birth, and is being seen today for HIVES.  HPI: Rash This is a new problem. The current episode started 1 to 4 weeks ago. The problem has been gradually worsening since onset. The affected locations include the head, abdomen, left buttock, right lower leg, right upper leg, right buttock and right shoulder. The rash is characterized by itchiness.    Problems:  Patient Active Problem List   Diagnosis Date Noted   Limited prenatal care, antepartum 01/01/2021   Marijuana abuse 01/01/2021   History of precipitous delivery 01/01/2021   Category II fetal heart rate tracing during labor and delivery 01/01/2021   Pelvic pressure in pregnancy, antepartum, third trimester 12/28/2020   Gastroesophageal reflux in pregnancy in third trimester 12/14/2020   Normal labor 10/12/2019   Smoking 08/03/2019   Labor and delivery, indication for care 03/08/2018   Supervision of low-risk pregnancy 10/22/2017    Allergies: No Known Allergies Medications:  Current Outpatient Medications:    cetirizine (ZYRTEC ALLERGY) 10 MG tablet, Take 1 tablet (10 mg total) by mouth daily., Disp: 90 tablet, Rfl: 1   famotidine (PEPCID) 20 MG tablet, Take 1 tablet (20 mg total) by mouth 2 (  two) times daily., Disp: 60 tablet, Rfl: 1   acetaminophen (TYLENOL) 325 MG tablet, Take 650 mg by mouth every 6 (six) hours as needed., Disp: , Rfl:    hydrOXYzine (VISTARIL) 25 MG capsule, Take 1 capsule (25 mg total) by mouth every 8 (eight) hours as needed., Disp: 30 capsule, Rfl: 0   ibuprofen (ADVIL) 600 MG tablet, Take 1 tablet (600 mg total) by mouth every 6 (six) hours., Disp: 30  tablet, Rfl: 1   methocarbamol (ROBAXIN) 500 MG tablet, Take 1 tablet (500 mg total) by mouth every 8 (eight) hours as needed., Disp: 8 tablet, Rfl: 0   predniSONE (STERAPRED UNI-PAK 21 TAB) 10 MG (21) TBPK tablet, Use as directed, Disp: 21 tablet, Rfl: 0  Observations/Objective: Patient is well-developed, well-nourished in no acute distress.  Resting comfortably  at home.  Head is normocephalic, atraumatic.  No labored breathing.  Speech is clear and coherent with logical content.  Patient is alert and oriented at baseline.  Hives on chest, back, and arms  Assessment and Plan: 1. Hives - cetirizine (ZYRTEC ALLERGY) 10 MG tablet; Take 1 tablet (10 mg total) by mouth daily.  Dispense: 90 tablet; Refill: 1 - famotidine (PEPCID) 20 MG tablet; Take 1 tablet (20 mg total) by mouth 2 (two) times daily.  Dispense: 60 tablet; Refill: 1  Continue the prednisone, vistaril, and benadryl  Start the zyrtec and pepcid 20 mg BID Discussed possible triggers, unsure what triggers  Recommend if symptoms continue to be seen in person  Follow Up Instructions: I discussed the assessment and treatment plan with the patient. The patient was provided an opportunity to ask questions and all were answered. The patient agreed with the plan and demonstrated an understanding of the instructions.  A copy of instructions were sent to the patient via MyChart unless otherwise noted below.    The patient was advised to call back or seek an in-person evaluation if the symptoms worsen or if the condition fails to improve as anticipated.    Theresa Rodney, FNP

## 2023-06-10 NOTE — Progress Notes (Signed)
PT completed Evisit and steroids were sent in for patient. Will send to a different pharmacy and send in rx of vistaril to help with itching.

## 2023-06-11 ENCOUNTER — Telehealth: Payer: Medicaid Other

## 2023-06-11 ENCOUNTER — Encounter (HOSPITAL_COMMUNITY): Payer: Self-pay

## 2023-06-11 ENCOUNTER — Telehealth: Payer: Medicaid Other | Admitting: Physician Assistant

## 2023-06-11 ENCOUNTER — Ambulatory Visit: Payer: Medicaid Other

## 2023-06-11 ENCOUNTER — Ambulatory Visit (HOSPITAL_COMMUNITY)
Admission: EM | Admit: 2023-06-11 | Discharge: 2023-06-11 | Disposition: A | Discharge status: Home / Self Care | Payer: Medicaid Other | Attending: Emergency Medicine | Admitting: Emergency Medicine

## 2023-06-11 DIAGNOSIS — H938X3 Other specified disorders of ear, bilateral: Secondary | ICD-10-CM

## 2023-06-11 DIAGNOSIS — L509 Urticaria, unspecified: Secondary | ICD-10-CM | POA: Diagnosis not present

## 2023-06-11 DIAGNOSIS — L508 Other urticaria: Secondary | ICD-10-CM

## 2023-06-11 MED ORDER — GUAIFENESIN ER 600 MG PO TB12: 1200.0000 mg | ORAL_TABLET | Freq: Two times a day (BID) | ORAL | 0 refills | Status: DC

## 2023-06-11 MED ORDER — HYDROXYZINE HCL 25 MG PO TABS: 25.0000 mg | ORAL_TABLET | Freq: Four times a day (QID) | ORAL | 0 refills | Status: DC

## 2023-06-11 MED ORDER — DEXAMETHASONE SODIUM PHOSPHATE 10 MG/ML IJ SOLN
10.0000 mg | Freq: Once | INTRAMUSCULAR | Status: AC
  Administered 2023-06-11: 10 mg via INTRAMUSCULAR

## 2023-06-11 MED ORDER — DEXAMETHASONE SODIUM PHOSPHATE 10 MG/ML IJ SOLN
INTRAMUSCULAR | Status: AC
  Filled 2023-06-11: qty 1

## 2023-06-11 NOTE — Progress Notes (Signed)
The patient no-showed for appointment despite this provider sending direct link with no response and waiting for at least 10 minutes from appointment time for patient to join. They will be marked as a NS for this appointment/time.   Maryon Kemnitz M Kenzie Flakes, PA-C    

## 2023-06-11 NOTE — ED Triage Notes (Signed)
Pt presents with hives "all over body" x 3 weeks. Pt states "I am itching all over my body and I am so uncomfortable. I have tried Benadryl and OTC allergy medications but nothing is helping. I stayed up most of the night itching."   Pt is also reporting bilateral ear fullness x "a long time." Pt states "I feel like they are clogged up but when I try to clean them, nothing comes out." Pt reports she has tried coconut oil, peroxide, OTC ear drops with no improvement.

## 2023-06-11 NOTE — Discharge Instructions (Addendum)
It is unclear what is causing your itching, continue taking the medications you have have prescribed here and we have given you a steroid shot in clinic to give you additional anti-inflammatory relief.  I suggest washing all your sheets and close in hot water with an hypoallergenic detergent.  Do not use any dryer sheets or anything with scents to provoke skin irritation.  Wash your hands with a mild soap and water.  Wash your body with a hypoallergenic soap like Dove sensitive skin.  It is important to follow-up with a primary care provider, as they may refer you to dermatology for further evaluation of your continued itching.   Please stop the Vistaril and start the Atarax, this may work better for you.  I also sent in some Mucinex to see if this helps with your ear fullness.

## 2023-06-11 NOTE — Patient Instructions (Signed)
Theresa Finley, thank you for joining Margaretann Loveless, PA-C for today's virtual visit.  While this provider is not your primary care provider (PCP), if your PCP is located in our provider database this encounter information will be shared with them immediately following your visit.   A [facility] MyChart account gives you access to today's visit and all your visits, tests, and labs performed at [facility] " click here if you don't have a [facility] MyChart account or go to [email or web address]  Consent: (Patient) Theresa Finley provided verbal consent for this virtual visit at the beginning of the encounter.  Current Medications:  Current Outpatient Medications:    acetaminophen (TYLENOL) 325 MG tablet, Take 650 mg by mouth every 6 (six) hours as needed., Disp: , Rfl:    cetirizine (ZYRTEC ALLERGY) 10 MG tablet, Take 1 tablet (10 mg total) by mouth daily., Disp: 90 tablet, Rfl: 1   famotidine (PEPCID) 20 MG tablet, Take 1 tablet (20 mg total) by mouth 2 (two) times daily., Disp: 60 tablet, Rfl: 1   hydrOXYzine (VISTARIL) 25 MG capsule, Take 1 capsule (25 mg total) by mouth every 8 (eight) hours as needed., Disp: 30 capsule, Rfl: 0   ibuprofen (ADVIL) 600 MG tablet, Take 1 tablet (600 mg total) by mouth every 6 (six) hours., Disp: 30 tablet, Rfl: 1   methocarbamol (ROBAXIN) 500 MG tablet, Take 1 tablet (500 mg total) by mouth every 8 (eight) hours as needed., Disp: 8 tablet, Rfl: 0   predniSONE (STERAPRED UNI-PAK 21 TAB) 10 MG (21) TBPK tablet, Use as directed, Disp: 21 tablet, Rfl: 0   Medications ordered in this encounter:  No orders of the defined types were placed in this encounter.    *If you need refills on other medications prior to your next appointment, please contact your pharmacy*  Follow-Up: Call back or seek an in-person evaluation if the symptoms worsen or if the condition fails to improve as anticipated.  [facility] [phone]  Other Instructions Hives Hives (urticaria) are itchy, red, swollen areas of skin. They can show up on any part of the body. They often fade within 24 hours (acute hives). If you get new hives after the old ones fade and the cycle goes on for many days or weeks, it is called chronic hives. Hives do not spread from person to person (are not contagious). Hives can happen when your body reacts to something you are allergic to (allergen) or to something that irritates your skin. When you are exposed to something that triggers hives, your body releases a chemical called histamine. This causes redness, itching, and swelling. Hives can show up right after you are exposed to a trigger or hours later. What are the causes? Hives may be caused by: Food allergies. Insect bites or stings. Allergies to pollen or pets. Spending time in sunlight, heat, or cold (exposure). Exercise. Stress. You can also get hives from other conditions and treatments. These include: Viruses, such as the common cold. Bacterial infections, such as urinary tract infections and strep throat. Certain medicines. Contact with latex or chemicals. Allergy shots. Blood transfusions. In some cases, the cause of hives is not known (idiopathic hives). What increases the risk? You are more likely to get hives if: You are female. You have food allergies. Hives are more common if you are allergic to citrus fruits, milk, eggs, peanuts, tree nuts, or shellfish. You are allergic to: Medicines. Latex. Insects. Animals. Pollen. What are  the signs or symptoms? Common symptoms of hives include raised, itchy, red or white bumps or patches on your skin. These areas may: Become large and swollen (welts). Quickly change shape and location. This may happen more than once. Be separate hives or connect over a large area of skin. Sting or become painful. Turn white when pressed in the center (blanch). In severe cases, your hands,  feet, and face may also become swollen. This may happen if hives form deeper in your skin. How is this diagnosed? Hives may be diagnosed based on your symptoms, medical history, and a physical exam. You may have skin, pee (urine), or blood tests done. These can help find out what is causing your hives and rule out other health issues. You may also have a biopsy done. This is when a small piece of skin is removed for testing. How is this treated? Treatment for hives depends on the cause and on how severe your symptoms are. You may be told to use cool, wet cloths (cool compresses) or to take cool showers to relieve itching. Treatment may also include: Medicines to help: Relieve itching (antihistamines). Reduce swelling (corticosteroids). Treat infection (antibiotics). An injectable medicine called omalizumab. You may need this if you have chronic idiopathic hives and still have symptoms even after you are treated with antihistamines. In severe cases, you may need to use a device filled with medicine that gives an emergency shot of epinephrine (auto-injector pen) to prevent a very bad allergic reaction (anaphylactic reaction). Follow these instructions at home: Medicines Take and apply over-the-counter and prescription medicines only as told by your health care provider. If you were prescribed antibiotics, take them as told by your provider. Do not stop using the antibiotic even if you start to feel better. Skin care Apply cool compresses to the affected areas. Do not scratch or rub your skin. General instructions Do not take hot showers or baths. This can make itching worse. Do not wear tight-fitting clothing. Use sunscreen. Wear protective clothing when you are outside. Avoid anything that causes your hives. Keep a journal to help track what causes your hives. Write down: What medicines you take. What you eat and drink. What products you use on your skin. Keep all follow-up visits. Your  provider will track how well treatment is working. Contact a health care provider if: Your symptoms do not get better with medicine. Your joints are painful or swollen. You have a fever. You have pain in your abdomen. Get help right away if: Your tongue, lips, or eyelids swell. Your chest or throat feels tight. You have trouble breathing or swallowing. These symptoms may be an emergency. Use the auto-injector pen right away. Then call 911. Do not wait to see if the symptoms will go away. Do not drive yourself to the hospital. This information is not intended to replace advice given to you by your health care provider. Make sure you discuss any questions you have with your health care provider. Document Revised: 04/06/2022 Document Reviewed: 03/28/2022 Elsevier Patient Education  2024 Elsevier Inc.    If you have been instructed to have an in-person evaluation today at a local Urgent Care facility, please use the link below. It will take you to a list of all of our available [facility], including address, phone number and hours of operation. Please do not delay care.  [facility]  If you or a family member do not have a primary care provider, use the link  below to schedule a visit and establish care. When you choose a [facility] primary care physician or advanced practice provider, you gain a long-term partner in health. Find a Primary Care Provider  Learn more about [facility] in-office and virtual care options:  - Get Care Now

## 2023-06-11 NOTE — ED Provider Notes (Signed)
[facility]    CSN: [id number] Arrival date & time: 06/11/23  1403      History   Chief Complaint Chief Complaint  Patient presents with   Urticaria   Ear Fullness    HPI Theresa Finley is a 32 y.o. female.   Patient presents to clinic complaining of itching all over her entire body for the past 3 weeks.  Reports she is very uncomfortable.  She is scratching in the room with a card at her ankles and her wrist.  Reports scratching through her jacket and her clothing to not irritate her skin.  She was seen 4 times yesterday virtually and placed on a prednisone taper, famotidine and hydroxyzine.  Reports compliance with this and has taken the prednisone again this morning.  She does not currently have a primary care provider.  She is also having bilateral ear fullness and pressure, feels like her equilibrium is off.  No drainage.  She has tried coconut oil, peroxide and over-the-counter eardrops without much improvement.  She uses marijuana, denies any other drug use. No rash.     The history is provided by the patient and medical records.  Urticaria  Ear Fullness    Past Medical History:  Diagnosis Date   Medical history non-contributory    Trichomonas infection 01/30/2018   Neg 12/15/17    Patient Active Problem List   Diagnosis Date Noted   Limited prenatal care, antepartum 01/01/2021   Marijuana abuse 01/01/2021   History of precipitous delivery 01/01/2021   Category II fetal heart rate tracing during labor and delivery 01/01/2021   Pelvic pressure in pregnancy, antepartum, third trimester 12/28/2020   Gastroesophageal reflux in pregnancy in third trimester 12/14/2020   Normal labor 10/12/2019   Smoking 08/03/2019   Labor and delivery, indication for care 03/08/2018   Supervision of low-risk pregnancy 10/22/2017    Past Surgical History:  Procedure Laterality Date   CAPSULAR RELEASE Right 10/15/2018   Procedure: Right elbow hardware removal,  arthrotomy and synovectomy right elbow with capsular release;  Surgeon: Dominica Severin, MD;  Location: [facility];  Service: Orthopedics;  Laterality: Right;  2 hrs for both procedures   HARDWARE REMOVAL Right 10/15/2018   Procedure: HARDWARE REMOVAL;  Surgeon: Dominica Severin, MD;  Location: [facility];  Service: Orthopedics;  Laterality: Right;   ORIF RADIAL FRACTURE  07/27/2012   Procedure: OPEN REDUCTION INTERNAL FIXATION (ORIF) RADIAL FRACTURE;  Surgeon: Dominica Severin, MD;  Location: [facility];  Service: Orthopedics;  Laterality: Right;  ORIF Right Radial Head Fracture    OB History     Gravida  6   Para  5   Term  5   Preterm  0   AB  1   Living  5      SAB  1   IAB  0   Ectopic  0   Multiple  0   Live Births  5            Home Medications    Prior to Admission medications   Medication Sig Start Date End Date Taking? Authorizing Provider  acetaminophen (TYLENOL) 325 MG tablet Take 650 mg by mouth every 6 (six) hours as needed.    [provider]  cetirizine (ZYRTEC ALLERGY) 10 MG tablet Take 1 tablet (10 mg total) by mouth daily. 06/10/23   Junie Spencer, FNP  famotidine (PEPCID) 20 MG tablet Take 1 tablet (20 mg total) by mouth 2 (two)  times daily. 06/10/23   Junie Spencer, FNP  hydrOXYzine (VISTARIL) 25 MG capsule Take 1 capsule (25 mg total) by mouth every 8 (eight) hours as needed. 06/10/23   Jannifer Rodney A, FNP  ibuprofen (ADVIL) 600 MG tablet Take 1 tablet (600 mg total) by mouth every 6 (six) hours. 01/03/21   Hildred Laser, MD  methocarbamol (ROBAXIN) 500 MG tablet Take 1 tablet (500 mg total) by mouth every 8 (eight) hours as needed. 02/14/23   Benjiman Core, MD  predniSONE (STERAPRED UNI-PAK 21 TAB) 10 MG (21) TBPK tablet Use as directed 06/10/23   Junie Spencer, FNP    Family History Family History  Problem Relation Age of Onset   Cancer Mother     Social History Social History   Tobacco Use    Smoking status: Former    Current packs/day: 0.00    Types: Cigarettes    Quit date: 02/18/2013    Years since quitting: 10.3   Smokeless tobacco: Never   Tobacco comments:    Patient reports that she rarly smokes  Vaping Use   Vaping status: Never Used  Substance Use Topics   Alcohol use: No   Drug use: Not Currently    Types: Marijuana    Comment: last use of weed 02/16/2018     Allergies   Patient has no known allergies.   Review of Systems Review of Systems   Physical Exam Triage Vital Signs ED Triage Vitals  Encounter Vitals Group     BP 06/11/23 1540 (!) 142/80     Systolic BP Percentile --      Diastolic BP Percentile --      Pulse Rate 06/11/23 1540 (!) 122     Resp 06/11/23 1540 18     Temp 06/11/23 1540 99 F (37.2 C)     Temp Source 06/11/23 1540 Oral     SpO2 06/11/23 1540 98 %     Weight 06/11/23 1548 117 lb (53.1 kg)     Height 06/11/23 1548 5\' 4"  (1.626 m)     Head Circumference --      Peak Flow --      Pain Score 06/11/23 1548 10     Pain Loc --      Pain Education --      Exclude from Growth Chart --    No data found.  Updated Vital Signs BP (!) 142/80 (BP Location: Left Arm)   Pulse (!) 122   Temp 99 F (37.2 C) (Oral)   Resp 18   Ht 5\' 4"  (1.626 m)   Wt 117 lb (53.1 kg)   LMP 05/28/2023 (Exact Date)   SpO2 98%   Breastfeeding No   BMI 20.08 kg/m   Visual Acuity Right Eye Distance:   Left Eye Distance:   Bilateral Distance:    Right Eye Near:   Left Eye Near:    Bilateral Near:     Physical Exam Vitals and nursing note reviewed.  Constitutional:      Appearance: Normal appearance.  HENT:     Head: Normocephalic and atraumatic.     Right Ear: External ear normal.     Left Ear: External ear normal.     Nose: Nose normal.     Mouth/Throat:     Mouth: Mucous membranes are moist.  Eyes:     Conjunctiva/sclera: Conjunctivae normal.  Cardiovascular:     Rate and Rhythm: Normal rate.  Pulmonary:     Effort: Pulmonary  effort is normal. No respiratory distress.  Musculoskeletal:        General: Normal range of motion.  Skin:    General: Skin is warm and dry.  Neurological:     General: No focal deficit present.     Mental Status: She is alert.  Psychiatric:        Mood and Affect: Mood normal.      UC Treatments / Results  Labs (all labs ordered are listed, but only abnormal results are displayed) Labs Reviewed - No data to display  EKG   Radiology No results found.  Procedures Procedures (including critical care time)  Medications Ordered in UC Medications  dexamethasone (DECADRON) injection 10 mg (has no administration in time range)    Initial Impression / Assessment and Plan / UC Course  I have reviewed the triage vital signs and the nursing notes.  Pertinent labs & imaging results that were available during my care of the patient were reviewed by me and considered in my medical decision making (see chart for details).  Vitals and triage reviewed, patient is hemodynamically stable.  She is anxious in the room and itching her skin with a cord.  No obvious rashes or hives on physical exam.  She is already on oral steroids, antihistamine and famotidine.  Unclear etiology to urticaria, denies drug use besides marijuana.  Encouraged follow-up with primary care provider and then potentially dermatology.  Hypoallergenic care routine discussed.  Will trial IM steroid.  Plan of care, follow-up care return precautions given, no questions at this time.     Final Clinical Impressions(s) / UC Diagnoses   Final diagnoses:  Urticaria     Discharge Instructions      It is unclear what is causing your itching, continue taking the medications you have have prescribed here and we have given you a steroid shot in clinic to give you additional anti-inflammatory relief.  I suggest washing all your sheets and close in hot water with an hypoallergenic detergent.  Do not use any dryer sheets or  anything with scents to provoke skin irritation.  Wash your hands with a mild soap and water.  Wash your body with a hypoallergenic soap like Dove sensitive skin.  It is important to follow-up with a primary care provider, as they may refer you to dermatology for further evaluation of your continued itching.          ED Prescriptions   None    PDMP not reviewed this encounter.   Hashir Deleeuw, [place], [place] 06/11/23 [phone]

## 2023-06-11 NOTE — Progress Notes (Signed)
Virtual Visit Consent   Theresa Finley, you are scheduled for a virtual visit with a [facility] provider today. Just as with appointments in the office, your consent must be obtained to participate. Your consent will be active for this visit and any virtual visit you may have with one of our providers in the next 365 days. If you have a MyChart account, a copy of this consent can be sent to you electronically.  As this is a virtual visit, video technology does not allow for your provider to perform a traditional examination. This may limit your provider's ability to fully assess your condition. If your provider identifies any concerns that need to be evaluated in person or the need to arrange testing (such as labs, EKG, etc.), we will make arrangements to do so. Although advances in technology are sophisticated, we cannot ensure that it will always work on either your end or our end. If the connection with a video visit is poor, the visit may have to be switched to a telephone visit. With either a video or telephone visit, we are not always [place] to ensure that we have a secure connection.  By engaging in this virtual visit, you consent to the provision of healthcare and authorize for your insurance to be billed (if applicable) for the services provided during this visit. Depending on your insurance coverage, you may receive a charge related to this service.  I need to obtain your verbal consent now. Are you willing to proceed with your visit today? Theresa Finley has provided verbal consent on 06/11/2023 for a virtual visit (video or telephone). Margaretann Loveless, PA-C  Date: 06/11/2023 12:41 PM  Virtual Visit via Video Note   IMargaretann Loveless, connected with  Theresa Finley  ([id number], Jan 11, 1991) on 06/11/23 at 12:15 PM EST by a video-enabled telemedicine application and verified that I am speaking with the correct person using two identifiers.  Location: Patient: Virtual Visit  Location Patient: Home Provider: Virtual Visit Location Provider: Home Office   I discussed the limitations of evaluation and management by telemedicine and the availability of in person appointments. The patient expressed understanding and agreed to proceed.    History of Present Illness: Theresa Finley is a 32 y.o. who identifies as a female who was assigned female at birth, and is being seen today for continued hives, discomfort and anxiety. Was seen yesterday with 4 virtual visits for same issue (2 e-visits and 2 Video). Was prescribed Prednisone 6 day taper, Famotidine 20mg  twice daily, Zyrtec 10mg  daily, and Hydroxyzine 25mg  three times daily as needed for itching and anxiety. She reports no relief. Symptoms go away for an hour or two then return. No known trigger. Admits to having stress and anxiety acutely right now.   Problems:  Patient Active Problem List   Diagnosis Date Noted   Limited prenatal care, antepartum 01/01/2021   Marijuana abuse 01/01/2021   History of precipitous delivery 01/01/2021   Category II fetal heart rate tracing during labor and delivery 01/01/2021   Pelvic pressure in pregnancy, antepartum, third trimester 12/28/2020   Gastroesophageal reflux in pregnancy in third trimester 12/14/2020   Normal labor 10/12/2019   Smoking 08/03/2019   Labor and delivery, indication for care 03/08/2018   Supervision of low-risk pregnancy 10/22/2017    Allergies: No Known Allergies Medications:  Current Outpatient Medications:    acetaminophen (TYLENOL) 325 MG tablet, Take 650 mg by mouth every 6 (six) hours as needed., Disp: ,  Rfl:    cetirizine (ZYRTEC ALLERGY) 10 MG tablet, Take 1 tablet (10 mg total) by mouth daily., Disp: 90 tablet, Rfl: 1   famotidine (PEPCID) 20 MG tablet, Take 1 tablet (20 mg total) by mouth 2 (two) times daily., Disp: 60 tablet, Rfl: 1   hydrOXYzine (VISTARIL) 25 MG capsule, Take 1 capsule (25 mg total) by mouth every 8 (eight) hours as needed.,  Disp: 30 capsule, Rfl: 0   ibuprofen (ADVIL) 600 MG tablet, Take 1 tablet (600 mg total) by mouth every 6 (six) hours., Disp: 30 tablet, Rfl: 1   methocarbamol (ROBAXIN) 500 MG tablet, Take 1 tablet (500 mg total) by mouth every 8 (eight) hours as needed., Disp: 8 tablet, Rfl: 0   predniSONE (STERAPRED UNI-PAK 21 TAB) 10 MG (21) TBPK tablet, Use as directed, Disp: 21 tablet, Rfl: 0  Observations/Objective: Patient is well-developed, well-nourished in no acute distress.  Resting comfortably  Head is normocephalic, atraumatic.  No labored breathing.  Speech is clear and coherent with logical content.  Patient is alert and oriented at baseline.    Assessment and Plan: 1. Chronic urticaria  - Most likely chronic, stress-induced urticaria - Discussed can take longer than 1 day to fully resolve symptoms and may require longer term management with continued zyrtec and famotidine daily - Advised she could be seen in person at a local Urgent Care for consideration of an IM steroid to see if that will lessen symptoms quicker, then to continue the other aforementioned medications.  Follow Up Instructions: I discussed the assessment and treatment plan with the patient. The patient was provided an opportunity to ask questions and all were answered. The patient agreed with the plan and demonstrated an understanding of the instructions.  A copy of instructions were sent to the patient via MyChart unless otherwise noted below.    The patient was advised to call back or seek an in-person evaluation if the symptoms worsen or if the condition fails to improve as anticipated.    Margaretann Loveless, PA-C

## 2023-06-12 ENCOUNTER — Ambulatory Visit: Payer: Medicaid Other

## 2023-06-15 ENCOUNTER — Emergency Department (HOSPITAL_COMMUNITY): Payer: Medicaid Other

## 2023-06-15 ENCOUNTER — Other Ambulatory Visit: Payer: Self-pay

## 2023-06-15 ENCOUNTER — Encounter (HOSPITAL_COMMUNITY): Payer: Self-pay

## 2023-06-15 ENCOUNTER — Telehealth: Payer: Medicaid Other | Admitting: Physician Assistant

## 2023-06-15 ENCOUNTER — Emergency Department (HOSPITAL_COMMUNITY)
Admission: EM | Admit: 2023-06-15 | Discharge: 2023-06-15 | Disposition: A | Discharge status: Home / Self Care | Payer: Medicaid Other | Attending: Student | Admitting: Student

## 2023-06-15 ENCOUNTER — Telehealth: Payer: Medicaid Other | Admitting: Nurse Practitioner

## 2023-06-15 DIAGNOSIS — M25561 Pain in right knee: Secondary | ICD-10-CM | POA: Insufficient documentation

## 2023-06-15 DIAGNOSIS — S61257A Open bite of left little finger without damage to nail, initial encounter: Secondary | ICD-10-CM | POA: Insufficient documentation

## 2023-06-15 DIAGNOSIS — L509 Urticaria, unspecified: Secondary | ICD-10-CM | POA: Diagnosis not present

## 2023-06-15 DIAGNOSIS — M25521 Pain in right elbow: Secondary | ICD-10-CM | POA: Diagnosis not present

## 2023-06-15 DIAGNOSIS — W503XXA Accidental bite by another person, initial encounter: Secondary | ICD-10-CM

## 2023-06-15 DIAGNOSIS — Z87891 Personal history of nicotine dependence: Secondary | ICD-10-CM | POA: Insufficient documentation

## 2023-06-15 DIAGNOSIS — S62639A Displaced fracture of distal phalanx of unspecified finger, initial encounter for closed fracture: Secondary | ICD-10-CM

## 2023-06-15 MED ORDER — DIPHENHYDRAMINE HCL 50 MG/ML IJ SOLN
25.0000 mg | Freq: Once | INTRAMUSCULAR | Status: AC
  Administered 2023-06-15: 25 mg via INTRAVENOUS
  Filled 2023-06-15: qty 1

## 2023-06-15 MED ORDER — KETOROLAC TROMETHAMINE 15 MG/ML IJ SOLN
15.0000 mg | Freq: Once | INTRAMUSCULAR | Status: AC
  Administered 2023-06-15: 15 mg via INTRAMUSCULAR
  Filled 2023-06-15 (×2): qty 1

## 2023-06-15 MED ORDER — PREDNISONE 20 MG PO TABS: 20.0000 mg | ORAL_TABLET | Freq: Two times a day (BID) | ORAL | 0 refills | Status: DC

## 2023-06-15 MED ORDER — TETANUS-DIPHTH-ACELL PERTUSSIS 5-2.5-18.5 LF-MCG/0.5 IM SUSY
0.5000 mL | PREFILLED_SYRINGE | Freq: Once | INTRAMUSCULAR | Status: DC
  Filled 2023-06-15: qty 0.5

## 2023-06-15 MED ORDER — PROCHLORPERAZINE EDISYLATE 10 MG/2ML IJ SOLN
10.0000 mg | Freq: Once | INTRAMUSCULAR | Status: AC
  Administered 2023-06-15: 10 mg via INTRAVENOUS
  Filled 2023-06-15: qty 2

## 2023-06-15 MED ORDER — AMOXICILLIN-POT CLAVULANATE 875-125 MG PO TABS
1.0000 | ORAL_TABLET | Freq: Once | ORAL | Status: AC
  Administered 2023-06-15: 1 via ORAL
  Filled 2023-06-15: qty 1

## 2023-06-15 MED ORDER — AMOXICILLIN-POT CLAVULANATE 875-125 MG PO TABS: 1.0000 | ORAL_TABLET | Freq: Two times a day (BID) | ORAL | 0 refills | Status: DC

## 2023-06-15 MED ORDER — NAPROXEN 375 MG PO TABS: 375.0000 mg | ORAL_TABLET | Freq: Two times a day (BID) | ORAL | 0 refills | Status: DC
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
## 2023-06-15 NOTE — Progress Notes (Signed)
The patient no-showed for appointment despite this provider sending direct link with no response and waiting for at least 10 minutes from appointment time for patient to join. They will be marked as a NS for this appointment/time.   Margaretann Loveless, PA-C

## 2023-06-15 NOTE — Progress Notes (Signed)
The patient no-showed for appointment despite this provider waiting for at least 10 minutes from appointment time for patient to join. They will be marked as a NS for this appointment/time.   Margaretann Loveless, PA-C

## 2023-06-15 NOTE — ED Notes (Signed)
Pt. Attempted to elope w/ IV still in arm. When tech tried to remove IV, Pt. Ripped IV out of arm and did not allow tech to wrap the site. Pt. Stated, "my mom is outside and I have to give her something". Pt. Left w/o being discharged

## 2023-06-15 NOTE — ED Notes (Signed)
Tech attempted to apply finger splint, Pt. Stated, "I can do that at home. I just want my discharge papers."

## 2023-06-15 NOTE — Progress Notes (Signed)
Virtual Visit Consent   Theresa Finley, you are scheduled for a virtual visit with a  provider today. Just as with appointments in the office, your consent must be obtained to participate. Your consent will be active for this visit and any virtual visit you may have with one of our providers in the next 365 days. If you have a MyChart account, a copy of this consent can be sent to you electronically.  As this is a virtual visit, video technology does not allow for your provider to perform a traditional examination. This may limit your provider's ability to fully assess your condition. If your provider identifies any concerns that need to be evaluated in person or the need to arrange testing (such as labs, EKG, etc.), we will make arrangements to do so. Although advances in technology are sophisticated, we cannot ensure that it will always work on either your end or our end. If the connection with a video visit is poor, the visit may have to be switched to a telephone visit. With either a video or telephone visit, we are not always [place] to ensure that we have a secure connection.  By engaging in this virtual visit, you consent to the provision of healthcare and authorize for your insurance to be billed (if applicable) for the services provided during this visit. Depending on your insurance coverage, you may receive a charge related to this service.  I need to obtain your verbal consent now. Are you willing to proceed with your visit today? [name] has provided verbal consent on 06/15/2023 for a virtual visit (video or telephone). Viviano Simas, FNP  Date: 06/15/2023 6:15 PM  Virtual Visit via Video Note   I, Viviano Simas, connected with  Theresa Finley  ([id number], March 28, 1991) on 06/15/23 at  6:15 PM EST by a video-enabled telemedicine application and verified that I am speaking with the correct person using two identifiers.  Location: Patient: Virtual Visit Location Patient:  Home Provider: Virtual Visit Location Provider: Home Office   I discussed the limitations of evaluation and management by telemedicine and the availability of in person appointments. The patient expressed understanding and agreed to proceed.    History of Present Illness: Theresa Finley is a 32 y.o. who identifies as a female who was assigned female at birth, and is being seen today for complaints of ongoing hives   She finished her prednisone taper today  She states that the hives have been present for the past month   This was her first course of oral prednisone   She has been prescribed hydroxyzine 25 once daily that she continues each night  She also takes Pepcid twice daily and Zyrtec once daily   Mother has a similar condition for which she gets monthly steroid injections   Problems:  Patient Active Problem List   Diagnosis Date Noted   Limited prenatal care, antepartum 01/01/2021   Marijuana abuse 01/01/2021   History of precipitous delivery 01/01/2021   Category II fetal heart rate tracing during labor and delivery 01/01/2021   Pelvic pressure in pregnancy, antepartum, third trimester 12/28/2020   Gastroesophageal reflux in pregnancy in third trimester 12/14/2020   Normal labor 10/12/2019   Smoking 08/03/2019   Labor and delivery, indication for care 03/08/2018   Supervision of low-risk pregnancy 10/22/2017    Allergies: No Known Allergies Medications:  Current Outpatient Medications:    acetaminophen (TYLENOL) 325 MG tablet, Take 650 mg by mouth every 6 (six) hours  as needed., Disp: , Rfl:    amoxicillin-clavulanate (AUGMENTIN) 875-125 MG tablet, Take 1 tablet by mouth every 12 (twelve) hours., Disp: 14 tablet, Rfl: 0   cetirizine (ZYRTEC ALLERGY) 10 MG tablet, Take 1 tablet (10 mg total) by mouth daily., Disp: 90 tablet, Rfl: 1   famotidine (PEPCID) 20 MG tablet, Take 1 tablet (20 mg total) by mouth 2 (two) times daily., Disp: 60 tablet, Rfl: 1   fluconazole  (DIFLUCAN) 150 MG tablet, Take 150 mg by mouth once., Disp: , Rfl:    guaiFENesin (MUCINEX) 600 MG 12 hr tablet, Take 2 tablets (1,200 mg total) by mouth 2 (two) times daily for 7 days., Disp: 28 tablet, Rfl: 0   hydrOXYzine (ATARAX) 25 MG tablet, Take 1 tablet (25 mg total) by mouth every 6 (six) hours., Disp: 12 tablet, Rfl: 0   ibuprofen (ADVIL) 600 MG tablet, Take 1 tablet (600 mg total) by mouth every 6 (six) hours., Disp: 30 tablet, Rfl: 1   methocarbamol (ROBAXIN) 500 MG tablet, Take 1 tablet (500 mg total) by mouth every 8 (eight) hours as needed., Disp: 8 tablet, Rfl: 0   naproxen (NAPROSYN) 375 MG tablet, Take 1 tablet (375 mg total) by mouth 2 (two) times daily., Disp: 20 tablet, Rfl: 0   oxyCODONE (OXY IR/ROXICODONE) 5 MG immediate release tablet, Take 5 mg by mouth every 4 (four) hours as needed., Disp: , Rfl:    predniSONE (STERAPRED UNI-PAK 21 TAB) 10 MG (21) TBPK tablet, Use as directed, Disp: 21 tablet, Rfl: 0  Observations/Objective: Patient is well-developed, well-nourished in no acute distress.  Resting comfortably  at home.  Head is normocephalic, atraumatic.  No labored breathing.  Speech is clear and coherent with logical content.  Patient is alert and oriented at baseline.    Assessment and Plan:  1. Hives  Scheduled for follow at PCP  Continue Zyrtec, Pepcid and hydroxyzine as well   - predniSONE (DELTASONE) 20 MG tablet; Take 1 tablet (20 mg total) by mouth 2 (two) times daily with a meal for 10 days.  Dispense: 20 tablet; Refill: 0     Follow Up Instructions: I discussed the assessment and treatment plan with the patient. The patient was provided an opportunity to ask questions and all were answered. The patient agreed with the plan and demonstrated an understanding of the instructions.  A copy of instructions were sent to the patient via MyChart unless otherwise noted below.    The patient was advised to call back or seek an in-person evaluation if the  symptoms worsen or if the condition fails to improve as anticipated.    Viviano Simas, FNP
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
## 2023-06-15 NOTE — Progress Notes (Signed)
The patient no-showed for appointment despite this provider sending direct link with no response and waiting for at least 10 minutes from appointment time for patient to join. They will be marked as a NS for this appointment/time.   Maryon Kemnitz M Kenzie Flakes, PA-C    

## 2023-06-18 ENCOUNTER — Ambulatory Visit (INDEPENDENT_AMBULATORY_CARE_PROVIDER_SITE_OTHER): Payer: Medicaid Other

## 2023-06-18 ENCOUNTER — Telehealth (INDEPENDENT_AMBULATORY_CARE_PROVIDER_SITE_OTHER): Payer: Medicaid Other | Admitting: Nurse Practitioner

## 2023-06-18 ENCOUNTER — Ambulatory Visit (INDEPENDENT_AMBULATORY_CARE_PROVIDER_SITE_OTHER): Payer: Medicaid Other | Admitting: Podiatry

## 2023-06-18 ENCOUNTER — Encounter: Payer: Self-pay | Admitting: Podiatry

## 2023-06-18 VITALS — BP 120/78 | HR 98

## 2023-06-18 DIAGNOSIS — M21611 Bunion of right foot: Secondary | ICD-10-CM

## 2023-06-18 DIAGNOSIS — Z299 Encounter for prophylactic measures, unspecified: Secondary | ICD-10-CM | POA: Diagnosis not present

## 2023-06-18 DIAGNOSIS — L509 Urticaria, unspecified: Secondary | ICD-10-CM | POA: Diagnosis not present

## 2023-06-18 DIAGNOSIS — M21612 Bunion of left foot: Secondary | ICD-10-CM | POA: Diagnosis not present

## 2023-06-18 DIAGNOSIS — H6993 Unspecified Eustachian tube disorder, bilateral: Secondary | ICD-10-CM

## 2023-06-18 DIAGNOSIS — J4520 Mild intermittent asthma, uncomplicated: Secondary | ICD-10-CM

## 2023-06-18 MED ORDER — TRIAMCINOLONE ACETONIDE 10 MG/ML IJ SUSP
2.5000 mg | Freq: Once | INTRAMUSCULAR | Status: AC
  Administered 2023-06-18: 2.5 mg via INTRA_ARTICULAR

## 2023-06-18 MED ORDER — TRIAMCINOLONE ACETONIDE 0.1 % EX CREA: 1.0000 | TOPICAL_CREAM | Freq: Every day | CUTANEOUS | 3 refills | Status: DC

## 2023-06-18 MED ORDER — DEXAMETHASONE SODIUM PHOSPHATE 120 MG/30ML IJ SOLN
4.0000 mg | Freq: Once | INTRAMUSCULAR | Status: AC
  Administered 2023-06-18: 4 mg via INTRA_ARTICULAR

## 2023-06-18 MED ORDER — FLUTICASONE PROPIONATE 50 MCG/ACT NA SUSP: 2.0000 | Freq: Every day | NASAL | 6 refills | Status: AC

## 2023-06-18 MED ORDER — PREDNISONE 20 MG PO TABS: 30.0000 mg | ORAL_TABLET | Freq: Two times a day (BID) | ORAL | 0 refills | Status: AC

## 2023-06-18 MED ORDER — ALBUTEROL SULFATE HFA 108 (90 BASE) MCG/ACT IN AERS: 2.0000 | INHALATION_SPRAY | Freq: Four times a day (QID) | RESPIRATORY_TRACT | 0 refills | Status: AC | PRN

## 2023-06-18 MED ORDER — MONTELUKAST SODIUM 10 MG PO TABS: 10.0000 mg | ORAL_TABLET | Freq: Every day | ORAL | 3 refills | Status: AC

## 2023-06-18 NOTE — Progress Notes (Signed)
  Subjective:  Patient ID: Theresa Finley, female    DOB: 08/03/1990,   MRN: [id number]  No chief complaint on file.   32 y.o. female presents for concern of bilateral bunions that have been bothering her for years. Relates she has had them all her life and have been getting more and more painful She has a family history and mother had to have bunion surgery. She has tried wide shoes and padding but continuing to get pain that is affecting her daily life.  . Denies any other pedal complaints. Denies n/v/f/c.   Past Medical History:  Diagnosis Date   Medical history non-contributory    Trichomonas infection 01/30/2018   Neg 12/15/17    Objective:  Physical Exam: Vascular: DP/PT pulses 2/4 bilateral. CFT <3 seconds. Normal hair growth on digits. No edema.  Skin. No lacerations or abrasions bilateral feet.  Musculoskeletal: MMT 5/5 bilateral lower extremities in DF, PF, Inversion and Eversion. Deceased ROM in DF of ankle joint. HAV deformity noted bilateral with tenderness to medial eminence bilateral more so on right. Pain almost out of proportion. Tender to ROM of the first MPJ. No hypermobility noted bilateral.  Neurological: Sensation intact to light touch.   Assessment:   1. Bilateral bunions      Plan:  Patient was evaluated and treated and all questions answered. -Xrays reviewed. HAV deformity noted bilateral. IM 1-2 anlge on right about 11 degrees. On the left about 10 degrees. No degenerative changes noted at first MPJ.  -Discussed HAV and treatment options;conservative and surgical management; risks, benefits, alternatives discussed. All patient's questions answered. -Discussed padding and wide shoe gear.   -Recommend continue with good supportive shoes and inserts.  -Discussed surgical options. Patient has exhausted conservative options and ready to discuss surgery. Discussed [place] bunionectomy and perioperative course in detail .  Discussed concern for back pain she relates  and pain down her legs and bunion surgery may not solve all of her pain. Has follow-up with back specialist later today.  -Informed surgical risk consent was reviewed and read aloud to the patient.  I reviewed the films.  I have discussed my findings with the patient in great detail.  I have discussed all risks including but not limited to infection, stiffness, scarring, limp, disability, deformity, damage to blood vessels and nerves, numbness, poor healing, need for braces, arthritis, chronic pain, amputation, death.  All benefits and realistic expectations discussed in great detail.  I have made no promises as to the outcome.  I have provided realistic expectations.  I have offered the patient a 2nd opinion, which they have declined and assured me they preferred to proceed despite the risks. -Discussed risk given smoking status and would require her to have stopped vaping/ marijuana for four weeks prior to surgery and four weeks after. Patient agrees to this plan.  -Did reiterate risk given back pains and smoking status and patient would like to proceed with surgery.  Patient requesting injection to get her through until then. Injection provided below.  -Tentativley plan for surgery 12/31.  Post-op meds: Keflex, zofran, oxycodone 5/325mg  and ASA BID.   Procedure: Injection Tendon/Ligament Discussed alternatives, risks, complications and verbal consent was obtained.  Location: Bilateral first MPJ . Skin Prep: Alcohol. Injectate: 1cc 0.5% marcaine plain, 1 cc dexamethasone 0.5 cc kenalog  Disposition: Patient tolerated procedure well. Injection site dressed with a band-aid.  Post-injection care was discussed and return precautions discussed.     Louann Sjogren, DPM

## 2023-06-18 NOTE — Progress Notes (Signed)
Virtual Primary Care Telehealth Visit  The Virtual Primary Care Provider was onsite and in person for this visit.    This is an initial visit to discuss the opportunity to become a primary care patient at [facility]  The patient understands that if their medical background is complex, their case will be reviewed with the Medical Director, and if Virtual Primary Care is not the ideal location for their care, our team will help establish the patient with a primary care provider in the area.   If this is determined that this location is not the best option for the patient, in the future if the patient's medical condition changes we can re explore the option of this location serving as their primary care location.  The patient understands that by becoming a primary care patient, this would be the location for their primary care, and if they chose to leave this location and seek primary care services at another location they will not be Hurless to continue their relationship with this clinic.    Location:  Patient: Visit Location Patient: [facility] visit location: [facility]     History of Present Illness: Theresa Finley is a 32 y.o. who identifies as a female who was assigned female at birth, and is being seen today as a new patient with the Virtual Primary Care Group.   Do you have a current Primary Care Provider? No Have you seen a Primary Care Provider in the past? Yes Do you live in [facility]? No Do you live in the [facility]? Yes  Do you have a history of Diabetes No Do you have a history of HTN No Do you have a history of high cholesterol? No Have you been diagnosed with kidney disease or kidney failure in the past? No Have you been diagnosed with an autoimmune disease? No Have you been diagnosed with sleep apnea? No Have you been diagnosed with Asthma or COPD? Yes Do you have a history of tobacco use? Yes Have you ever suffered a stroke or TIA? No Have you ever  had a seizure? No Have you been diagnosed with ADHD? No Have you been diagnosed with anxiety or depression? No  Have you had an organ transplant in the past No  Do you have any prescriptions for controlled medications No  Do you see any specialists currently Yes Seeing podiatry for bunions today   Have you had surgery in the past Yes Elbow (right) twice  Do you take any hormone replacement therapy Yes Currently on Depo gets injections at health department    HPI:  Theresa Finley has had hives occurring for the past 2 months without known cause  Notes that her mother has the same episodes that started in her 69s  Her mother receives weekly allergy injections   The rash will typically start on her hands then spread neck and will spread to he entire face/ eyelids/ears to her toes  Theresa Finley is currently taking Zyrtec 10mg   Hydroxyzine 25mg   Prednisone 20mg  twice daily  And Pepcid 20 mg twice daily   Theresa Finley has been in the ER for this multiple times and had a Virtual appointment earlier this week   Theresa Finley has 5 children  Theresa Finley did have rashes while pregnant  Theresa Finley is currently on Depo for the past 2 years   Theresa Finley has not had allergy testing  No changes to diet or other daily medication  Theresa Finley eats a normal diet  No restrictions   Denies any  new hair products lotion or makeup   Theresa Finley denies travel outside of the country   Theresa Finley did have labs performed recently at the health department where Theresa Finley seeks reproductive care    Problems:  Patient Active Problem List   Diagnosis Date Noted   Limited prenatal care, antepartum 01/01/2021   Marijuana abuse 01/01/2021   History of precipitous delivery 01/01/2021   Category II fetal heart rate tracing during labor and delivery 01/01/2021   Pelvic pressure in pregnancy, antepartum, third trimester 12/28/2020   Gastroesophageal reflux in pregnancy in third trimester 12/14/2020   Normal labor 10/12/2019   Smoking 08/03/2019   Labor and delivery, indication for  care 03/08/2018   Supervision of low-risk pregnancy 10/22/2017    Allergies: No Known Allergies Medications:  Current Outpatient Medications:    albuterol (VENTOLIN HFA) 108 (90 Base) MCG/ACT inhaler, Inhale 2 puffs into the lungs every 6 (six) hours as needed for wheezing or shortness of breath., Disp: 8 g, Rfl: 0   fluticasone (FLONASE) 50 MCG/ACT nasal spray, Place 2 sprays into both nostrils daily., Disp: 16 g, Rfl: 6   montelukast (SINGULAIR) 10 MG tablet, Take 1 tablet (10 mg total) by mouth at bedtime., Disp: 30 tablet, Rfl: 3   predniSONE (DELTASONE) 20 MG tablet, Take 1.5 tablets (30 mg total) by mouth 2 (two) times daily with a meal for 5 days., Disp: 15 tablet, Rfl: 0   triamcinolone cream (KENALOG) 0.1 %, Apply 1 Application topically daily. Stop use after 14 days, Disp: 30 g, Rfl: 3   acetaminophen (TYLENOL) 325 MG tablet, Take 650 mg by mouth every 6 (six) hours as needed., Disp: , Rfl:    amoxicillin-clavulanate (AUGMENTIN) 875-125 MG tablet, Take 1 tablet by mouth every 12 (twelve) hours., Disp: 14 tablet, Rfl: 0   cetirizine (ZYRTEC ALLERGY) 10 MG tablet, Take 1 tablet (10 mg total) by mouth daily., Disp: 90 tablet, Rfl: 1   famotidine (PEPCID) 20 MG tablet, Take 1 tablet (20 mg total) by mouth 2 (two) times daily., Disp: 60 tablet, Rfl: 1   fluconazole (DIFLUCAN) 150 MG tablet, Take 150 mg by mouth once., Disp: , Rfl:    hydrOXYzine (ATARAX) 25 MG tablet, Take 1 tablet (25 mg total) by mouth every 6 (six) hours., Disp: 12 tablet, Rfl: 0   methocarbamol (ROBAXIN) 500 MG tablet, Take 1 tablet (500 mg total) by mouth every 8 (eight) hours as needed., Disp: 8 tablet, Rfl: 0   naproxen (NAPROSYN) 375 MG tablet, Take 1 tablet (375 mg total) by mouth 2 (two) times daily., Disp: 20 tablet, Rfl: 0   Observations/Objective:  Bilateral ears with fluid post TM canals WNL no erythema or inflammation    Today's Vitals   06/18/23 1253  BP: 120/78  Pulse: 98   There is no height or  weight on file to calculate BMI.   Assessment and Plan:   1. Mild intermittent asthma without complication - albuterol (VENTOLIN HFA) 108 (90 Base) MCG/ACT inhaler; Inhale 2 puffs into the lungs every 6 (six) hours as needed for wheezing or shortness of breath.  Dispense: 8 g; Refill: 0 - Ambulatory referral to Allergy - montelukast (SINGULAIR) 10 MG tablet; Take 1 tablet (10 mg total) by mouth at bedtime.  Dispense: 30 tablet; Refill: 3 - AMB Referral [facility] Care Management  2. Hives - Ambulatory referral to Allergy - montelukast (SINGULAIR) 10 MG tablet; Take 1 tablet (10 mg total) by mouth at bedtime.  Dispense: 30 tablet; Refill: 3 - predniSONE (  DELTASONE) 20 MG tablet; Take 1.5 tablets (30 mg total) by mouth 2 (two) times daily with a meal for 5 days.  Dispense: 15 tablet; Refill: 0 - triamcinolone cream (KENALOG) 0.1 %; Apply 1 Application topically daily. Stop use after 14 days  Dispense: 30 g; Refill: 3 - AMB Referral [facility] Care Management  3. Dysfunction of both eustachian tubes - fluticasone (FLONASE) 50 MCG/ACT nasal spray; Place 2 sprays into both nostrils daily.  Dispense: 16 g; Refill: 6 - AMB Referral [facility] Care Management  4. Preventive medication therapy needed - AMB Referral [facility] Care Management   Need assistance with medication costs   Follow Up Instructions: I discussed the assessment and treatment plan with the patient.   The patient was advised to call back or seek an in-person evaluation if the symptoms worsen or if the condition fails to improve as anticipated.    Viviano Simas, FNP  **Disclaimer: This note may have been dictated with voice recognition software. Similar sounding words can inadvertently be transcribed and this note may contain transcription errors which may not have been corrected upon publication of note.**

## 2023-06-18 NOTE — Progress Notes (Signed)
The patient attended Virtual primary appt on 06/18/23. At the appointment the patient noted she had food, housing, and transportation SDOH insecurities. Patient was given SDOH Resources at the appointment. Pt stated she needs help paying for her [employer] bill and rental assistance. Pt gave consent for a [email or web address] referral. CG will did a [employer] referral for rental and utility assistance.  A sixty day follow up will be done to see if the pt still has sdoh insecurities.

## 2023-06-19 ENCOUNTER — Ambulatory Visit: Payer: Medicaid Other | Admitting: Orthopedic Surgery

## 2023-06-19 ENCOUNTER — Encounter: Payer: Self-pay | Admitting: Orthopedic Surgery

## 2023-06-19 ENCOUNTER — Other Ambulatory Visit (INDEPENDENT_AMBULATORY_CARE_PROVIDER_SITE_OTHER): Payer: Medicaid Other

## 2023-06-19 DIAGNOSIS — M25561 Pain in right knee: Secondary | ICD-10-CM

## 2023-06-19 DIAGNOSIS — G8929 Other chronic pain: Secondary | ICD-10-CM
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
## 2023-06-26 ENCOUNTER — Telehealth: Payer: Self-pay

## 2023-06-26 NOTE — Progress Notes (Signed)
   Care Guide Note  06/26/2023 Name: Theresa Finley MRN: [id number] DOB: 1991-04-24  Referred by: Department, [facility] Reason for referral : Care Coordination (Outreach to schedule with Pharm d )   Theresa Finley is a 32 y.o. year old female who is a primary care patient of Department, [facility]. Theresa Finley was referred to the pharmacist for assistance related to  med assistance  .    An unsuccessful telephone outreach was attempted today to contact the patient who was referred to the pharmacy team for assistance with medication assistance. Additional attempts will be made to contact the patient.   Penne Lash , RMA     [facility]  [facility], [facility] Guide  Direct Dial: [phone]  Website: [email or web address]

## 2023-06-28 ENCOUNTER — Telehealth: Payer: Self-pay | Admitting: Pharmacist

## 2023-06-28 NOTE — Progress Notes (Signed)
   06/28/2023  Patient ID: Theresa Finley, female   DOB: [birth year], 32 y.o.   MRN: [id number]  Received referral request for medication cost concerns from Dr. Jimmey Ralph. Called the patient today and she reports she can afford all of her medications except the Prednisone injections. Advised we have not received or can see any mention of steroid injections listed. She said they we to be sent to the [facility] or pharmacy, debating on the cost.   Asked which doctor it was supposed to come from, but could not list a name. Discussed that there is a MyChart message from 11/25 to schedule with Allergy & Asthma office- advised to read message and call them back to schedule. Also advised she call whichever office the injections were to come from regarding sending a prescription. Patient hung up the phone.   Closing referral.   Marlowe Aschoff, PharmD [facility] Phone Number: [phone]

## 2023-06-29 ENCOUNTER — Telehealth: Payer: Self-pay | Admitting: Podiatry

## 2023-06-29 NOTE — Telephone Encounter (Signed)
DOS- 07/24/23  Theresa Finley [id number]  [employer] EFFECTIVE DATE-01/22/23  DEDUCTIBLE- $0.00 WITH REMAINING $0.00 OOP- Member's individual out-of-pocket maximum has no limit. COINSURANCE- 0%  PER THE UHC WEBSITE PORTAL, PRIOR AUTH HAS BEEN APPROVED FOR CPT CODE 45409. GOOD FROM 07/24/23 - 09/21/23.  AUTH REFERENCE NUMBER: [id number]

## 2023-06-30 ENCOUNTER — Encounter: Payer: Medicaid Other | Admitting: Nurse Practitioner

## 2023-06-30 DIAGNOSIS — L509 Urticaria, unspecified: Secondary | ICD-10-CM

## 2023-06-30 DIAGNOSIS — L508 Other urticaria: Secondary | ICD-10-CM

## 2023-06-30 NOTE — Progress Notes (Signed)
Theresa Finley disconnected video prior to completion. She was requesting additional steroid prescription. I have instructed her that she will need to reach out to her PCP for additional prednisone prescriptions as she has chronic urticaria or referral to dermatologist/allergist.

## 2023-06-30 NOTE — Progress Notes (Signed)
I have spent 5 minutes in review of e-visit questionnaire, review and updating patient chart, medical decision making and response to patient.  ° °Jerrell Mangel W Secilia Apps, NP ° °  °

## 2023-06-30 NOTE — Progress Notes (Signed)
Because you have a history of chronic intermittent hives/urticaria and will more than likely require chronic treatment such as IM steroids,  I feel your condition warrants further evaluation and I recommend that you be seen for a face to face visit.    Please contact your primary care physician practice to be seen. Many offices offer virtual options to be seen via video if you are not comfortable going in person to a medical facility at this time.  NOTE: You will NOT be charged for this eVisit.  If you do not have a PCP, [facility] offers a free physician referral service available at [phone]. Our trained staff has the experience, knowledge and resources to put you in touch with a physician who is right for you.    If you are having a true medical emergency please call 911.   Your e-visit answers were reviewed by a board certified advanced clinical practitioner to complete your personal care plan.  Thank you for using e-Visits.

## 2023-07-02 ENCOUNTER — Encounter: Payer: Self-pay | Admitting: Nurse Practitioner

## 2023-07-02 ENCOUNTER — Telehealth: Payer: Medicaid Other | Admitting: Nurse Practitioner

## 2023-07-02 VITALS — BP 105/69 | HR 105 | Resp 16 | Wt 115.0 lb

## 2023-07-02 DIAGNOSIS — L509 Urticaria, unspecified: Secondary | ICD-10-CM

## 2023-07-02 MED ORDER — TRIAMCINOLONE ACETONIDE 0.1 % EX CREA: 1.0000 | TOPICAL_CREAM | Freq: Every day | CUTANEOUS | 3 refills | Status: AC

## 2023-07-02 MED ORDER — PREDNISONE 20 MG PO TABS: 20.0000 mg | ORAL_TABLET | Freq: Two times a day (BID) | ORAL | 0 refills | Status: AC | PRN

## 2023-07-02 NOTE — Progress Notes (Signed)
Pt presents for urticaria  -states that it only happens at night

## 2023-07-02 NOTE — Progress Notes (Signed)
Established Patient Office Visit  Virtual Visit Consent:   Theresa Finley, you are scheduled for a virtual visit with a [facility] provider today.     Just as with appointments in the office, your consent must be obtained to participate.  Your consent will be active for this visit and any virtual visit you may have with one of our providers in the next 365 days.     If you have a MyChart account, a copy of this consent can be sent to you electronically.  All virtual visits are billed to your insurance company just like a traditional visit in the office.    As this is a virtual visit, video technology does not allow for your provider to perform a traditional examination.  This may limit your provider's ability to fully assess your condition.  If your provider identifies any concerns that need to be evaluated in person or the need to arrange testing (such as labs, EKG, etc.), we will make arrangements to do so.     Although advances in technology are sophisticated, we cannot ensure that it will always work on either your end or our end.  If the connection with a video visit is poor, the visit may have to be switched to a telephone visit.  With either a video or telephone visit, we are not always [place] to ensure that we have a secure connection.     I need to obtain your verbal consent now.   Are you willing to proceed with your visit today?    Theresa Finley has provided verbal consent on 07/02/2023 for a virtual visit (video or telephone).   Viviano Simas, FNP  Date: 07/02/2023 12:25 PM  SUBJECTIVE  Patient ID: Theresa Finley, female    DOB: Jun 30, 1991  Age: 32 y.o. MRN: [id number]  Theresa Finley, connected with  Theresa Finley  ([id number], September 11, 1990) on 07/02/23 at 12:00 PM EST by a video-enabled telemedicine application and verified that I am speaking with the correct person using two identifiers.  Telepresenter, Margorie John, present for entirety of visit to assist with video  functionality and physical examination via [employer] device.   Location: Patient: [facility] Provider: Virtual Visit Location Provider: Home   I discussed the limitations of evaluation and management by telemedicine and the availability of in person appointments. The patient expressed understanding and agreed to proceed.      Chief Complaint  Patient presents with   Rash    HPI  Theresa Finley is a 32 y.o. who identifies as a female who was assigned female at birth, and is being seen today for an intermittent recurrent rash.  She was refilled for prednisone at last visit due to recurring hives States today that she feels it may happen when she is stressed   She has now been off prednisone for 4 days  Since that time she has been experiencing what she describes as hives at night  This causes her to be unable to sleep  Today in the office she does not have a rash   She continues Zyrtec/Pepcid and hydroxyzine daily    Feels that when she is not on prednisone the hives return And would like to try using it as needed at times when she does have a rash since even with her daily maintenance medications she continues to have recurrence of her rash   Typically on torso and neck   Was referred to allergy and asthma at  last visit, has not been [place] to schedule that as of now      Review of Systems  Constitutional: Negative.   Skin:  Positive for rash.      Objective:     BP 105/69 (BP Location: Left Arm, Patient Position: Sitting, Cuff Size: Normal)   Pulse (!) 105   Resp 16   Wt 115 lb (52.2 kg)   LMP 05/28/2023 (Exact Date)   SpO2 100%   BMI 19.74 kg/m    Physical Exam Constitutional:      Appearance: Normal appearance.  HENT:     Head: Normocephalic.     Nose: Nose normal.  Pulmonary:     Effort: Pulmonary effort is normal.  Neurological:     General: No focal deficit present.     Mental Status: She is alert.  Psychiatric:        Mood and Affect: Mood  normal.        Assessment & Plan:   1. Hives Possibly triggers include unidentified irritant, stress, heat/cold exposure   Discussed long term use of prednisone and encouraged as needed use only Patient will call allergy office after leaving today to get scheduled for consultation regarding recurrent rashes    - predniSONE (DELTASONE) 20 MG tablet; Take 1 tablet (20 mg total) by mouth 2 (two) times daily as needed.  Dispense: 60 tablet; Refill: 0 - triamcinolone cream (KENALOG) 0.1 %; Apply 1 Application topically daily. Stop use after 14 consecutive days (use only as needed for rash)  Dispense: 30 g; Refill: 3     She can return to the office as needed   Follow Up Instructions:  I discussed the assessment and treatment plan with the patient. The patient was provided an opportunity to ask questions and all were answered. The patient agreed with the plan and demonstrated an understanding of the instructions.  A copy of instructions were sent to the patient via MyChart unless otherwise noted below.    The patient was advised to call back or seek an in-person evaluation if the symptoms worsen or if the condition fails to improve as anticipated.   Viviano Simas, FNP  **Disclaimer: This note may have been dictated with voice recognition software. Similar sounding words can inadvertently be transcribed and this note may contain transcription errors which may not have been corrected upon publication of note.**

## 2023-07-09 ENCOUNTER — Telehealth: Payer: Self-pay | Admitting: Orthopedic Surgery

## 2023-07-09 NOTE — Telephone Encounter (Signed)
Attempted to call pt to set an appt for MRI with Dr Lajoyce Corners 1 wk after MRI appt on 12/28. Pt has no voicemail. If call please set MRI Review Lajoyce Corners next available  appt.

## 2023-07-21 ENCOUNTER — Other Ambulatory Visit: Payer: Medicaid Other

## 2023-07-31 ENCOUNTER — Other Ambulatory Visit: Payer: Self-pay | Admitting: Nurse Practitioner

## 2023-07-31 ENCOUNTER — Encounter: Payer: Medicaid Other | Admitting: Podiatry

## 2023-07-31 DIAGNOSIS — L509 Urticaria, unspecified: Secondary | ICD-10-CM

## 2023-08-14 ENCOUNTER — Encounter: Payer: Medicaid Other | Admitting: Podiatry

## 2023-12-12 ENCOUNTER — Encounter (HOSPITAL_COMMUNITY): Payer: Self-pay

## 2023-12-12 ENCOUNTER — Emergency Department (HOSPITAL_COMMUNITY)
Admission: EM | Admit: 2023-12-12 | Discharge: 2023-12-12 | Disposition: A | Discharge status: Home / Self Care | Attending: Emergency Medicine | Admitting: Emergency Medicine

## 2023-12-12 ENCOUNTER — Emergency Department (HOSPITAL_COMMUNITY)

## 2023-12-12 ENCOUNTER — Other Ambulatory Visit: Payer: Self-pay

## 2023-12-12 DIAGNOSIS — S62655A Nondisplaced fracture of medial phalanx of left ring finger, initial encounter for closed fracture: Secondary | ICD-10-CM | POA: Diagnosis not present

## 2023-12-12 DIAGNOSIS — M549 Dorsalgia, unspecified: Secondary | ICD-10-CM | POA: Diagnosis not present

## 2023-12-12 DIAGNOSIS — S0083XA Contusion of other part of head, initial encounter: Secondary | ICD-10-CM | POA: Insufficient documentation

## 2023-12-12 DIAGNOSIS — S6992XA Unspecified injury of left wrist, hand and finger(s), initial encounter: Secondary | ICD-10-CM | POA: Diagnosis present

## 2023-12-12 LAB — HCG, SERUM, QUALITATIVE: Preg, Serum: NEGATIVE

## 2023-12-12 MED ORDER — ACETAMINOPHEN 500 MG PO TABS
1000.0000 mg | ORAL_TABLET | Freq: Once | ORAL | Status: AC
  Administered 2023-12-12: 1000 mg via ORAL
  Filled 2023-12-12: qty 2

## 2023-12-12 MED ORDER — METHOCARBAMOL 500 MG PO TABS
500.0000 mg | ORAL_TABLET | Freq: Once | ORAL | Status: AC
  Administered 2023-12-12: 500 mg via ORAL
  Filled 2023-12-12: qty 1

## 2023-12-12 MED ORDER — IBUPROFEN 800 MG PO TABS
800.0000 mg | ORAL_TABLET | Freq: Once | ORAL | Status: AC
  Administered 2023-12-12: 800 mg via ORAL
  Filled 2023-12-12: qty 1

## 2023-12-12 NOTE — ED Notes (Signed)
 Patient is upset about not getting any more pain medications. I explained to her that she was at the max limit for tylenol  or ibuprofen  right now and would not be [place] to get anymore. I asked for another nurse to help assist with DC.
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
## 2023-12-12 NOTE — Discharge Instructions (Signed)
 Keep your splint clean and dry. You may take Tylenol  and/or Motrin  as needed for pain. Please call the orthopedist listed to schedule a follow up appointment.
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
## 2023-12-20 ENCOUNTER — Other Ambulatory Visit: Payer: Self-pay | Admitting: Orthopedic Surgery
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
## 2023-12-21 ENCOUNTER — Encounter (HOSPITAL_BASED_OUTPATIENT_CLINIC_OR_DEPARTMENT_OTHER)
Admission: RE | Disposition: A | Discharge status: Home / Self Care | Payer: Self-pay | Source: Home / Self Care | Attending: Orthopedic Surgery

## 2023-12-21 ENCOUNTER — Other Ambulatory Visit: Payer: Self-pay

## 2023-12-21 ENCOUNTER — Ambulatory Visit (HOSPITAL_BASED_OUTPATIENT_CLINIC_OR_DEPARTMENT_OTHER)
Admission: RE | Admit: 2023-12-21 | Discharge: 2023-12-21 | Disposition: A | Discharge status: Home / Self Care | Attending: Orthopedic Surgery | Admitting: Orthopedic Surgery

## 2023-12-21 ENCOUNTER — Ambulatory Visit (HOSPITAL_BASED_OUTPATIENT_CLINIC_OR_DEPARTMENT_OTHER): Admitting: Anesthesiology

## 2023-12-21 ENCOUNTER — Ambulatory Visit (HOSPITAL_COMMUNITY)

## 2023-12-21 ENCOUNTER — Encounter (HOSPITAL_BASED_OUTPATIENT_CLINIC_OR_DEPARTMENT_OTHER): Payer: Self-pay | Admitting: Orthopedic Surgery

## 2023-12-21 DIAGNOSIS — S62625A Displaced fracture of medial phalanx of left ring finger, initial encounter for closed fracture: Secondary | ICD-10-CM

## 2023-12-21 HISTORY — PX: CLOSED REDUCTION FINGER WITH PERCUTANEOUS PINNING: SHX5612

## 2023-12-21 LAB — POCT PREGNANCY, URINE: Preg Test, Ur: NEGATIVE

## 2023-12-21 SURGERY — CLOSED REDUCTION, FINGER, WITH PERCUTANEOUS PINNING
Anesthesia: Monitor Anesthesia Care | Site: Finger | Laterality: Left

## 2023-12-21 MED ORDER — ACETAMINOPHEN 325 MG PO TABS: 650.0000 mg | ORAL_TABLET | Freq: Four times a day (QID) | ORAL | Status: AC

## 2023-12-21 MED ORDER — OXYCODONE HCL 5 MG PO TABS
ORAL_TABLET | ORAL | Status: AC
  Filled 2023-12-21: qty 1

## 2023-12-21 MED ORDER — LIDOCAINE HCL (PF) 1 % IJ SOLN
INTRAMUSCULAR | Status: DC | PRN
  Administered 2023-12-21: 9 mL

## 2023-12-21 MED ORDER — PROPOFOL 10 MG/ML IV BOLUS
INTRAVENOUS | Status: DC | PRN
  Administered 2023-12-21: 20 mg via INTRAVENOUS
  Administered 2023-12-21: 100 ug/kg/min via INTRAVENOUS

## 2023-12-21 MED ORDER — LIDOCAINE HCL (PF) 1 % IJ SOLN
INTRAMUSCULAR | Status: AC
  Filled 2023-12-21: qty 30

## 2023-12-21 MED ORDER — CEFAZOLIN SODIUM-DEXTROSE 2-4 GM/100ML-% IV SOLN
2.0000 g | INTRAVENOUS | Status: AC
  Administered 2023-12-21: 2 g via INTRAVENOUS

## 2023-12-21 MED ORDER — ONDANSETRON HCL 4 MG/2ML IJ SOLN: 4.0000 mg | Freq: Once | INTRAMUSCULAR | Status: DC | PRN

## 2023-12-21 MED ORDER — FENTANYL CITRATE (PF) 100 MCG/2ML IJ SOLN
INTRAMUSCULAR | Status: AC
  Filled 2023-12-21: qty 2

## 2023-12-21 MED ORDER — FENTANYL CITRATE (PF) 100 MCG/2ML IJ SOLN
INTRAMUSCULAR | Status: DC | PRN
  Administered 2023-12-21: 100 ug via INTRAVENOUS

## 2023-12-21 MED ORDER — LACTATED RINGERS IV SOLN: INTRAVENOUS | Status: DC

## 2023-12-21 MED ORDER — IBUPROFEN 200 MG PO TABS: 600.0000 mg | ORAL_TABLET | Freq: Four times a day (QID) | ORAL | Status: AC

## 2023-12-21 MED ORDER — CEFAZOLIN SODIUM-DEXTROSE 2-4 GM/100ML-% IV SOLN
INTRAVENOUS | Status: AC
  Filled 2023-12-21: qty 100

## 2023-12-21 MED ORDER — ACETAMINOPHEN 500 MG PO TABS
1000.0000 mg | ORAL_TABLET | Freq: Four times a day (QID) | ORAL | Status: DC | PRN
  Administered 2023-12-21: 1000 mg via ORAL

## 2023-12-21 MED ORDER — SODIUM BICARBONATE 4.2 % IV SOLN
INTRAVENOUS | Status: DC | PRN
  Administered 2023-12-21: 1 mL via INTRAVENOUS

## 2023-12-21 MED ORDER — LIDOCAINE 2% (20 MG/ML) 5 ML SYRINGE
INTRAMUSCULAR | Status: AC
  Filled 2023-12-21: qty 5

## 2023-12-21 MED ORDER — MIDAZOLAM HCL 5 MG/5ML IJ SOLN
INTRAMUSCULAR | Status: DC | PRN
  Administered 2023-12-21: 2 mg via INTRAVENOUS

## 2023-12-21 MED ORDER — FENTANYL CITRATE (PF) 100 MCG/2ML IJ SOLN: 25.0000 ug | INTRAMUSCULAR | Status: DC | PRN

## 2023-12-21 MED ORDER — OXYCODONE HCL 5 MG PO TABS
5.0000 mg | ORAL_TABLET | Freq: Once | ORAL | Status: AC | PRN
  Administered 2023-12-21: 5 mg via ORAL

## 2023-12-21 MED ORDER — MIDAZOLAM HCL 2 MG/2ML IJ SOLN
INTRAMUSCULAR | Status: AC
Start: 2023-12-21 — End: ?
  Filled 2023-12-21: qty 2

## 2023-12-21 MED ORDER — ACETAMINOPHEN 10 MG/ML IV SOLN: 1000.0000 mg | Freq: Once | INTRAVENOUS | Status: DC | PRN

## 2023-12-21 MED ORDER — ACETAMINOPHEN 500 MG PO TABS
ORAL_TABLET | ORAL | Status: AC
  Filled 2023-12-21: qty 2

## 2023-12-21 MED ORDER — OXYCODONE HCL 5 MG/5ML PO SOLN: 5.0000 mg | Freq: Once | ORAL | Status: AC | PRN

## 2023-12-21 MED ORDER — PROPOFOL 500 MG/50ML IV EMUL
INTRAVENOUS | Status: AC
  Filled 2023-12-21: qty 50

## 2023-12-21 SURGICAL SUPPLY — 47 items
BAND RUBBER #18 3X1/16 STRL (MISCELLANEOUS) IMPLANT
BLADE MINI RND TIP GREEN BEAV (BLADE) IMPLANT
BLADE SURG 15 STRL LF DISP TIS (BLADE) ×2 IMPLANT
BNDG COHESIVE 1X5 TAN STRL LF (GAUZE/BANDAGES/DRESSINGS) IMPLANT
BNDG COHESIVE 2X5 TAN ST LF (GAUZE/BANDAGES/DRESSINGS) IMPLANT
BNDG COHESIVE 4X5 TAN STRL LF (GAUZE/BANDAGES/DRESSINGS) IMPLANT
BNDG ESMARK 4X9 LF (GAUZE/BANDAGES/DRESSINGS) IMPLANT
BNDG GAUZE DERMACEA FLUFF 4 (GAUZE/BANDAGES/DRESSINGS) ×2 IMPLANT
CHLORAPREP W/TINT 26 (MISCELLANEOUS) ×2 IMPLANT
CORD BIPOLAR FORCEPS 12FT (ELECTRODE) IMPLANT
COVER BACK TABLE 60X90IN (DRAPES) ×2 IMPLANT
COVER MAYO STAND STRL (DRAPES) ×2 IMPLANT
CUFF TOURN SGL QUICK 18X4 (TOURNIQUET CUFF) IMPLANT
DEPRESSOR TONGUE 6 IN STERILE (GAUZE/BANDAGES/DRESSINGS) IMPLANT
DRAPE C-ARM 42X72 X-RAY (DRAPES) ×2 IMPLANT
DRAPE EXTREMITY T 121X128X90 (DISPOSABLE) ×2 IMPLANT
DRAPE SURG 17X23 STRL (DRAPES) ×2 IMPLANT
DRSG EMULSION OIL 3X3 NADH (GAUZE/BANDAGES/DRESSINGS) IMPLANT
GAUZE SPONGE 4X4 12PLY STRL LF (GAUZE/BANDAGES/DRESSINGS) ×2 IMPLANT
GAUZE STRETCH 2X75IN STRL (MISCELLANEOUS) IMPLANT
GAUZE XEROFORM 1X8 LF (GAUZE/BANDAGES/DRESSINGS) IMPLANT
GLOVE BIO SURGEON STRL SZ7.5 (GLOVE) ×2 IMPLANT
GLOVE BIOGEL PI IND STRL 7.0 (GLOVE) ×2 IMPLANT
GLOVE BIOGEL PI IND STRL 8 (GLOVE) ×2 IMPLANT
GLOVE ECLIPSE 6.5 STRL STRAW (GLOVE) ×2 IMPLANT
GOWN STRL REUS W/ TWL LRG LVL3 (GOWN DISPOSABLE) ×4 IMPLANT
GOWN STRL REUS W/TWL XL LVL3 (GOWN DISPOSABLE) ×2 IMPLANT
KWIRE .045X6 DBL TRO NS (WIRE) IMPLANT
NDL HYPO 22X1.5 SAFETY MO (MISCELLANEOUS) IMPLANT
NDL HYPO 25X1 1.5 SAFETY (NEEDLE) IMPLANT
NEEDLE HYPO 22X1.5 SAFETY MO (MISCELLANEOUS) IMPLANT
NEEDLE HYPO 25X1 1.5 SAFETY (NEEDLE) ×1 IMPLANT
NS IRRIG 1000ML POUR BTL (IV SOLUTION) ×2 IMPLANT
PACK BASIN DAY SURGERY FS (CUSTOM PROCEDURE TRAY) ×2 IMPLANT
PADDING CAST ABS COTTON 4X4 ST (CAST SUPPLIES) IMPLANT
PADDING UNDERCAST 2X4 STRL (CAST SUPPLIES) IMPLANT
STOCKINETTE 6 STRL (DRAPES) ×2 IMPLANT
STRIP CLOSURE SKIN 1/2X4 (GAUZE/BANDAGES/DRESSINGS) IMPLANT
SUT CHROMIC 6 0 PS 4 (SUTURE) ×2 IMPLANT
SUT ETHILON 4 0 PS 2 18 (SUTURE) IMPLANT
SUT VICRYL RAPIDE 4-0 (SUTURE) IMPLANT
SUT VICRYL RAPIDE 4/0 PS 2 (SUTURE) IMPLANT
SUT VICRYL+ 3-0 27IN RB-1 (SUTURE) IMPLANT
SYR 10ML LL (SYRINGE) IMPLANT
SYR BULB EAR ULCER 3OZ GRN STR (SYRINGE) IMPLANT
TOWEL GREEN STERILE FF (TOWEL DISPOSABLE) ×2 IMPLANT
UNDERPAD 30X36 HEAVY ABSORB (UNDERPADS AND DIAPERS) ×2 IMPLANT

## 2023-12-21 NOTE — Anesthesia Preprocedure Evaluation (Addendum)
 Anesthesia Evaluation  Patient identified by MRN, date of birth, ID band Patient awake    Reviewed: Allergy & Precautions, NPO status , Patient's Chart, lab work & pertinent test results, reviewed documented beta blocker date and time   History of Anesthesia Complications Negative for: history of anesthetic complications  Airway Mallampati: III  TM Distance: >3 FB   Mouth opening: Limited Mouth Opening  Dental no notable dental hx.    Pulmonary neg COPD, Current Smoker (vapes)Patient did not abstain from smoking., former smoker, neg PE   breath sounds clear to auscultation       Cardiovascular (-) CAD, (-) Past MI and (-) Cardiac Stents  Rhythm:Regular Rate:Normal     Neuro/Psych neg Seizures PSYCHIATRIC DISORDERS         GI/Hepatic ,neg GERD  ,,(+) neg Cirrhosis        Endo/Other    Renal/GU Renal disease     Musculoskeletal   Abdominal   Peds  Hematology   Anesthesia Other Findings   Reproductive/Obstetrics                             Anesthesia Physical Anesthesia Plan  ASA: 2  Anesthesia Plan: MAC   Post-op Pain Management:    Induction: Intravenous  PONV Risk Score and Plan: 2 and Ondansetron   Airway Management Planned: Natural Airway and Nasal Cannula  Additional Equipment:   Intra-op Plan:   Post-operative Plan: Extubation in OR  Informed Consent: I have reviewed the patients History and Physical, chart, labs and discussed the procedure including the risks, benefits and alternatives for the proposed anesthesia with the patient or authorized representative who has indicated his/her understanding and acceptance.     Dental advisory given  Plan Discussed with: CRNA  Anesthesia Plan Comments: (Large amount of facial piercings which she reports can not be removed. )       Anesthesia Quick Evaluation

## 2023-12-21 NOTE — Anesthesia Postprocedure Evaluation (Signed)
 Anesthesia Post Note  Patient: Jenelle L Watkinson  Procedure(s) Performed: CLOSED REDUCTION WITH PERCUTANEOUS PINNING LEFT RING FINGER MIDDLE PHALANX FRACTURE (Left: Finger)     Patient location during evaluation: PACU Anesthesia Type: MAC Level of consciousness: awake and alert Pain management: pain level controlled Vital Signs Assessment: post-procedure vital signs reviewed and stable Respiratory status: spontaneous breathing, nonlabored ventilation, respiratory function stable and patient connected to nasal cannula oxygen Cardiovascular status: stable and blood pressure returned to baseline Postop Assessment: no apparent nausea or vomiting Anesthetic complications: no   No notable events documented.  Last Vitals:  Vitals:   12/21/23 1241 12/21/23 1300  BP: 110/80 95/81  Pulse: 86 83  Resp: (!) 21 18  Temp:  (!) 36.2 C  SpO2: 98% 98%    Last Pain:  Vitals:   12/21/23 1324  TempSrc:   PainSc: 8                  Leslye Rast

## 2023-12-21 NOTE — Interval H&P Note (Signed)
 History and Physical Interval Note:  12/21/2023 10:37 AM  Theresa Finley  has presented today for surgery, with the diagnosis of DISPLACED LEFT RING FINGER FRACTURE.  The various methods of treatment have been discussed with the patient and family. After consideration of risks, benefits and other options for treatment, the patient has consented to  Procedure(s): CLOSED REDUCTION, FINGER, WITH PERCUTANEOUS PINNING (Left) OPEN REDUCTION INTERNAL FIXATION (ORIF) DISTAL PHALANX (Left) as a surgical intervention.  The patient's history has been reviewed, patient examined, no change in status, stable for surgery.  I have reviewed the patient's chart and labs.  Questions were answered to the patient's satisfaction.     Sheryl Donna

## 2023-12-21 NOTE — Transfer of Care (Signed)
 Immediate Anesthesia Transfer of Care Note  Patient: Theresa Finley  Procedure(s) Performed: CLOSED REDUCTION WITH PERCUTANEOUS PINNING LEFT RING AND MIDDLE FINGER (Left: Finger)  Patient Location: PACU  Anesthesia Type:MAC  Level of Consciousness: drowsy  Airway & Oxygen Therapy: Patient Spontanous Breathing and Patient connected to face mask oxygen  Post-op Assessment: Report given to RN and Post -op Vital signs reviewed and stable  Post vital signs: Reviewed and stable  Last Vitals:  Vitals Value Taken Time  BP 93/57 12/21/23 1220  Temp    Pulse 68 12/21/23 1221  Resp 13 12/21/23 1221  SpO2 100 % 12/21/23 1221  Vitals shown include unfiled device data.  Last Pain:  Vitals:   12/21/23 0901  TempSrc: Temporal  PainSc: 0-No pain         Complications: No notable events documented.
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
## 2023-12-21 NOTE — Discharge Instructions (Addendum)
 Discharge Instructions   You have a dressing with a plaster splint incorporated in it. Move your fingers as much as possible, making a full fist and fully opening the fist. Elevate your hand to reduce pain & swelling of the digits.  Ice over the operative site may be helpful to reduce pain & swelling.  DO NOT USE HEAT. Take Tylenol  650 mg and Ibuprofen  600 mg every 6 hours together for pain. Take the prescribed pain medicine additionally as a rescue medicine for severe post operative pain. Leave the dressing in place until you return to our office.  You may shower, but keep the bandage clean & dry.  You may drive a car when you are off of prescription pain medications and can safely control your vehicle with both hands. Our office will call you to arrange follow-up   Please call [phone] during normal business hours or [phone] after hours for any problems. Including the following:  - excessive redness of the incisions - drainage for more than 4 days - fever of more than 101.5 F  *Please note that pain medications will not be refilled after hours or on weekends.   Work Status: No lifting, gripping, or grasping greater than paper and pencil tasks with the left hand until first post operative appointment.  Start taking tylenol  and ibuprofen  on a scheduled basis when you get home.    Post Anesthesia Home Care Instructions  Activity: Get plenty of rest for the remainder of the day. A responsible individual must stay with you for 24 hours following the procedure.  For the next 24 hours, DO NOT: -Drive a car -Advertising copywriter -Drink alcoholic beverages -Take any medication unless instructed by your physician -Make any legal decisions or sign important papers.  Meals: Start with liquid foods such as gelatin or soup. Progress to regular foods as tolerated. Avoid greasy, spicy, heavy foods. If nausea and/or vomiting occur, drink only clear liquids until the nausea and/or  vomiting subsides. Call your physician if vomiting continues.  Special Instructions/Symptoms: Your throat may feel dry or sore from the anesthesia or the breathing tube placed in your throat during surgery. If this causes discomfort, gargle with warm salt water. The discomfort should disappear within 24 hours.  If you had a scopolamine  patch placed behind your ear for the management of post- operative nausea and/or vomiting:  1. The medication in the patch is effective for 72 hours, after which it should be removed.  Wrap patch in a tissue and discard in the trash. Wash hands thoroughly with soap and water. 2. You may remove the patch earlier than 72 hours if you experience unpleasant side effects which may include dry mouth, dizziness or visual disturbances. 3. Avoid touching the patch. Wash your hands with soap and water after contact with the patch.     You had 1000mg  of Acetaminophen  at 1:15pm today. Next dose after 7:15pm. You had 5mg  of Oxycodone  at 1:25pm today.

## 2023-12-24 ENCOUNTER — Encounter (HOSPITAL_BASED_OUTPATIENT_CLINIC_OR_DEPARTMENT_OTHER): Payer: Self-pay | Admitting: Orthopedic Surgery

## 2024-01-04 ENCOUNTER — Ambulatory Visit: Attending: Orthopedic Surgery | Admitting: Occupational Therapy

## 2024-01-04 ENCOUNTER — Other Ambulatory Visit: Payer: Self-pay

## 2024-01-04 ENCOUNTER — Encounter: Payer: Self-pay | Admitting: Occupational Therapy

## 2024-01-04 ENCOUNTER — Ambulatory Visit: Admitting: Occupational Therapy

## 2024-01-04 DIAGNOSIS — R29898 Other symptoms and signs involving the musculoskeletal system: Secondary | ICD-10-CM

## 2024-01-04 DIAGNOSIS — M25542 Pain in joints of left hand: Secondary | ICD-10-CM | POA: Diagnosis present

## 2024-01-04 DIAGNOSIS — R208 Other disturbances of skin sensation: Secondary | ICD-10-CM

## 2024-01-04 DIAGNOSIS — R600 Localized edema: Secondary | ICD-10-CM | POA: Diagnosis present

## 2024-01-04 DIAGNOSIS — M25642 Stiffness of left hand, not elsewhere classified: Secondary | ICD-10-CM

## 2024-01-04 DIAGNOSIS — R278 Other lack of coordination: Secondary | ICD-10-CM

## 2024-01-04 DIAGNOSIS — M6281 Muscle weakness (generalized): Secondary | ICD-10-CM | POA: Diagnosis present
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
## 2024-01-10 ENCOUNTER — Telehealth: Payer: Self-pay | Admitting: Occupational Therapy

## 2024-01-10 ENCOUNTER — Ambulatory Visit: Payer: Self-pay | Admitting: Occupational Therapy

## 2024-01-10 NOTE — Telephone Encounter (Addendum)
 This is to document my attempt to call patient after no-show for OT appt today at 845 AM.  This is patient's # 2nd missed appt.  OTR called around 9AM   Home phone number(s) was used in efforts to contact the patient.   Voice mail was left requesting the patient call the clinic back at [phone] to try and come for a later AM appointment today as OTR had a cancellation on her schedule.  No future appts are scheduled at this time.    No return call received, ~ 12 PM supervisor called MD office and spoke with Archibald Beard re: missed appt.  At this time, we will await pt to reach out for future appts with plans to only schedule 1 appt at a time if she reaches out to this office.

## 2024-05-26 ENCOUNTER — Encounter: Payer: Self-pay | Admitting: Radiology

## 2024-06-10 ENCOUNTER — Encounter: Payer: Self-pay | Admitting: *Deleted

## 2024-06-10 NOTE — Progress Notes (Signed)
 Pt had [facility] appt on 06/18/23 where she met with Lauraine Kitty, NP and health equity team CHW and noted food, housing and transportation insecurities for which the CHW gave pt resources at the time, including info related to rental assistnace and CHW obtained pt's consent for referral to [facility] referral, which was made for rental and utility assistance. This health equity team member unable to contact pt to determine if she did receive [facility] support or had ongoing SDOH needs. Current chart review indicates pt has f/u care at the [facility] clinic as well as Care coordination outreach 06/26/23. Chart review also reveals pt was admitted for orthopedic surgery 12/21/23 but no notes visible about SDOH need updates or further appt with [facility]; [facility] listed as her PCP still. Since Health equity team unable to contact pt by phone, final letter sent to pt with PCP and food, housing resources and reminder to call her Medicaid plan for transportation to healthcare appt. No additional health equity team support scheduled at this time.

## 2024-08-01 ENCOUNTER — Telehealth: Payer: MEDICAID

## 2024-08-08 ENCOUNTER — Ambulatory Visit: Payer: MEDICAID | Admitting: Sports Medicine

## 2024-08-08 ENCOUNTER — Encounter: Payer: Self-pay | Admitting: Sports Medicine

## 2024-08-08 VITALS — BP 111/74 | HR 75 | Temp 98.0°F | Ht 65.0 in | Wt 121.8 lb

## 2024-08-08 DIAGNOSIS — Z1329 Encounter for screening for other suspected endocrine disorder: Secondary | ICD-10-CM | POA: Diagnosis not present

## 2024-08-08 DIAGNOSIS — Z131 Encounter for screening for diabetes mellitus: Secondary | ICD-10-CM

## 2024-08-08 DIAGNOSIS — M545 Low back pain, unspecified: Secondary | ICD-10-CM | POA: Diagnosis not present

## 2024-08-08 DIAGNOSIS — G629 Polyneuropathy, unspecified: Secondary | ICD-10-CM | POA: Diagnosis not present

## 2024-08-08 DIAGNOSIS — F3163 Bipolar disorder, current episode mixed, severe, without psychotic features: Secondary | ICD-10-CM | POA: Diagnosis not present

## 2024-08-08 DIAGNOSIS — G8929 Other chronic pain: Secondary | ICD-10-CM

## 2024-08-08 DIAGNOSIS — M542 Cervicalgia: Secondary | ICD-10-CM

## 2024-08-08 LAB — CBC WITH DIFFERENTIAL/PLATELET
Basophils Absolute: 0 K/uL (ref 0.0–0.1)
Basophils Relative: 0.3 % (ref 0.0–3.0)
Eosinophils Absolute: 0.1 K/uL (ref 0.0–0.7)
Eosinophils Relative: 1.9 % (ref 0.0–5.0)
HCT: 39.9 % (ref 36.0–46.0)
Hemoglobin: 13.4 g/dL (ref 12.0–15.0)
Lymphocytes Relative: 14.6 % (ref 12.0–46.0)
Lymphs Abs: 1.1 K/uL (ref 0.7–4.0)
MCHC: 33.7 g/dL (ref 30.0–36.0)
MCV: 93.8 fl (ref 78.0–100.0)
Monocytes Absolute: 0.4 K/uL (ref 0.1–1.0)
Monocytes Relative: 5.5 % (ref 3.0–12.0)
Neutro Abs: 6.1 K/uL (ref 1.4–7.7)
Neutrophils Relative %: 77.7 % — ABNORMAL HIGH (ref 43.0–77.0)
Platelets: 191 K/uL (ref 150.0–400.0)
RBC: 4.25 Mil/uL (ref 3.87–5.11)
RDW: 14.1 % (ref 11.5–15.5)
WBC: 7.8 K/uL (ref 4.0–10.5)

## 2024-08-08 LAB — HEMOGLOBIN A1C: Hgb A1c MFr Bld: 4.8 % (ref 4.6–6.5)

## 2024-08-08 LAB — TSH: TSH: 0.84 u[IU]/mL (ref 0.35–5.50)
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
## 2024-08-08 NOTE — Patient Instructions (Signed)

## 2024-08-09 LAB — COMPLETE METABOLIC PANEL WITHOUT GFR
AG Ratio: 1.7 (calc) (ref 1.0–2.5)
ALT: 13 U/L (ref 6–29)
AST: 19 U/L (ref 10–30)
Albumin: 4.2 g/dL (ref 3.6–5.1)
Alkaline phosphatase (APISO): 49 U/L (ref 31–125)
BUN: 12 mg/dL (ref 7–25)
CO2: 26 mmol/L (ref 20–32)
Calcium: 9.1 mg/dL (ref 8.6–10.2)
Chloride: 105 mmol/L (ref 98–110)
Creat: 0.83 mg/dL (ref 0.50–0.97)
Globulin: 2.5 g/dL (ref 1.9–3.7)
Glucose, Bld: 79 mg/dL (ref 65–99)
Potassium: 4.4 mmol/L (ref 3.5–5.3)
Sodium: 138 mmol/L (ref 135–146)
Total Bilirubin: 0.6 mg/dL (ref 0.2–1.2)
Total Protein: 6.7 g/dL (ref 6.1–8.1)

## 2024-08-11 ENCOUNTER — Ambulatory Visit: Payer: Self-pay | Admitting: Sports Medicine

## 2024-08-13 ENCOUNTER — Ambulatory Visit: Payer: MEDICAID | Admitting: Orthopaedic Surgery

## 2024-08-27 ENCOUNTER — Telehealth: Payer: Self-pay

## 2024-08-27 NOTE — Telephone Encounter (Signed)
 Please advise.

## 2024-08-27 NOTE — Telephone Encounter (Signed)
 Copied from CRM [id number]. Topic: General - Other >> Aug 27, 2024  9:35 AM Deleta RAMAN wrote: Reason for CRM: Consuelo from [facility] calling to see if a prior authorization has been completed for patient imaging appointment on 2/10. Prior authorization is needed by. Please contact bonnie at [phone] Fax number [phone]

## 2024-08-28 NOTE — Telephone Encounter (Signed)
 No further action needed.

## 2024-09-02 ENCOUNTER — Other Ambulatory Visit: Payer: MEDICAID

## 2025-02-05 ENCOUNTER — Ambulatory Visit: Payer: MEDICAID | Admitting: Sports Medicine
